# Patient Record
Sex: Male | Born: 1955
Health system: Southern US, Community
[De-identification: ages and names within clinical notes are randomized; demographics above are authoritative.]

## PROBLEM LIST (undated history)

## (undated) DIAGNOSIS — E119 Type 2 diabetes mellitus without complications: Secondary | ICD-10-CM

## (undated) DIAGNOSIS — I10 Essential (primary) hypertension: Secondary | ICD-10-CM

## (undated) DIAGNOSIS — L109 Pemphigus, unspecified: Secondary | ICD-10-CM

## (undated) DIAGNOSIS — E785 Hyperlipidemia, unspecified: Secondary | ICD-10-CM

## (undated) DIAGNOSIS — I2510 Atherosclerotic heart disease of native coronary artery without angina pectoris: Secondary | ICD-10-CM

## (undated) HISTORY — DX: Essential (primary) hypertension: I10

## (undated) HISTORY — DX: Type 2 diabetes mellitus without complications: E11.9

## (undated) HISTORY — DX: Hyperlipidemia, unspecified: E78.5

## (undated) HISTORY — PX: ROTATOR CUFF REPAIR: SHX139

## (undated) HISTORY — PX: TRIGGER FINGER RELEASE: SHX641

---

## 1977-11-14 HISTORY — PX: OTHER SURGICAL HISTORY: SHX169

## 2007-11-28 DIAGNOSIS — Z86018 Personal history of other benign neoplasm: Secondary | ICD-10-CM

## 2007-11-28 HISTORY — DX: Personal history of other benign neoplasm: Z86.018

## 2011-03-11 ENCOUNTER — Ambulatory Visit: Payer: Self-pay | Admitting: Internal Medicine

## 2011-03-15 ENCOUNTER — Ambulatory Visit: Payer: Self-pay | Admitting: Internal Medicine

## 2013-09-05 ENCOUNTER — Ambulatory Visit: Payer: Self-pay | Admitting: Orthopedic Surgery

## 2013-10-29 ENCOUNTER — Ambulatory Visit: Payer: Self-pay | Admitting: Orthopedic Surgery

## 2015-03-06 NOTE — Op Note (Signed)
PATIENT NAME:  RIGGS, DINEEN MR#:  [id number] DATE OF BIRTH:  [birth year]  DATE OF PROCEDURE:  11/01/2013  PREOPERATIVE DIAGNOSIS: Rotator cuff tear, impingement syndrome, biceps tendinitis/fraying right shoulder.   POSTOPERATIVE DIAGNOSIS: Rotator cuff tear, impingement syndrome, biceps tendinitis/fraying right shoulder.  PROCEDURE PERFORMED: Arthroscopic repair of rotator cuff, arthroscopic subacromial decompression and biceps tenotomy.   SURGEON: Dawayne Patricia, M.D.   ASSISTANT: Reche Dixon.   ANESTHESIA: Interscalene block and general.   ESTIMATED BLOOD LOSS: Minimal.   OPERATIVE FINDINGS: Full-thickness tear of the supraspinatus from the proximal edge of the rotator interval, subacromial spur, extensive bursitis, significant biceps tendinitis/fraying.  DRAINS: None.  IMPLANTS: Arthrex.   COMPLICATIONS: None.   INDICATIONS FOR PROCEDURE: This patient is a 59 year old gentleman who presented to my office with complaints of significant pain in the right shoulder and progressive weakness. MRI confirmed the presence of a significant tear of the anterior rotator cuff, with medial and posterior retraction. Risks and benefits were explained to the patient. The patient decided to proceed with surgical intervention.   DESCRIPTION OF PROCEDURE: The patient was identified in the preoperative holding area. Right shoulder was marked as the operative site. Interscalene block was administered. The patient was brought into the Operating Room and placed on the table in a supine position. General anesthesia was administered. The patient was secured in a beach chair position with head adequately positioned and secured. Adequate padding was provided.   The right upper extremity was prepared and draped in the usual sterile fashion. Timeout was performed.   Standard posterior viewing portal was made. Arthroscope was inserted and, under direct visualization, an anterior portal was made. The  patient was found to have very severe fraying of the biceps tendon, with significant injection of the tendon pulled into the shoulder. Biceps tenotomy was carried out, and the residual stump was trimmed back to the level of the labrum. The patient was found to have very significant fraying of the entire supraspinatus. Probe was readily able to be passed directly through the supraspinatus into the subacromial space. On motion of the arm, full-thickness tear was appreciated, with posterior and slight medial retraction. Subscapularis was intact and probed for security.   Gentle debridement of the torn rotator cuff was carried out. Anterior capsule was gently debrided of the extensive scar tissue that was noted. At this time, attention was turned to the subacromial space. The subacromial space demonstrated very significant bursitis. An extensive bursectomy was carried out. Subacromial decompression with acromioplasty was done, clearing adequate space for rotator cuff repair. The lateral edge of the rotator cuff tear was gently debrided of soft tissue. The humeral footprint was cleared of soft tissue, and a bleeding bone surface was achieved. At this time, a BioComposite corkscrew, 5.5 x 14.7 mm, was inserted into the anterior aspect of the medial footprint. This anchor was double loaded, and all four limbs of suture were passed through the anterior two thirds of the torn cuff. Sutures were shuttled through the anterior portal.   In a similar fashion, a second BioComposite corkscrew anchor was placed in the posterior aspect of the medial footprint. In the remaining one third of the torn portion of the tendon, one of the two suture limbs were passed and the other was pulled. The sutures were shuttled back to the lateral portal in pairs and carefully tied down. The rotator cuff very nicely reduced back to the footprint.   Decision was made to implant a BioComposite SwiveLock 4.75 mm x 19.1 mm  anchor into the lateral  aspect of the footprint to secure down the lateral edge of the cuff tear. All six sutures were passed through this SwiveLock anchor. The cuff was nicely laid down. At this time, the shoulder was taken through a range of motion to ensure security and complete repair of rotator cuff. The shoulder was thoroughly irrigated and drained. Arthroscope was removed. Skin was closed using 2-0 Vicryl and 3-0 nylon suture. Sterile dressings were applied. The patient was placed in a sling with an abduction pillow. He will follow up in my office in three days.    ____________________________ Dawayne Patricia, MD sr:cg D: 11/01/2013 16:38:39 ET T: 11/02/2013 01:22:00 ET JOB#: [id number]  cc: Dawayne Patricia, MD, <Dictator>

## 2015-03-07 NOTE — Op Note (Signed)
PATIENT NAME:  Hector Garcia, Hector Garcia MR#:  [id number] DATE OF BIRTH:  1955-12-06  DATE OF PROCEDURE:  10/29/2013  PREOPERATIVE DIAGNOSIS: Rotator cuff tear, impingement syndrome, biceps tendinitis/fraying right shoulder.   POSTOPERATIVE DIAGNOSIS: Rotator cuff tear, impingement syndrome, biceps tendinitis/fraying right shoulder.  PROCEDURE PERFORMED: Arthroscopic repair of rotator cuff, arthroscopic subacromial decompression and biceps tenotomy.   SURGEON: Dawayne Patricia, M.D.   ASSISTANT: Reche Dixon.   ANESTHESIA: Interscalene block and general.   ESTIMATED BLOOD LOSS: Minimal.   OPERATIVE FINDINGS: Full-thickness tear of the supraspinatus from the proximal edge of the rotator interval, subacromial spur, extensive bursitis, significant biceps tendinitis/fraying.  DRAINS: None.  IMPLANTS: Arthrex.   COMPLICATIONS: None.   INDICATIONS FOR PROCEDURE: This patient is a 59 year old gentleman who presented to my office with complaints of significant pain in the right shoulder and progressive weakness. MRI confirmed the presence of a significant tear of the anterior rotator cuff, with medial and posterior retraction. Risks and benefits were explained to the patient. The patient decided to proceed with surgical intervention.   DESCRIPTION OF PROCEDURE: The patient was identified in the preoperative holding area. Right shoulder was marked as the operative site. Interscalene block was administered. The patient was brought into the Operating Room and placed on the table in a supine position. General anesthesia was administered. The patient was secured in a beach chair position with head adequately positioned and secured. Adequate padding was provided.   The right upper extremity was prepared and draped in the usual sterile fashion. Timeout was performed.   Standard posterior viewing portal was made. Arthroscope was inserted and, under direct visualization, an anterior portal was made. The  patient was found to have very severe fraying of the biceps tendon, with significant injection of the tendon pulled into the shoulder. Biceps tenotomy was carried out, and the residual stump was trimmed back to the level of the labrum. The patient was found to have very significant fraying of the entire supraspinatus. Probe was readily able to be passed directly through the supraspinatus into the subacromial space. On motion of the arm, full-thickness tear was appreciated, with posterior and slight medial retraction. Subscapularis was intact and probed for security.   Gentle debridement of the torn rotator cuff was carried out. Anterior capsule was gently debrided of the extensive scar tissue that was noted. At this time, attention was turned to the subacromial space. The subacromial space demonstrated very significant bursitis. An extensive bursectomy was carried out. Subacromial decompression with acromioplasty was done, clearing adequate space for rotator cuff repair. The lateral edge of the rotator cuff tear was gently debrided of soft tissue. The humeral footprint was cleared of soft tissue, and a bleeding bone surface was achieved. At this time, a BioComposite corkscrew, 5.5 x 14.7 mm, was inserted into the anterior aspect of the medial footprint. This anchor was double loaded, and all four limbs of suture were passed through the anterior two thirds of the torn cuff. Sutures were shuttled through the anterior portal.   In a similar fashion, a second BioComposite corkscrew anchor was placed in the posterior aspect of the medial footprint. In the remaining one third of the torn portion of the tendon, one of the two suture limbs were passed and the other was pulled. The sutures were shuttled back to the lateral portal in pairs and carefully tied down. The rotator cuff very nicely reduced back to the footprint.   Decision was made to implant a BioComposite SwiveLock 4.75 mm x 19.1 mm  anchor into the lateral  aspect of the footprint to secure down the lateral edge of the cuff tear. All six sutures were passed through this SwiveLock anchor. The cuff was nicely laid down. At this time, the shoulder was taken through a range of motion to ensure security and complete repair of rotator cuff. The shoulder was thoroughly irrigated and drained. Arthroscope was removed. Skin was closed using 2-0 Vicryl and 3-0 nylon suture. Sterile dressings were applied. The patient was placed in a sling with an abduction pillow. He will follow up in my office in three days.  ____________________________ Dawayne Patricia, MD sr:cg D: 11/01/2013 16:38:00 ET T: 11/02/2013 01:22:00 ET JOB#: [id number] Dawayne Patricia MD ELECTRONICALLY SIGNED 12/05/2013 9:23

## 2016-06-03 ENCOUNTER — Ambulatory Visit: Admission: RE | Admit: 2016-06-03 | Payer: 59 | Source: Ambulatory Visit | Admitting: Gastroenterology

## 2016-06-03 ENCOUNTER — Encounter: Admission: RE | Payer: Self-pay | Source: Ambulatory Visit

## 2016-06-03 SURGERY — COLONOSCOPY WITH PROPOFOL
Anesthesia: General

## 2016-07-20 DIAGNOSIS — I10 Essential (primary) hypertension: Secondary | ICD-10-CM | POA: Insufficient documentation

## 2016-07-20 DIAGNOSIS — E119 Type 2 diabetes mellitus without complications: Secondary | ICD-10-CM | POA: Insufficient documentation

## 2016-07-20 DIAGNOSIS — E782 Mixed hyperlipidemia: Secondary | ICD-10-CM | POA: Insufficient documentation

## 2016-10-18 ENCOUNTER — Other Ambulatory Visit: Payer: Self-pay | Admitting: Internal Medicine

## 2016-10-18 DIAGNOSIS — M5116 Intervertebral disc disorders with radiculopathy, lumbar region: Secondary | ICD-10-CM

## 2016-10-21 ENCOUNTER — Ambulatory Visit: Admission: RE | Admit: 2016-10-21 | Payer: 59 | Source: Ambulatory Visit

## 2016-11-04 ENCOUNTER — Ambulatory Visit
Admission: RE | Admit: 2016-11-04 | Discharge: 2016-11-04 | Disposition: A | Discharge status: Home / Self Care | Payer: 59 | Source: Ambulatory Visit | Attending: Internal Medicine | Admitting: Internal Medicine

## 2016-11-04 DIAGNOSIS — M4807 Spinal stenosis, lumbosacral region: Secondary | ICD-10-CM | POA: Diagnosis not present

## 2016-11-04 DIAGNOSIS — M8938 Hypertrophy of bone, other site: Secondary | ICD-10-CM | POA: Insufficient documentation

## 2016-11-04 DIAGNOSIS — M5116 Intervertebral disc disorders with radiculopathy, lumbar region: Secondary | ICD-10-CM | POA: Diagnosis not present

## 2016-11-04 DIAGNOSIS — M5127 Other intervertebral disc displacement, lumbosacral region: Secondary | ICD-10-CM | POA: Insufficient documentation

## 2016-11-15 DIAGNOSIS — M5416 Radiculopathy, lumbar region: Secondary | ICD-10-CM | POA: Diagnosis not present

## 2017-01-13 DIAGNOSIS — R972 Elevated prostate specific antigen [PSA]: Secondary | ICD-10-CM | POA: Diagnosis not present

## 2017-01-13 DIAGNOSIS — E119 Type 2 diabetes mellitus without complications: Secondary | ICD-10-CM | POA: Diagnosis not present

## 2017-01-26 DIAGNOSIS — Z0000 Encounter for general adult medical examination without abnormal findings: Secondary | ICD-10-CM | POA: Diagnosis not present

## 2017-01-26 DIAGNOSIS — D369 Benign neoplasm, unspecified site: Secondary | ICD-10-CM | POA: Insufficient documentation

## 2017-01-26 DIAGNOSIS — E119 Type 2 diabetes mellitus without complications: Secondary | ICD-10-CM | POA: Diagnosis not present

## 2017-01-26 DIAGNOSIS — M519 Unspecified thoracic, thoracolumbar and lumbosacral intervertebral disc disorder: Secondary | ICD-10-CM | POA: Insufficient documentation

## 2017-03-21 ENCOUNTER — Encounter: Payer: Self-pay | Admitting: Urology

## 2017-03-21 ENCOUNTER — Ambulatory Visit: Payer: 59 | Admitting: Urology

## 2017-03-21 VITALS — BP 171/73 | HR 67 | Ht 73.0 in | Wt 227.6 lb

## 2017-03-21 DIAGNOSIS — N5201 Erectile dysfunction due to arterial insufficiency: Secondary | ICD-10-CM

## 2017-03-21 NOTE — Progress Notes (Signed)
03/21/2017 3:43 PM   Belva Bertin Jan 15, 1956 [id number]  Referring provider: Rusty Aus, MD [facility] [place], [place] [address]  Chief Complaint  Patient presents with  . Erectile Dysfunction    HPI: 61 year old male who presents today for further evaluation and management of erectile dysfunction. The patient states that he is concerned with erectile dysfunction for the last several years. He has tried both Viagra 100 mg and Cialis 20 mg many different times and has noted progressively worsening dysfunction. He has a history of diabetes, hypertension, and tobacco abuse, contributing to his symptoms. The patient states that he has a strong libido, has normal energy level, and no stigmata of low testosterone. Para the patient has done research on alternative options as well versed on a different modalities of erectile dysfunction management.     PMH: Past Medical History:  Diagnosis Date  . Diabetes mellitus without complication ([place])   . Hyperlipidemia   . Hypertension     Surgical History: Past Surgical History:  Procedure Laterality Date  . ROTATOR CUFF REPAIR    . TRIGGER FINGER RELEASE      Home Medications:  Allergies as of 03/21/2017   No Known Allergies     Medication List       Accurate as of 03/21/17  3:43 PM. Always use your most recent med list.          amLODipine 10 MG tablet Commonly known as:  NORVASC Take by mouth.   atorvastatin 20 MG tablet Commonly known as:  LIPITOR Take by mouth.   CHANTIX 0.5 MG tablet Generic drug:  varenicline Take by mouth.   DOCOSAHEXAENOIC ACID PO Take by mouth.   glimepiride 2 MG tablet Commonly known as:  AMARYL Take by mouth.   metFORMIN 500 MG tablet Commonly known as:  GLUCOPHAGE TAKE 2 TABLETS BY MOUTH 2 TIMES DAILY WITH MEALS.   omeprazole 40 MG capsule Commonly known as:  PRILOSEC Take by mouth.   zolpidem 10 MG tablet Commonly known as:   AMBIEN TAKE 1 TABLET BY MOUTH NIGHTLY AS NEEDED FOR SLEEP       Allergies: No Known Allergies  Family History: Family History  Problem Relation Age of Onset  . Prostate cancer Neg Hx   . Bladder Cancer Neg Hx   . Kidney cancer Neg Hx     Social History:  reports that he has quit smoking. He has never used smokeless tobacco. He reports that he drinks alcohol. He reports that he does not use drugs.  ROS: UROLOGY Frequent Urination?: No Hard to postpone urination?: No Burning/pain with urination?: No Get up at night to urinate?: No Leakage of urine?: No Urine stream starts and stops?: Yes Trouble starting stream?: Yes Do you have to strain to urinate?: No Blood in urine?: No Urinary tract infection?: No Sexually transmitted disease?: No Injury to kidneys or bladder?: No Painful intercourse?: No Weak stream?: No Erection problems?: No  Gastrointestinal Nausea?: No Vomiting?: No Indigestion/heartburn?: No Diarrhea?: No Constipation?: No  Constitutional Fever: No Night sweats?: No Weight loss?: No Fatigue?: No  Skin Skin rash/lesions?: No Itching?: No  Eyes Blurred vision?: No Double vision?: No  Ears/Nose/Throat Sore throat?: No Sinus problems?: Yes  Hematologic/Lymphatic Swollen glands?: No Easy bruising?: No  Cardiovascular Leg swelling?: No Chest pain?: No  Respiratory Cough?: No Shortness of breath?: No  Endocrine Excessive thirst?: No  Musculoskeletal Back pain?: No Joint pain?: No  Neurological Headaches?: No Dizziness?:  No  Psychologic Depression?: No Anxiety?: No  Physical Exam: BP (!) 171/73 (BP Location: Left Arm, Patient Position: Sitting, Cuff Size: Normal)   Pulse 67   Ht 6\' 1"  (1.854 m)   Wt 103.2 kg (227 lb 9.6 oz)   BMI 30.03 kg/m   Constitutional:  Alert and oriented, No acute distress. HEENT: [place] AT, moist mucus membranes.  Trachea midline, no masses. Cardiovascular: No clubbing, cyanosis, or  edema. Respiratory: Normal respiratory effort, no increased work of breathing. GI: Abdomen is soft, nontender, nondistended, no abdominal masses GU: No CVA tenderness. Skin: No rashes, bruises or suspicious lesions. Lymph: No cervical or inguinal adenopathy. Neurologic: Grossly intact, no focal deficits, moving all 4 extremities. Psychiatric: Normal mood and affect.  Laboratory Data: No results found for: WBC, HGB, HCT, MCV, PLT  No results found for: CREATININE  No results found for: PSA  No results found for: TESTOSTERONE  No results found for: HGBA1C  Urinalysis No results found for: COLORURINE, APPEARANCEUR, LABSPEC, PHURINE, GLUCOSEU, HGBUR, BILIRUBINUR, KETONESUR, PROTEINUR, UROBILINOGEN, NITRITE, LEUKOCYTESUR  Pertinent Imaging: None  Assessment & Plan:  The patient has erectile dysfunction likely of arterial genic origin. We discussed the treatment options for him including Viagra/Cialis with a penile occlusive ring, vacuum erection device, Muse intraurethral suppository, and intra-corporal injections. We discussed the pros and cons of each modality. I told him that the most effective and cheapest option of all of these would likely be the injections. I went over the process of titrating the medication and the general procedure for injections. We discussed the risks of priapism. Our plan at this point is to have the patient pick up a prescription of tri-mix and return for injection teaching. We'll then plan to follow up with him on annual basis, sooner if he needs further help persistence.  There are no diagnoses linked to this encounter.  Return in about 1 week (around 03/28/2017) for injection teaching with [place].  Ardis Hughs, [facility] [address], [place],  [address] [phone]

## 2017-03-27 NOTE — Progress Notes (Signed)
03/29/2017 9:10 AM   Hector Garcia 03-Jul-1956 [id number]  Referring provider: Rusty Aus, MD [facility] [place], [place] [address]  Chief Complaint  Patient presents with  . Erectile Dysfunction    Trimix teaching    HPI: [name] who presents today for a Trimix titration.  Background history 61 year old male who presents today for further evaluation and management of erectile dysfunction. The patient states that he is concerned with erectile dysfunction for the last several years. He has tried both Viagra 100 mg and Cialis 20 mg many different times and has noted progressively worsening dysfunction. He has a history of diabetes, hypertension, and tobacco abuse, contributing to his symptoms. The patient states that he has a strong libido, has normal energy level, and no stigmata of low testosterone. Para the patient has done research on alternative options as well versed on a different modalities of erectile dysfunction management.    Today, his BP was elevated.     PMH: Past Medical History:  Diagnosis Date  . Diabetes mellitus without complication ([place])   . Hyperlipidemia   . Hypertension     Surgical History: Past Surgical History:  Procedure Laterality Date  . ROTATOR CUFF REPAIR    . TRIGGER FINGER RELEASE      Home Medications:  Allergies as of 03/29/2017   No Known Allergies     Medication List       Accurate as of 03/29/17  9:10 AM. Always use your most recent med list.          amLODipine 10 MG tablet Commonly known as:  NORVASC Take by mouth.   atorvastatin 20 MG tablet Commonly known as:  LIPITOR Take by mouth.   CHANTIX 0.5 MG tablet Generic drug:  varenicline Take by mouth.   DOCOSAHEXAENOIC ACID PO Take by mouth.   glimepiride 2 MG tablet Commonly known as:  AMARYL Take by mouth.   metFORMIN 500 MG tablet Commonly known as:  GLUCOPHAGE TAKE 2 TABLETS BY MOUTH 2 TIMES DAILY WITH  MEALS.   omeprazole 40 MG capsule Commonly known as:  PRILOSEC Take by mouth.   zolpidem 10 MG tablet Commonly known as:  AMBIEN TAKE 1 TABLET BY MOUTH NIGHTLY AS NEEDED FOR SLEEP       Allergies: No Known Allergies  Family History: Family History  Problem Relation Age of Onset  . Prostate cancer Neg Hx   . Bladder Cancer Neg Hx   . Kidney cancer Neg Hx     Social History:  reports that he quit smoking about 4 months ago. He has never used smokeless tobacco. He reports that he drinks alcohol. He reports that he does not use drugs.  ROS: UROLOGY Frequent Urination?: No Hard to postpone urination?: No Burning/pain with urination?: No Get up at night to urinate?: No Leakage of urine?: No Urine stream starts and stops?: No Trouble starting stream?: No Do you have to strain to urinate?: No Blood in urine?: No Urinary tract infection?: No Sexually transmitted disease?: No Injury to kidneys or bladder?: No Painful intercourse?: No Weak stream?: No Erection problems?: Yes Penile pain?: No  Gastrointestinal Nausea?: No Vomiting?: No Indigestion/heartburn?: No Diarrhea?: No Constipation?: No  Constitutional Fever: No Night sweats?: No Weight loss?: No Fatigue?: No  Skin Skin rash/lesions?: No Itching?: No  Eyes Blurred vision?: No Double vision?: No  Ears/Nose/Throat Sore throat?: No Sinus problems?: No  Hematologic/Lymphatic Swollen glands?: No Easy bruising?: No  Cardiovascular  Leg swelling?: No Chest pain?: No  Respiratory Cough?: No Shortness of breath?: No  Endocrine Excessive thirst?: No  Musculoskeletal Back pain?: No Joint pain?: No  Neurological Headaches?: No Dizziness?: No  Psychologic Depression?: No Anxiety?: No  Physical Exam: BP (!) 194/95 (BP Location: Left Arm, Cuff Size: Normal)   Pulse 79   Ht 6\' 1"  (1.854 m)   Wt 226 lb 1.6 oz (102.6 kg)   BMI 29.83 kg/m   Constitutional: Well nourished. Alert and  oriented, No acute distress. HEENT: [place] AT, moist mucus membranes. Trachea midline, no masses. Cardiovascular: No clubbing, cyanosis, or edema. Respiratory: Normal respiratory effort, no increased work of breathing. Skin: No rashes, bruises or suspicious lesions. Lymph: No cervical or inguinal adenopathy. Neurologic: Grossly intact, no focal deficits, moving all 4 extremities. Psychiatric: Normal mood and affect.     Assessment & Plan  1. Erectile dysfunction  - Patient could not receive Trimix injection today due to uncontrolled hypertension  - Patient will return tomorrow morning for second attempt  2. Uncontrolled HTN  - Patient will return to his primary care provider to see if his blood pressure medication can be increased so that we can safely administered Trimix injection  Return for RTC tomorrow am for Trimix titration.  Zara Council, [facility] [address] [place], [place] [address] [phone]

## 2017-03-29 ENCOUNTER — Ambulatory Visit (INDEPENDENT_AMBULATORY_CARE_PROVIDER_SITE_OTHER): Payer: 59 | Admitting: Urology

## 2017-03-29 ENCOUNTER — Encounter: Payer: Self-pay | Admitting: Urology

## 2017-03-29 VITALS — BP 194/95 | HR 79 | Ht 73.0 in | Wt 226.1 lb

## 2017-03-29 DIAGNOSIS — N529 Male erectile dysfunction, unspecified: Secondary | ICD-10-CM

## 2017-03-29 DIAGNOSIS — I10 Essential (primary) hypertension: Secondary | ICD-10-CM

## 2017-03-30 ENCOUNTER — Ambulatory Visit (INDEPENDENT_AMBULATORY_CARE_PROVIDER_SITE_OTHER): Payer: 59 | Admitting: Urology

## 2017-03-30 ENCOUNTER — Encounter: Payer: Self-pay | Admitting: Urology

## 2017-03-30 VITALS — BP 202/87 | HR 71 | Ht 68.0 in | Wt 226.5 lb

## 2017-03-30 DIAGNOSIS — I10 Essential (primary) hypertension: Secondary | ICD-10-CM | POA: Diagnosis not present

## 2017-03-30 DIAGNOSIS — N529 Male erectile dysfunction, unspecified: Secondary | ICD-10-CM

## 2017-03-30 NOTE — Progress Notes (Addendum)
03/30/2017 8:54 AM   Hector Garcia July 27, 1956 [id number]  Referring provider: Rusty Aus, MD [facility] [place], [place] [address]  Chief Complaint  Patient presents with  . Erectile Dysfunction    Trimix    HPI: [name] who presents today for a Trimix titration.  Background history 61 year old male who presents today for further evaluation and management of erectile dysfunction. The patient states that he is concerned with erectile dysfunction for the last several years. He has tried both Viagra 100 mg and Cialis 20 mg many different times and has noted progressively worsening dysfunction. He has a history of diabetes, hypertension, and tobacco abuse, contributing to his symptoms. The patient states that he has a strong libido, has normal energy level, and no stigmata of low testosterone. Para the patient has done research on alternative options as well versed on a different modalities of erectile dysfunction management.    Today, patient still with high BP.  He was unable to get in contact with his PCP.     PMH: Past Medical History:  Diagnosis Date  . Diabetes mellitus without complication ([place])   . Hyperlipidemia   . Hypertension     Surgical History: Past Surgical History:  Procedure Laterality Date  . ROTATOR CUFF REPAIR    . TRIGGER FINGER RELEASE      Home Medications:  Allergies as of 03/30/2017   No Known Allergies     Medication List       Accurate as of 03/30/17  8:54 AM. Always use your most recent med list.          amLODipine 10 MG tablet Commonly known as:  NORVASC Take by mouth.   atorvastatin 20 MG tablet Commonly known as:  LIPITOR Take by mouth.   CHANTIX 0.5 MG tablet Generic drug:  varenicline Take by mouth.   DOCOSAHEXAENOIC ACID PO Take by mouth.   glimepiride 2 MG tablet Commonly known as:  AMARYL Take by mouth.   metFORMIN 500 MG tablet Commonly known as:   GLUCOPHAGE TAKE 2 TABLETS BY MOUTH 2 TIMES DAILY WITH MEALS.   omeprazole 40 MG capsule Commonly known as:  PRILOSEC Take by mouth.   zolpidem 10 MG tablet Commonly known as:  AMBIEN TAKE 1 TABLET BY MOUTH NIGHTLY AS NEEDED FOR SLEEP       Allergies: No Known Allergies  Family History: Family History  Problem Relation Age of Onset  . Prostate cancer Neg Hx   . Bladder Cancer Neg Hx   . Kidney cancer Neg Hx     Social History:  reports that he quit smoking about 4 months ago. He has never used smokeless tobacco. He reports that he drinks alcohol. He reports that he does not use drugs.   Physical Exam: BP (!) 202/87   Pulse 71   Ht 5\' 8"  (1.727 m)   Wt 226 lb 8 oz (102.7 kg)   BMI 34.44 kg/m   Constitutional: Well nourished. Alert and oriented, No acute distress. HEENT: [place] AT, moist mucus membranes. Trachea midline, no masses. Cardiovascular: No clubbing, cyanosis, or edema. Respiratory: Normal respiratory effort, no increased work of breathing. Skin: No rashes, bruises or suspicious lesions. Lymph: No cervical or inguinal adenopathy. Neurologic: Grossly intact, no focal deficits, moving all 4 extremities. Psychiatric: Normal mood and affect.      Assessment & Plan  1. Erectile dysfunction  - Patient could not receive Trimix injection today due to uncontrolled  hypertension  - Patient advised to contact PCP's office for further management  2. Uncontrolled HTN  - Patient will return to his primary care provider   Return for RTC once HTN is controlled .  Zara Council, [facility] [address], [place], [place] [address] [phone]

## 2017-03-31 DIAGNOSIS — I10 Essential (primary) hypertension: Secondary | ICD-10-CM | POA: Diagnosis not present

## 2017-04-06 DIAGNOSIS — J020 Streptococcal pharyngitis: Secondary | ICD-10-CM | POA: Diagnosis not present

## 2017-04-20 DIAGNOSIS — L570 Actinic keratosis: Secondary | ICD-10-CM | POA: Diagnosis not present

## 2017-04-20 DIAGNOSIS — Z1283 Encounter for screening for malignant neoplasm of skin: Secondary | ICD-10-CM | POA: Diagnosis not present

## 2017-04-20 DIAGNOSIS — Z85828 Personal history of other malignant neoplasm of skin: Secondary | ICD-10-CM | POA: Diagnosis not present

## 2017-04-20 DIAGNOSIS — D485 Neoplasm of uncertain behavior of skin: Secondary | ICD-10-CM | POA: Diagnosis not present

## 2017-04-25 DIAGNOSIS — R42 Dizziness and giddiness: Secondary | ICD-10-CM | POA: Diagnosis not present

## 2017-04-25 DIAGNOSIS — E860 Dehydration: Secondary | ICD-10-CM | POA: Diagnosis not present

## 2017-04-25 DIAGNOSIS — E11649 Type 2 diabetes mellitus with hypoglycemia without coma: Secondary | ICD-10-CM | POA: Diagnosis not present

## 2017-04-25 DIAGNOSIS — R61 Generalized hyperhidrosis: Secondary | ICD-10-CM | POA: Diagnosis not present

## 2017-05-07 NOTE — Progress Notes (Signed)
05/08/2017 8:39 AM   Hector Garcia Nov 23, 1955 [id number]  Referring provider: Rusty Aus, MD [facility] [place], [place] [address]  Chief Complaint  Patient presents with  . Erectile Dysfunction    Trimix teaching    HPI: [name] who presents today for a Trimix titration.  Background history 61 year old male who presents today for further evaluation and management of erectile dysfunction. The patient states that he is concerned with erectile dysfunction for the last several years. He has tried both Viagra 100 mg and Cialis 20 mg many different times and has noted progressively worsening dysfunction. He has a history of diabetes, hypertension, and tobacco abuse, contributing to his symptoms. The patient states that he has a strong libido, has normal energy level, and no stigmata of low testosterone. Para the patient has done research on alternative options as well versed on a different modalities of erectile dysfunction management.    He has seen Dr. Emily Filbert concerning his BP.   His BP with [place] today 178/78.  He states he walked his dog this morning.  I had the patient leave the office and return an hour later. Blood pressure was still elevated and it was not safe to pursue Trimix titration at this visit   PMH: Past Medical History:  Diagnosis Date  . Diabetes mellitus without complication ([place])   . Hyperlipidemia   . Hypertension     Surgical History: Past Surgical History:  Procedure Laterality Date  . ROTATOR CUFF REPAIR    . TRIGGER FINGER RELEASE     x several    Home Medications:  Allergies as of 05/08/2017   No Known Allergies     Medication List       Accurate as of 05/08/17  8:39 AM. Always use your most recent med list.          amLODipine 10 MG tablet Commonly known as:  NORVASC Take by mouth.   atorvastatin 20 MG tablet Commonly known as:  LIPITOR Take by mouth.   DOCOSAHEXAENOIC ACID  PO Take by mouth.   Flaxseed Oil Oil Use.   glimepiride 2 MG tablet Commonly known as:  AMARYL Take by mouth.   loratadine 10 MG tablet Commonly known as:  CLARITIN Take by mouth.   metFORMIN 500 MG tablet Commonly known as:  GLUCOPHAGE TAKE 2 TABLETS BY MOUTH 2 TIMES DAILY WITH MEALS.   omeprazole 40 MG capsule Commonly known as:  PRILOSEC Take by mouth.   oxymetazoline 0.05 % nasal spray Commonly known as:  AFRIN by Nasal route.   sildenafil 100 MG tablet Commonly known as:  VIAGRA TAKE 1 TABLET BY MOUTH DAILY   zolpidem 10 MG tablet Commonly known as:  AMBIEN TAKE 1 TABLET BY MOUTH NIGHTLY AS NEEDED FOR SLEEP       Allergies: No Known Allergies  Family History: Family History  Problem Relation Age of Onset  . Benign prostatic hyperplasia Brother   . Prostate cancer Neg Hx   . Bladder Cancer Neg Hx   . Kidney cancer Neg Hx     Social History:  reports that he quit smoking about 5 months ago. He has never used smokeless tobacco. He reports that he drinks alcohol. He reports that he does not use drugs.   Physical Exam: BP (!) 188/79   Pulse 87   Ht 6\' 1"  (1.854 m)   Wt 233 lb 6.4 oz (105.9 kg)   BMI 30.79 kg/m  Constitutional: Well nourished. Alert and oriented, No acute distress. HEENT: [place] AT, moist mucus membranes. Trachea midline, no masses. Cardiovascular: No clubbing, cyanosis, or edema. Respiratory: Normal respiratory effort, no increased work of breathing. Skin: No rashes, bruises or suspicious lesions. Lymph: No cervical or inguinal adenopathy. Neurologic: Grossly intact, no focal deficits, moving all 4 extremities. Psychiatric: Normal mood and affect.   Assessment & Plan  1. Erectile dysfunction  - patient could not have Trimix titration due to elevated HTN   - Advised patient of the condition of priapism, painful erection lasting for more than four hours, and to contact the office immediately or seek treatment in the ED  2.  Uncontrolled HTN  - Patient will return to his primary care provider   No Follow-up on file.  Zara Council, [facility] [address], [place], [place] [address] [phone]

## 2017-05-08 ENCOUNTER — Ambulatory Visit (INDEPENDENT_AMBULATORY_CARE_PROVIDER_SITE_OTHER): Payer: 59 | Admitting: Urology

## 2017-05-08 ENCOUNTER — Encounter: Payer: Self-pay | Admitting: Urology

## 2017-05-08 VITALS — BP 179/78 | HR 87 | Ht 73.0 in | Wt 233.4 lb

## 2017-05-08 DIAGNOSIS — N529 Male erectile dysfunction, unspecified: Secondary | ICD-10-CM

## 2017-05-08 DIAGNOSIS — I10 Essential (primary) hypertension: Secondary | ICD-10-CM

## 2017-05-09 DIAGNOSIS — G5601 Carpal tunnel syndrome, right upper limb: Secondary | ICD-10-CM | POA: Diagnosis not present

## 2017-05-09 DIAGNOSIS — I10 Essential (primary) hypertension: Secondary | ICD-10-CM | POA: Diagnosis not present

## 2017-05-11 ENCOUNTER — Telehealth: Payer: Self-pay | Admitting: Urology

## 2017-05-11 NOTE — Telephone Encounter (Signed)
Patient notified and is currently working with Dr. Sabra Heck to resolve his BP

## 2017-05-11 NOTE — Telephone Encounter (Signed)
Please let Hector Garcia know that I have spoken to Dr. Erlene Quan and the best thing to do is work with Dr. Emily Filbert to get his BP under control.

## 2017-05-30 NOTE — Progress Notes (Signed)
05/31/2017 8:49 AM   Hector Garcia [birth year] [id number]  Referring provider: Rusty Aus, MD [facility] [place], [place] [address]  Chief Complaint  Patient presents with  . Erectile Dysfunction    Trimix teaching    HPI: [name] who presents today for a Trimix titration.  Background history 61 year old male who presents today for further evaluation and management of erectile dysfunction. The patient states that he is concerned with erectile dysfunction for the last several years. He has tried both Viagra 100 mg and Cialis 20 mg many different times and has noted progressively worsening dysfunction. He has a history of diabetes, hypertension, and tobacco abuse, contributing to his symptoms. The patient states that he has a strong libido, has normal energy level, and no stigmata of low testosterone. Per the patient has done research on alternative options as well versed on a different modalities of erectile dysfunction management.    Unfortunately, I could not proceed with titration of the tri-mix due to the medication being expired.    PMH: Past Medical History:  Diagnosis Date  . Diabetes mellitus without complication ([place])   . Hyperlipidemia   . Hypertension     Surgical History: Past Surgical History:  Procedure Laterality Date  . fisture surgery  1979  . ROTATOR CUFF REPAIR    . TRIGGER FINGER RELEASE     x several    Home Medications:  Allergies as of 05/31/2017   No Known Allergies     Medication List       Accurate as of 05/31/17  8:49 AM. Always use your most recent med list.          amLODipine 10 MG tablet Commonly known as:  NORVASC Take by mouth.   atorvastatin 20 MG tablet Commonly known as:  LIPITOR Take by mouth.   DOCOSAHEXAENOIC ACID PO Take by mouth.   doxazosin 4 MG tablet Commonly known as:  CARDURA Take by mouth.   Flaxseed Oil Oil Use.   glimepiride 2 MG tablet Commonly  known as:  AMARYL Take by mouth.   loratadine 10 MG tablet Commonly known as:  CLARITIN Take by mouth.   meloxicam 7.5 MG tablet Commonly known as:  MOBIC Take by mouth.   metFORMIN 500 MG tablet Commonly known as:  GLUCOPHAGE TAKE 2 TABLETS BY MOUTH 2 TIMES DAILY WITH MEALS.   omeprazole 40 MG capsule Commonly known as:  PRILOSEC Take by mouth.   oxymetazoline 0.05 % nasal spray Commonly known as:  AFRIN by Nasal route.   sildenafil 100 MG tablet Commonly known as:  VIAGRA TAKE 1 TABLET BY MOUTH DAILY   vitamin C 500 MG tablet Commonly known as:  ASCORBIC ACID Take by mouth.   VITAMIN D-1000 MAX ST 1000 units tablet Generic drug:  Cholecalciferol Take by mouth.   zolpidem 10 MG tablet Commonly known as:  AMBIEN TAKE 1 TABLET BY MOUTH NIGHTLY AS NEEDED FOR SLEEP       Allergies: No Known Allergies  Family History: Family History  Problem Relation Age of Onset  . Benign prostatic hyperplasia Brother   . Prostate cancer Neg Hx   . Bladder Cancer Neg Hx   . Kidney cancer Neg Hx     Social History:  reports that he quit smoking about 6 months ago. He has never used smokeless tobacco. He reports that he drinks alcohol. He reports that he does not use drugs.   Physical Exam: BP  131/73   Pulse 77   Ht 6\' 1"  (1.854 m)   Wt 229 lb 3.2 oz (104 kg)   BMI 30.24 kg/m      Assessment & Plan  1. Erectile dysfunction  - patient could not have Trimix titration due to medication being expired.    - will [facility] for [facility] medication  Return for RTC for Trimix titration.  Zara Council, [facility] [address], [place], [place] [address] [phone]

## 2017-05-31 ENCOUNTER — Ambulatory Visit (INDEPENDENT_AMBULATORY_CARE_PROVIDER_SITE_OTHER): Payer: 59 | Admitting: Urology

## 2017-05-31 ENCOUNTER — Encounter: Payer: Self-pay | Admitting: Urology

## 2017-05-31 VITALS — BP 131/73 | HR 77 | Ht 73.0 in | Wt 229.2 lb

## 2017-05-31 DIAGNOSIS — N529 Male erectile dysfunction, unspecified: Secondary | ICD-10-CM

## 2017-05-31 NOTE — Progress Notes (Signed)
Patient vial of Trimix expired. Cassandra to call [facility] to refill med and appointment will be made next week for Teaching.

## 2017-06-06 NOTE — Progress Notes (Signed)
06/07/2017 11:32 AM   Hector Garcia 17-Jan-1956 [id number]  Referring provider: Rusty Aus, MD [facility] [place], [place] [address]  Chief Complaint  Patient presents with  . Erectile Dysfunction    Trimix injection    HPI: [name] who presents today for a Trimix titration.  Background history 60 year old male who presents today for further evaluation and management of erectile dysfunction. The patient states that he is concerned with erectile dysfunction for the last several years. He has tried both Viagra 100 mg and Cialis 20 mg many different times and has noted progressively worsening dysfunction. He has a history of diabetes, hypertension, and tobacco abuse, contributing to his symptoms. The patient states that he has a strong libido, has normal energy level, and no stigmata of low testosterone. Per the patient has done research on alternative options as well versed on a different modalities of erectile dysfunction management.     He presents today for a Trimix titration.  His questions are answered.  He is ready to proceed.     PMH: Past Medical History:  Diagnosis Date  . Diabetes mellitus without complication ([place])   . Hyperlipidemia   . Hypertension     Surgical History: Past Surgical History:  Procedure Laterality Date  . fisture surgery  1979  . ROTATOR CUFF REPAIR    . TRIGGER FINGER RELEASE     x several    Home Medications:  Allergies as of 06/07/2017   No Known Allergies     Medication List       Accurate as of 06/07/17 11:32 AM. Always use your most recent med list.          amLODipine 10 MG tablet Commonly known as:  NORVASC Take by mouth.   atorvastatin 20 MG tablet Commonly known as:  LIPITOR Take by mouth.   DOCOSAHEXAENOIC ACID PO Take by mouth.   doxazosin 4 MG tablet Commonly known as:  CARDURA Take by mouth.   Flaxseed Oil Oil Use.   glimepiride 2 MG tablet Commonly  known as:  AMARYL Take by mouth.   loratadine 10 MG tablet Commonly known as:  CLARITIN Take by mouth.   meloxicam 7.5 MG tablet Commonly known as:  MOBIC Take by mouth.   metFORMIN 500 MG tablet Commonly known as:  GLUCOPHAGE TAKE 2 TABLETS BY MOUTH 2 TIMES DAILY WITH MEALS.   omeprazole 40 MG capsule Commonly known as:  PRILOSEC Take by mouth.   oxymetazoline 0.05 % nasal spray Commonly known as:  AFRIN by Nasal route.   sildenafil 100 MG tablet Commonly known as:  VIAGRA TAKE 1 TABLET BY MOUTH DAILY   tadalafil 10 MG tablet Commonly known as:  CIALIS Take by mouth.   vitamin C 500 MG tablet Commonly known as:  ASCORBIC ACID Take by mouth.   VITAMIN D-1000 MAX ST 1000 units tablet Generic drug:  Cholecalciferol Take by mouth.   zolpidem 10 MG tablet Commonly known as:  AMBIEN TAKE 1 TABLET BY MOUTH NIGHTLY AS NEEDED FOR SLEEP       Allergies: No Known Allergies  Family History: Family History  Problem Relation Age of Onset  . Benign prostatic hyperplasia Brother   . Prostate cancer Neg Hx   . Bladder Cancer Neg Hx   . Kidney cancer Neg Hx     Social History:  reports that he quit smoking about 6 months ago. He has never used smokeless tobacco. He reports  that he drinks alcohol. He reports that he does not use drugs.   Physical Exam: BP 132/72   Pulse 84   Ht 6\' 1"  (1.854 m)   Wt 227 lb 14.4 oz (103.4 kg)   BMI 30.07 kg/m   Constitutional: Well nourished. Alert and oriented, No acute distress. HEENT: [place] AT, moist mucus membranes. Trachea midline, no masses. Cardiovascular: No clubbing, cyanosis, or edema. Respiratory: Normal respiratory effort, no increased work of breathing. GI: Abdomen is soft, non tender, non distended, no abdominal masses. Liver and spleen not palpable.  No hernias appreciated.  Stool sample for occult testing is not indicated.   GU: No CVA tenderness.  No bladder fullness or masses.  Patient with circumcised phallus.  Urethral meatus is patent.  No penile discharge. No penile lesions or rashes. Skin: No rashes, bruises or suspicious lesions. Lymph: No cervical or inguinal adenopathy. Neurologic: Grossly intact, no focal deficits, moving all 4 extremities. Psychiatric: Normal mood and affect.   Procedure Patient's left corpus cavernosum is identified.  An area near the base of the penis is cleansed with rubbing alcohol.  Careful to avoid the dorsal vein, 1 mcg of Trimix is injected at a 90 degree angle into the left corpus cavernosum near the base of the penis.  Patient experienced a semi firm erection in 15 minutes.  Then 1 mcg of Trimix is injected into his right corpus cavernosum and he experienced a semi firm erection in 15 minutes.   Then 1 mcg of Trimix is injected into his left corpus cavernosum and he experienced a semi firm erection.  Then 1 mcg of Trimix is injected into his right corpus cavernosum and he experienced a firmer erection   Trimix Lot # [id number]   Exp:  06/08/2017   Assessment & Plan  1. Erectile dysfunction  - after 4 mcg of Trimix- patient experienced a firm erection but it was not firm enough for penetration- after discussion it was decided to have the patient inject 5 mcg at home with next injection in the next few days - he will report results  - Advised patient of the condition of priapism, painful erection lasting for more than four hours, and to contact the office immediately or seek treatment in the ED  Return for pateint to call .  06/21/2017, [facility] [address], [place], [place] [phone]

## 2017-06-07 ENCOUNTER — Encounter: Payer: Self-pay | Admitting: Urology

## 2017-06-07 ENCOUNTER — Ambulatory Visit (INDEPENDENT_AMBULATORY_CARE_PROVIDER_SITE_OTHER): Payer: 59 | Admitting: Urology

## 2017-06-07 VITALS — BP 132/72 | HR 84 | Ht 73.0 in | Wt 227.9 lb

## 2017-06-07 DIAGNOSIS — N529 Male erectile dysfunction, unspecified: Secondary | ICD-10-CM

## 2017-06-26 DIAGNOSIS — G5602 Carpal tunnel syndrome, left upper limb: Secondary | ICD-10-CM | POA: Diagnosis not present

## 2017-06-26 DIAGNOSIS — G5601 Carpal tunnel syndrome, right upper limb: Secondary | ICD-10-CM | POA: Diagnosis not present

## 2017-06-26 DIAGNOSIS — G5603 Carpal tunnel syndrome, bilateral upper limbs: Secondary | ICD-10-CM | POA: Diagnosis not present

## 2017-06-27 ENCOUNTER — Telehealth: Payer: Self-pay | Admitting: Urology

## 2017-06-27 NOTE — Telephone Encounter (Signed)
We can call in the prescription to [facility].  He can either have prefilled syringes or the vial.  He can discuss this further with [facility] to see which on would suit his needs better.   His dose is 9 mcg.

## 2017-06-27 NOTE — Telephone Encounter (Signed)
Patient called today and said that the 5mg  that you suggested he try did not work so he started injecting 8 and 9mg  and now he is out of Trimix and wants you to call in more medication.   Does he need to come back in or can you just call him in more medication? He would like a call back.  Thanks, Sharyn Lull

## 2017-06-28 NOTE — Telephone Encounter (Signed)
Medication called into [facility]. 

## 2017-07-04 DIAGNOSIS — M5416 Radiculopathy, lumbar region: Secondary | ICD-10-CM | POA: Diagnosis not present

## 2017-07-10 ENCOUNTER — Other Ambulatory Visit: Payer: Self-pay | Admitting: Neurological Surgery

## 2017-07-10 DIAGNOSIS — G5602 Carpal tunnel syndrome, left upper limb: Secondary | ICD-10-CM | POA: Diagnosis not present

## 2017-07-10 DIAGNOSIS — G5601 Carpal tunnel syndrome, right upper limb: Secondary | ICD-10-CM | POA: Diagnosis not present

## 2017-07-10 DIAGNOSIS — M5416 Radiculopathy, lumbar region: Secondary | ICD-10-CM

## 2017-07-14 ENCOUNTER — Ambulatory Visit
Admission: RE | Admit: 2017-07-14 | Discharge: 2017-07-14 | Disposition: A | Discharge status: Home / Self Care | Payer: 59 | Source: Ambulatory Visit | Attending: Neurological Surgery | Admitting: Neurological Surgery

## 2017-07-14 DIAGNOSIS — M48061 Spinal stenosis, lumbar region without neurogenic claudication: Secondary | ICD-10-CM | POA: Diagnosis not present

## 2017-07-14 DIAGNOSIS — M5416 Radiculopathy, lumbar region: Secondary | ICD-10-CM | POA: Insufficient documentation

## 2017-07-14 DIAGNOSIS — M545 Low back pain: Secondary | ICD-10-CM | POA: Diagnosis not present

## 2017-07-18 DIAGNOSIS — M5416 Radiculopathy, lumbar region: Secondary | ICD-10-CM | POA: Diagnosis not present

## 2017-07-20 DIAGNOSIS — M5442 Lumbago with sciatica, left side: Secondary | ICD-10-CM | POA: Diagnosis not present

## 2017-07-20 DIAGNOSIS — G8929 Other chronic pain: Secondary | ICD-10-CM | POA: Diagnosis not present

## 2017-07-24 DIAGNOSIS — Z0000 Encounter for general adult medical examination without abnormal findings: Secondary | ICD-10-CM | POA: Diagnosis not present

## 2017-07-31 DIAGNOSIS — M519 Unspecified thoracic, thoracolumbar and lumbosacral intervertebral disc disorder: Secondary | ICD-10-CM | POA: Diagnosis not present

## 2017-07-31 DIAGNOSIS — Z23 Encounter for immunization: Secondary | ICD-10-CM | POA: Diagnosis not present

## 2017-07-31 DIAGNOSIS — E119 Type 2 diabetes mellitus without complications: Secondary | ICD-10-CM | POA: Diagnosis not present

## 2017-08-04 DIAGNOSIS — G5601 Carpal tunnel syndrome, right upper limb: Secondary | ICD-10-CM | POA: Diagnosis not present

## 2017-08-11 DIAGNOSIS — G5603 Carpal tunnel syndrome, bilateral upper limbs: Secondary | ICD-10-CM | POA: Diagnosis not present

## 2017-08-17 DIAGNOSIS — M5416 Radiculopathy, lumbar region: Secondary | ICD-10-CM | POA: Diagnosis not present

## 2017-08-17 DIAGNOSIS — M5136 Other intervertebral disc degeneration, lumbar region: Secondary | ICD-10-CM | POA: Diagnosis not present

## 2017-08-18 DIAGNOSIS — G5601 Carpal tunnel syndrome, right upper limb: Secondary | ICD-10-CM | POA: Diagnosis not present

## 2017-10-17 DIAGNOSIS — G5622 Lesion of ulnar nerve, left upper limb: Secondary | ICD-10-CM | POA: Diagnosis not present

## 2017-10-17 DIAGNOSIS — G5602 Carpal tunnel syndrome, left upper limb: Secondary | ICD-10-CM | POA: Diagnosis not present

## 2017-10-18 DIAGNOSIS — R1032 Left lower quadrant pain: Secondary | ICD-10-CM | POA: Diagnosis not present

## 2017-10-25 DIAGNOSIS — M25522 Pain in left elbow: Secondary | ICD-10-CM | POA: Diagnosis not present

## 2017-10-25 DIAGNOSIS — G5603 Carpal tunnel syndrome, bilateral upper limbs: Secondary | ICD-10-CM | POA: Diagnosis not present

## 2017-10-25 DIAGNOSIS — M79642 Pain in left hand: Secondary | ICD-10-CM | POA: Diagnosis not present

## 2017-10-27 DIAGNOSIS — M5116 Intervertebral disc disorders with radiculopathy, lumbar region: Secondary | ICD-10-CM | POA: Diagnosis not present

## 2017-10-31 DIAGNOSIS — M25532 Pain in left wrist: Secondary | ICD-10-CM | POA: Diagnosis not present

## 2018-01-24 DIAGNOSIS — R972 Elevated prostate specific antigen [PSA]: Secondary | ICD-10-CM | POA: Diagnosis not present

## 2018-01-24 DIAGNOSIS — E119 Type 2 diabetes mellitus without complications: Secondary | ICD-10-CM | POA: Diagnosis not present

## 2018-02-05 DIAGNOSIS — E119 Type 2 diabetes mellitus without complications: Secondary | ICD-10-CM | POA: Diagnosis not present

## 2018-02-05 DIAGNOSIS — Z0000 Encounter for general adult medical examination without abnormal findings: Secondary | ICD-10-CM | POA: Diagnosis not present

## 2018-02-05 DIAGNOSIS — H6123 Impacted cerumen, bilateral: Secondary | ICD-10-CM | POA: Diagnosis not present

## 2018-02-06 DIAGNOSIS — M5416 Radiculopathy, lumbar region: Secondary | ICD-10-CM | POA: Diagnosis not present

## 2018-02-06 DIAGNOSIS — M5136 Other intervertebral disc degeneration, lumbar region: Secondary | ICD-10-CM | POA: Diagnosis not present

## 2018-02-23 DIAGNOSIS — K148 Other diseases of tongue: Secondary | ICD-10-CM | POA: Diagnosis not present

## 2018-05-04 DIAGNOSIS — E119 Type 2 diabetes mellitus without complications: Secondary | ICD-10-CM | POA: Diagnosis not present

## 2018-05-07 DIAGNOSIS — L820 Inflamed seborrheic keratosis: Secondary | ICD-10-CM | POA: Diagnosis not present

## 2018-05-07 DIAGNOSIS — Z1283 Encounter for screening for malignant neoplasm of skin: Secondary | ICD-10-CM | POA: Diagnosis not present

## 2018-05-07 DIAGNOSIS — Z85828 Personal history of other malignant neoplasm of skin: Secondary | ICD-10-CM | POA: Diagnosis not present

## 2018-05-07 DIAGNOSIS — L570 Actinic keratosis: Secondary | ICD-10-CM | POA: Diagnosis not present

## 2018-05-07 DIAGNOSIS — D485 Neoplasm of uncertain behavior of skin: Secondary | ICD-10-CM | POA: Diagnosis not present

## 2018-08-03 DIAGNOSIS — E119 Type 2 diabetes mellitus without complications: Secondary | ICD-10-CM | POA: Diagnosis not present

## 2018-08-10 DIAGNOSIS — Z23 Encounter for immunization: Secondary | ICD-10-CM | POA: Diagnosis not present

## 2018-08-10 DIAGNOSIS — G903 Multi-system degeneration of the autonomic nervous system: Secondary | ICD-10-CM | POA: Diagnosis not present

## 2018-08-10 DIAGNOSIS — M25561 Pain in right knee: Secondary | ICD-10-CM | POA: Diagnosis not present

## 2018-08-10 DIAGNOSIS — E119 Type 2 diabetes mellitus without complications: Secondary | ICD-10-CM | POA: Diagnosis not present

## 2018-08-15 DIAGNOSIS — M25561 Pain in right knee: Secondary | ICD-10-CM | POA: Diagnosis not present

## 2018-08-15 DIAGNOSIS — M25861 Other specified joint disorders, right knee: Secondary | ICD-10-CM | POA: Diagnosis not present

## 2018-08-15 DIAGNOSIS — M1711 Unilateral primary osteoarthritis, right knee: Secondary | ICD-10-CM | POA: Diagnosis not present

## 2018-08-27 ENCOUNTER — Encounter: Payer: Self-pay | Admitting: Urology

## 2018-08-27 ENCOUNTER — Ambulatory Visit: Payer: 59 | Admitting: Urology

## 2018-08-27 ENCOUNTER — Telehealth: Payer: Self-pay | Admitting: Urology

## 2018-08-27 VITALS — BP 161/71 | Ht 72.0 in | Wt 227.3 lb

## 2018-08-27 DIAGNOSIS — N529 Male erectile dysfunction, unspecified: Secondary | ICD-10-CM | POA: Diagnosis not present

## 2018-08-27 NOTE — Progress Notes (Signed)
08/27/2018 4:12 PM   Hector Garcia [birth year] [id number]  Referring provider: Rusty Aus, MD [facility] [place], [place] [address]  Chief Complaint  Patient presents with  . Follow-up    HPI: 62 yo WM who presents today for a medication refill.    Background history 62 year old male who presents today for further evaluation and management of erectile dysfunction. The patient states that he is concerned with erectile dysfunction for the last several years. He has tried both Viagra 100 mg and Cialis 20 mg many different times and has noted progressively worsening dysfunction. He has a history of diabetes, hypertension, and tobacco abuse, contributing to his symptoms. The patient states that he has a strong libido, has normal energy level, and no stigmata of low testosterone. Per the patient has done research on alternative options as well versed on a different modalities of erectile dysfunction management.     He states that while he can have intercourse with the erections the firmness of the erection is not at a satisfactory degree.  He is not having painful erections or curvature with erections.  He is not having difficulty with the injections.  He is injecting 9 mcg of Trimix (papaverine 30 mg, phentolamine 1 mg and prostaglandin E1 10 mcg).    He states his been this way since he first started with the injections.  His only urinary complaint at this time is nocturia which is baseline and not bothersome to him.  Patient denies any gross hematuria, dysuria or suprapubic/flank pain.  Patient denies any fevers, chills, nausea or vomiting.   His last PSA was 0.24 on 01/2018.  PMH: Past Medical History:  Diagnosis Date  . Diabetes mellitus without complication ([place])   . Hyperlipidemia   . Hypertension     Surgical History: Past Surgical History:  Procedure Laterality Date  . fisture surgery  1979  . ROTATOR CUFF REPAIR    .  TRIGGER FINGER RELEASE     x several    Home Medications:  Allergies as of 08/27/2018   No Known Allergies     Medication List        Accurate as of 08/27/18  4:12 PM. Always use your most recent med list.          amLODipine 10 MG tablet Commonly known as:  NORVASC Take by mouth.   DOCOSAHEXAENOIC ACID PO Take by mouth.   Flaxseed Oil Oil Use.   glimepiride 2 MG tablet Commonly known as:  AMARYL Take by mouth.   loratadine 10 MG tablet Commonly known as:  CLARITIN Take by mouth.   metFORMIN 500 MG tablet Commonly known as:  GLUCOPHAGE TAKE 2 TABLETS BY MOUTH 2 TIMES DAILY WITH MEALS.   Omega-3 1000 MG Caps Take 1,000 mg by mouth daily.   omeprazole 40 MG capsule Commonly known as:  PRILOSEC Take by mouth.   oxymetazoline 0.05 % nasal spray Commonly known as:  AFRIN by Nasal route.   sildenafil 100 MG tablet Commonly known as:  VIAGRA TAKE 1 TABLET BY MOUTH DAILY   tadalafil 10 MG tablet Commonly known as:  CIALIS Take by mouth.   VITAMIN D-1000 MAX ST 1000 units tablet Generic drug:  Cholecalciferol Take by mouth.       Allergies: No Known Allergies  Family History: Family History  Problem Relation Age of Onset  . Benign prostatic hyperplasia Brother   . Prostate cancer Neg Hx   . Bladder Cancer  Neg Hx   . Kidney cancer Neg Hx     Social History:  reports that he has been smoking. He has been smoking about 0.50 packs per day. He has never used smokeless tobacco. He reports that he drinks about 1.0 standard drinks of alcohol per week. He reports that he does not use drugs.   Physical Exam: BP (!) 161/71 (BP Location: Left Arm, Patient Position: Sitting, Cuff Size: Normal)   Ht 6' (1.829 m)   Wt 227 lb 4.8 oz (103.1 kg)   BMI 30.83 kg/m   Constitutional: Well nourished. Alert and oriented, No acute distress. HEENT: [place] AT, moist mucus membranes. Trachea midline, no masses. Cardiovascular: No clubbing, cyanosis, or  edema. Respiratory: Normal respiratory effort, no increased work of breathing. GI: Abdomen is soft, non tender, non distended, no abdominal masses. Liver and spleen not palpable.  No hernias appreciated.  Stool sample for occult testing is not indicated.   GU: No CVA tenderness.  No bladder fullness or masses.  Patient with circumcised phallus.  Urethral meatus is patent.  No penile discharge. No penile lesions or rashes. Scrotum without lesions, cysts, rashes and/or edema.  Testicles are located scrotally bilaterally. No masses are appreciated in the testicles. Left and right epididymis are normal. Rectal: Patient with  normal sphincter tone. Anus and perineum without scarring or rashes. No rectal masses are appreciated. Prostate is approximately 45 grams, no nodules are appreciated. Seminal vesicles are normal. Skin: No rashes, bruises or suspicious lesions. Lymph: No cervical or inguinal adenopathy. Neurologic: Grossly intact, no focal deficits, moving all 4 extremities. Psychiatric: Normal mood and affect.   Assessment & Plan  1. Erectile dysfunction Patient states that the Trimix is not giving him satisfactory erections even at 9 mcg injection dosing We discussed trying the high dose Trimix, but he will need to start had a lower injection amount.  I advised him to inject only 4 mcg and increase by 1 mcg with the next injection until he reaches satisfactory erections.  If he is not reaching a satisfactory erection when he reaches the 6 mcg injection, he will call the office.  At that time, we may consider switching to Edex to see if he gets better results with this injection. I also explained that if he could do the titration in the morning during the work week so that if he does develop priapism he can contact our office and that we can manage it versus going to the emergency department Advised patient of the condition of priapism, painful erection lasting for more than four hours, and to  contact the office immediately or seek treatment in the ED   Return for patient to call .  Zara Council, PA-C  [facility] [address] [place], [place] [address] [phone]

## 2018-08-27 NOTE — Telephone Encounter (Signed)
Would you please call in Trimix (30 mg PAPA, 1 mL phen, 50 mg prostaglandin) for the patient?  He would like the vial.

## 2018-08-28 NOTE — Telephone Encounter (Signed)
RX faxed

## 2018-10-29 DIAGNOSIS — M5416 Radiculopathy, lumbar region: Secondary | ICD-10-CM | POA: Diagnosis not present

## 2018-10-29 DIAGNOSIS — M5136 Other intervertebral disc degeneration, lumbar region: Secondary | ICD-10-CM | POA: Diagnosis not present

## 2018-12-13 DIAGNOSIS — E291 Testicular hypofunction: Secondary | ICD-10-CM | POA: Diagnosis not present

## 2018-12-13 DIAGNOSIS — E119 Type 2 diabetes mellitus without complications: Secondary | ICD-10-CM | POA: Diagnosis not present

## 2018-12-13 DIAGNOSIS — R635 Abnormal weight gain: Secondary | ICD-10-CM | POA: Diagnosis not present

## 2018-12-13 DIAGNOSIS — E559 Vitamin D deficiency, unspecified: Secondary | ICD-10-CM | POA: Diagnosis not present

## 2018-12-17 DIAGNOSIS — E291 Testicular hypofunction: Secondary | ICD-10-CM | POA: Diagnosis not present

## 2018-12-17 DIAGNOSIS — E119 Type 2 diabetes mellitus without complications: Secondary | ICD-10-CM | POA: Diagnosis not present

## 2018-12-24 DIAGNOSIS — E291 Testicular hypofunction: Secondary | ICD-10-CM | POA: Diagnosis not present

## 2018-12-31 DIAGNOSIS — E291 Testicular hypofunction: Secondary | ICD-10-CM | POA: Diagnosis not present

## 2019-01-07 DIAGNOSIS — R5383 Other fatigue: Secondary | ICD-10-CM | POA: Diagnosis not present

## 2019-01-07 DIAGNOSIS — E611 Iron deficiency: Secondary | ICD-10-CM | POA: Diagnosis not present

## 2019-01-07 DIAGNOSIS — Z79899 Other long term (current) drug therapy: Secondary | ICD-10-CM | POA: Diagnosis not present

## 2019-01-07 DIAGNOSIS — L100 Pemphigus vulgaris: Secondary | ICD-10-CM | POA: Diagnosis not present

## 2019-01-07 DIAGNOSIS — E291 Testicular hypofunction: Secondary | ICD-10-CM | POA: Diagnosis not present

## 2019-01-07 DIAGNOSIS — R6882 Decreased libido: Secondary | ICD-10-CM | POA: Diagnosis not present

## 2019-01-14 DIAGNOSIS — G479 Sleep disorder, unspecified: Secondary | ICD-10-CM | POA: Diagnosis not present

## 2019-01-14 DIAGNOSIS — E291 Testicular hypofunction: Secondary | ICD-10-CM | POA: Diagnosis not present

## 2019-01-14 DIAGNOSIS — R5383 Other fatigue: Secondary | ICD-10-CM | POA: Diagnosis not present

## 2019-01-21 DIAGNOSIS — E291 Testicular hypofunction: Secondary | ICD-10-CM | POA: Diagnosis not present

## 2019-02-01 DIAGNOSIS — E119 Type 2 diabetes mellitus without complications: Secondary | ICD-10-CM | POA: Diagnosis not present

## 2019-02-08 DIAGNOSIS — Z0000 Encounter for general adult medical examination without abnormal findings: Secondary | ICD-10-CM | POA: Diagnosis not present

## 2019-02-08 DIAGNOSIS — E119 Type 2 diabetes mellitus without complications: Secondary | ICD-10-CM | POA: Diagnosis not present

## 2019-02-08 DIAGNOSIS — E782 Mixed hyperlipidemia: Secondary | ICD-10-CM | POA: Diagnosis not present

## 2019-02-25 DIAGNOSIS — L100 Pemphigus vulgaris: Secondary | ICD-10-CM | POA: Diagnosis not present

## 2019-03-11 DIAGNOSIS — S46102A Unspecified injury of muscle, fascia and tendon of long head of biceps, left arm, initial encounter: Secondary | ICD-10-CM | POA: Diagnosis not present

## 2019-03-11 DIAGNOSIS — M7582 Other shoulder lesions, left shoulder: Secondary | ICD-10-CM | POA: Diagnosis not present

## 2019-05-28 ENCOUNTER — Telehealth: Payer: Self-pay | Admitting: Urology

## 2019-05-28 NOTE — Telephone Encounter (Signed)
Please advise on Trimix?

## 2019-05-28 NOTE — Telephone Encounter (Signed)
[facility] said [facility] called office and we denied his tetopel.  He saw Larene Beach 10/19.  Please call pt and let him know what's going on.

## 2019-05-28 NOTE — Telephone Encounter (Signed)
Called patient and clairfied he is asking for a refill on Trimix he is not on testopel. Is it ok to refill or does patient need a follow up first?

## 2019-05-29 NOTE — Telephone Encounter (Signed)
Please refill Mr. Hector Garcia for a 3 month supply and let him know that he will be due for his annual visit with Larene Beach in October.

## 2019-05-31 NOTE — Telephone Encounter (Signed)
Called refill into to [facility].

## 2019-07-18 ENCOUNTER — Other Ambulatory Visit (HOSPITAL_COMMUNITY): Payer: Self-pay | Admitting: Student

## 2019-07-18 DIAGNOSIS — M7582 Other shoulder lesions, left shoulder: Secondary | ICD-10-CM

## 2019-07-18 DIAGNOSIS — S46102D Unspecified injury of muscle, fascia and tendon of long head of biceps, left arm, subsequent encounter: Secondary | ICD-10-CM

## 2019-07-19 ENCOUNTER — Other Ambulatory Visit: Payer: Self-pay | Admitting: Student

## 2019-07-19 DIAGNOSIS — M7582 Other shoulder lesions, left shoulder: Secondary | ICD-10-CM

## 2019-07-19 DIAGNOSIS — S46102D Unspecified injury of muscle, fascia and tendon of long head of biceps, left arm, subsequent encounter: Secondary | ICD-10-CM

## 2019-08-14 ENCOUNTER — Other Ambulatory Visit: Payer: Self-pay

## 2019-08-14 ENCOUNTER — Ambulatory Visit
Admission: RE | Admit: 2019-08-14 | Discharge: 2019-08-14 | Disposition: A | Discharge status: Home / Self Care | Payer: 59 | Source: Ambulatory Visit | Attending: Student | Admitting: Student

## 2019-08-14 DIAGNOSIS — S46102D Unspecified injury of muscle, fascia and tendon of long head of biceps, left arm, subsequent encounter: Secondary | ICD-10-CM

## 2019-08-14 DIAGNOSIS — M7582 Other shoulder lesions, left shoulder: Secondary | ICD-10-CM

## 2019-08-23 ENCOUNTER — Other Ambulatory Visit: Payer: Self-pay

## 2019-08-23 ENCOUNTER — Other Ambulatory Visit
Admission: RE | Admit: 2019-08-23 | Discharge: 2019-08-23 | Disposition: A | Discharge status: Home / Self Care | Payer: 59 | Source: Ambulatory Visit | Attending: Cardiology | Admitting: Cardiology

## 2019-08-23 DIAGNOSIS — Z01812 Encounter for preprocedural laboratory examination: Secondary | ICD-10-CM | POA: Diagnosis not present

## 2019-08-23 DIAGNOSIS — Z20828 Contact with and (suspected) exposure to other viral communicable diseases: Secondary | ICD-10-CM | POA: Diagnosis not present

## 2019-08-23 LAB — SARS CORONAVIRUS 2 (TAT 6-24 HRS): SARS Coronavirus 2: NEGATIVE

## 2019-08-26 ENCOUNTER — Observation Stay
Admission: AD | Admit: 2019-08-26 | Discharge: 2019-08-27 | Disposition: A | Discharge status: Home / Self Care | Payer: 59 | Attending: Cardiology | Admitting: Cardiology

## 2019-08-26 ENCOUNTER — Other Ambulatory Visit: Payer: Self-pay

## 2019-08-26 ENCOUNTER — Encounter
Admission: AD | Disposition: A | Discharge status: Home / Self Care | Payer: Self-pay | Source: Home / Self Care | Attending: Cardiology

## 2019-08-26 DIAGNOSIS — E1165 Type 2 diabetes mellitus with hyperglycemia: Secondary | ICD-10-CM | POA: Diagnosis not present

## 2019-08-26 DIAGNOSIS — Z794 Long term (current) use of insulin: Secondary | ICD-10-CM | POA: Insufficient documentation

## 2019-08-26 DIAGNOSIS — F17210 Nicotine dependence, cigarettes, uncomplicated: Secondary | ICD-10-CM | POA: Diagnosis not present

## 2019-08-26 DIAGNOSIS — Z23 Encounter for immunization: Secondary | ICD-10-CM | POA: Insufficient documentation

## 2019-08-26 DIAGNOSIS — Z791 Long term (current) use of non-steroidal anti-inflammatories (NSAID): Secondary | ICD-10-CM | POA: Diagnosis not present

## 2019-08-26 DIAGNOSIS — L109 Pemphigus, unspecified: Secondary | ICD-10-CM | POA: Diagnosis not present

## 2019-08-26 DIAGNOSIS — R079 Chest pain, unspecified: Secondary | ICD-10-CM | POA: Diagnosis present

## 2019-08-26 DIAGNOSIS — E785 Hyperlipidemia, unspecified: Secondary | ICD-10-CM | POA: Insufficient documentation

## 2019-08-26 DIAGNOSIS — I25110 Atherosclerotic heart disease of native coronary artery with unstable angina pectoris: Principal | ICD-10-CM | POA: Insufficient documentation

## 2019-08-26 DIAGNOSIS — I10 Essential (primary) hypertension: Secondary | ICD-10-CM | POA: Insufficient documentation

## 2019-08-26 DIAGNOSIS — Z79899 Other long term (current) drug therapy: Secondary | ICD-10-CM | POA: Insufficient documentation

## 2019-08-26 DIAGNOSIS — Z7982 Long term (current) use of aspirin: Secondary | ICD-10-CM | POA: Insufficient documentation

## 2019-08-26 DIAGNOSIS — R9430 Abnormal result of cardiovascular function study, unspecified: Secondary | ICD-10-CM | POA: Diagnosis not present

## 2019-08-26 DIAGNOSIS — I200 Unstable angina: Secondary | ICD-10-CM | POA: Diagnosis present

## 2019-08-26 HISTORY — PX: CORONARY STENT INTERVENTION: CATH118234

## 2019-08-26 HISTORY — DX: Pemphigus, unspecified: L10.9

## 2019-08-26 HISTORY — DX: Atherosclerotic heart disease of native coronary artery without angina pectoris: I25.10

## 2019-08-26 HISTORY — PX: LEFT HEART CATH AND CORONARY ANGIOGRAPHY: CATH118249

## 2019-08-26 LAB — GLUCOSE, CAPILLARY
Glucose-Capillary: 175 mg/dL — ABNORMAL HIGH (ref 70–99)
Glucose-Capillary: 222 mg/dL — ABNORMAL HIGH (ref 70–99)

## 2019-08-26 LAB — POCT ACTIVATED CLOTTING TIME: Activated Clotting Time: 510 seconds

## 2019-08-26 SURGERY — LEFT HEART CATH AND CORONARY ANGIOGRAPHY
Anesthesia: Moderate Sedation

## 2019-08-26 MED ORDER — HYDROCHLOROTHIAZIDE 25 MG PO TABS
25.0000 mg | ORAL_TABLET | Freq: Every day | ORAL | Status: DC
  Administered 2019-08-26 – 2019-08-27 (×2): 25 mg via ORAL
  Filled 2019-08-26 (×2): qty 1

## 2019-08-26 MED ORDER — GLIMEPIRIDE 2 MG PO TABS
2.0000 mg | ORAL_TABLET | Freq: Three times a day (TID) | ORAL | Status: DC
  Administered 2019-08-27: 2 mg via ORAL
  Filled 2019-08-26 (×4): qty 1

## 2019-08-26 MED ORDER — SODIUM CHLORIDE 0.9% FLUSH: 3.0000 mL | Freq: Two times a day (BID) | INTRAVENOUS | Status: DC

## 2019-08-26 MED ORDER — INSULIN ASPART 100 UNIT/ML ~~LOC~~ SOLN
0.0000 [IU] | Freq: Three times a day (TID) | SUBCUTANEOUS | Status: DC
  Administered 2019-08-27: 2 [IU] via SUBCUTANEOUS
  Filled 2019-08-26: qty 1

## 2019-08-26 MED ORDER — PRASUGREL HCL 10 MG PO TABS
10.0000 mg | ORAL_TABLET | Freq: Every day | ORAL | Status: DC
  Administered 2019-08-27: 10 mg via ORAL
  Filled 2019-08-26: qty 1

## 2019-08-26 MED ORDER — AMLODIPINE BESYLATE 10 MG PO TABS
10.0000 mg | ORAL_TABLET | Freq: Every day | ORAL | Status: DC
  Administered 2019-08-26 – 2019-08-27 (×2): 10 mg via ORAL
  Filled 2019-08-26 (×2): qty 1

## 2019-08-26 MED ORDER — PREDNISONE 10 MG PO TABS
30.0000 mg | ORAL_TABLET | Freq: Every day | ORAL | Status: DC
  Filled 2019-08-26: qty 3

## 2019-08-26 MED ORDER — ASPIRIN 81 MG PO CHEW
CHEWABLE_TABLET | ORAL | Status: AC
  Filled 2019-08-26: qty 1

## 2019-08-26 MED ORDER — ZOLPIDEM TARTRATE 5 MG PO TABS: 10.0000 mg | ORAL_TABLET | Freq: Every evening | ORAL | Status: DC | PRN

## 2019-08-26 MED ORDER — SODIUM CHLORIDE 0.9 % IV SOLN
INTRAVENOUS | Status: AC | PRN
  Administered 2019-08-26: 0.25 mg/kg/h via INTRAVENOUS
  Administered 2019-08-26: 1.75 mg/kg/h via INTRAVENOUS

## 2019-08-26 MED ORDER — SODIUM CHLORIDE 0.9% FLUSH: 3.0000 mL | INTRAVENOUS | Status: DC | PRN

## 2019-08-26 MED ORDER — VERAPAMIL HCL 2.5 MG/ML IV SOLN
INTRAVENOUS | Status: DC | PRN
  Administered 2019-08-26: 2.5 mg via INTRA_ARTERIAL

## 2019-08-26 MED ORDER — NITROGLYCERIN 1 MG/10 ML FOR IR/CATH LAB
INTRA_ARTERIAL | Status: AC
  Filled 2019-08-26: qty 10

## 2019-08-26 MED ORDER — PNEUMOCOCCAL VAC POLYVALENT 25 MCG/0.5ML IJ INJ
0.5000 mL | INJECTION | INTRAMUSCULAR | Status: AC
  Administered 2019-08-27: 0.5 mL via INTRAMUSCULAR
  Filled 2019-08-26: qty 0.5

## 2019-08-26 MED ORDER — SODIUM CHLORIDE 0.9 % WEIGHT BASED INFUSION: 1.0000 mL/kg/h | INTRAVENOUS | Status: DC

## 2019-08-26 MED ORDER — SODIUM CHLORIDE 0.9% FLUSH
3.0000 mL | Freq: Two times a day (BID) | INTRAVENOUS | Status: DC
  Administered 2019-08-26 (×2): 3 mL via INTRAVENOUS

## 2019-08-26 MED ORDER — LISINOPRIL 10 MG PO TABS
20.0000 mg | ORAL_TABLET | Freq: Every day | ORAL | Status: DC
  Administered 2019-08-26 – 2019-08-27 (×2): 20 mg via ORAL
  Filled 2019-08-26 (×2): qty 2

## 2019-08-26 MED ORDER — ATORVASTATIN CALCIUM 20 MG PO TABS
20.0000 mg | ORAL_TABLET | Freq: Every day | ORAL | Status: DC
  Administered 2019-08-26: 20 mg via ORAL
  Filled 2019-08-26: qty 1

## 2019-08-26 MED ORDER — SODIUM CHLORIDE 0.9 % WEIGHT BASED INFUSION
1.0000 mL/kg/h | INTRAVENOUS | Status: AC
  Administered 2019-08-26 (×2): 1 mL/kg/h via INTRAVENOUS

## 2019-08-26 MED ORDER — SODIUM CHLORIDE 0.9 % WEIGHT BASED INFUSION
3.0000 mL/kg/h | INTRAVENOUS | Status: AC
  Administered 2019-08-26: 3 mL/kg/h via INTRAVENOUS

## 2019-08-26 MED ORDER — HEPARIN (PORCINE) IN NACL 1000-0.9 UT/500ML-% IV SOLN
INTRAVENOUS | Status: DC | PRN
  Administered 2019-08-26: 1000 mL

## 2019-08-26 MED ORDER — ZOLPIDEM TARTRATE 10 MG PO TABS: 10.0000 mg | ORAL_TABLET | Freq: Every evening | ORAL | Status: DC | PRN

## 2019-08-26 MED ORDER — ASPIRIN 81 MG PO CHEW
CHEWABLE_TABLET | ORAL | Status: AC
  Filled 2019-08-26: qty 3

## 2019-08-26 MED ORDER — ONDANSETRON HCL 4 MG/2ML IJ SOLN: 4.0000 mg | Freq: Four times a day (QID) | INTRAMUSCULAR | Status: DC | PRN

## 2019-08-26 MED ORDER — PRASUGREL HCL 10 MG PO TABS
ORAL_TABLET | ORAL | Status: DC | PRN
  Administered 2019-08-26: 60 mg via ORAL

## 2019-08-26 MED ORDER — HEPARIN SODIUM (PORCINE) 1000 UNIT/ML IJ SOLN
INTRAMUSCULAR | Status: AC
  Filled 2019-08-26: qty 1

## 2019-08-26 MED ORDER — FENTANYL CITRATE (PF) 100 MCG/2ML IJ SOLN
INTRAMUSCULAR | Status: AC
  Filled 2019-08-26: qty 2

## 2019-08-26 MED ORDER — SODIUM CHLORIDE 0.9 % IV SOLN: 250.0000 mL | INTRAVENOUS | Status: DC | PRN

## 2019-08-26 MED ORDER — INSULIN ASPART 100 UNIT/ML ~~LOC~~ SOLN
0.0000 [IU] | Freq: Every day | SUBCUTANEOUS | Status: DC
  Administered 2019-08-26: 2 [IU] via SUBCUTANEOUS
  Filled 2019-08-26: qty 1

## 2019-08-26 MED ORDER — AZATHIOPRINE 50 MG PO TABS
50.0000 mg | ORAL_TABLET | Freq: Every day | ORAL | Status: DC
  Administered 2019-08-26 – 2019-08-27 (×2): 50 mg via ORAL
  Filled 2019-08-26 (×2): qty 1

## 2019-08-26 MED ORDER — METOPROLOL SUCCINATE ER 25 MG PO TB24
25.0000 mg | ORAL_TABLET | Freq: Every day | ORAL | Status: DC
  Administered 2019-08-27: 25 mg via ORAL
  Filled 2019-08-26 (×2): qty 1

## 2019-08-26 MED ORDER — ASPIRIN 81 MG PO CHEW
81.0000 mg | CHEWABLE_TABLET | Freq: Every day | ORAL | Status: DC
  Administered 2019-08-26 – 2019-08-27 (×2): 81 mg via ORAL
  Filled 2019-08-26 (×2): qty 1

## 2019-08-26 MED ORDER — IOHEXOL 300 MG/ML  SOLN
INTRAMUSCULAR | Status: DC | PRN
  Administered 2019-08-26: 335 mL

## 2019-08-26 MED ORDER — ACETAMINOPHEN 325 MG PO TABS: 650.0000 mg | ORAL_TABLET | ORAL | Status: DC | PRN

## 2019-08-26 MED ORDER — ASPIRIN 81 MG PO CHEW
81.0000 mg | CHEWABLE_TABLET | ORAL | Status: AC
  Administered 2019-08-26: 81 mg via ORAL

## 2019-08-26 MED ORDER — HYDRALAZINE HCL 20 MG/ML IJ SOLN: 10.0000 mg | INTRAMUSCULAR | Status: AC | PRN

## 2019-08-26 MED ORDER — VERAPAMIL HCL 2.5 MG/ML IV SOLN
INTRAVENOUS | Status: AC
  Filled 2019-08-26: qty 2

## 2019-08-26 MED ORDER — ASPIRIN 81 MG PO CHEW
CHEWABLE_TABLET | ORAL | Status: DC | PRN
  Administered 2019-08-26: 243 mg via ORAL

## 2019-08-26 MED ORDER — HEPARIN (PORCINE) IN NACL 1000-0.9 UT/500ML-% IV SOLN
INTRAVENOUS | Status: AC
  Filled 2019-08-26: qty 1000

## 2019-08-26 MED ORDER — MIDAZOLAM HCL 2 MG/2ML IJ SOLN
INTRAMUSCULAR | Status: DC | PRN
  Administered 2019-08-26 (×2): 1 mg via INTRAVENOUS

## 2019-08-26 MED ORDER — BIVALIRUDIN BOLUS VIA INFUSION - CUPID
INTRAVENOUS | Status: DC | PRN
  Administered 2019-08-26: 78.225 mg via INTRAVENOUS

## 2019-08-26 MED ORDER — BIVALIRUDIN TRIFLUOROACETATE 250 MG IV SOLR
INTRAVENOUS | Status: AC
  Filled 2019-08-26: qty 250

## 2019-08-26 MED ORDER — DOXAZOSIN MESYLATE 4 MG PO TABS
4.0000 mg | ORAL_TABLET | Freq: Every day | ORAL | Status: DC
  Administered 2019-08-26 – 2019-08-27 (×2): 4 mg via ORAL
  Filled 2019-08-26 (×2): qty 1

## 2019-08-26 MED ORDER — LABETALOL HCL 5 MG/ML IV SOLN: 10.0000 mg | INTRAVENOUS | Status: AC | PRN

## 2019-08-26 MED ORDER — FENTANYL CITRATE (PF) 100 MCG/2ML IJ SOLN
INTRAMUSCULAR | Status: DC | PRN
  Administered 2019-08-26 (×2): 50 ug via INTRAVENOUS

## 2019-08-26 MED ORDER — PRASUGREL HCL 10 MG PO TABS
ORAL_TABLET | ORAL | Status: AC
  Filled 2019-08-26: qty 6

## 2019-08-26 MED ORDER — HEPARIN SODIUM (PORCINE) 1000 UNIT/ML IJ SOLN
INTRAMUSCULAR | Status: DC | PRN
  Administered 2019-08-26: 5000 [IU] via INTRAVENOUS

## 2019-08-26 MED ORDER — MIDAZOLAM HCL 2 MG/2ML IJ SOLN
INTRAMUSCULAR | Status: AC
  Filled 2019-08-26: qty 2

## 2019-08-26 SURGICAL SUPPLY — 22 items
BALLN TREK RX 2.5X12 (BALLOONS) ×3
BALLOON TREK RX 2.5X12 (BALLOONS) ×2 IMPLANT
CATH 5F 110X4 TIG (CATHETERS) ×3 IMPLANT
CATH INFINITI 5 FR JL3.5 (CATHETERS) ×3 IMPLANT
CATH INFINITI 5FR ANG PIGTAIL (CATHETERS) ×3 IMPLANT
CATH INFINITI 5FR JL4 (CATHETERS) ×3 IMPLANT
CATH INFINITI JR4 5F (CATHETERS) ×3 IMPLANT
CATH VISTA GUIDE 6FR XB3.5 SH (CATHETERS) ×3 IMPLANT
DEVICE CLOSURE MYNXGRIP 6/7F (Vascular Products) ×3 IMPLANT
DEVICE INFLAT 30 PLUS (MISCELLANEOUS) ×3 IMPLANT
DEVICE RAD TR BAND REGULAR (VASCULAR PRODUCTS) ×3 IMPLANT
DEVICE SAFEGUARD 24CM (GAUZE/BANDAGES/DRESSINGS) ×3 IMPLANT
GLIDESHEATH SLEND SS 6F .021 (SHEATH) ×3 IMPLANT
KIT MANI 3VAL PERCEP (MISCELLANEOUS) ×3 IMPLANT
NEEDLE PERC 18GX7CM (NEEDLE) ×3 IMPLANT
PACK CARDIAC CATH (CUSTOM PROCEDURE TRAY) ×3 IMPLANT
SHEATH AVANTI 6FR X 11CM (SHEATH) ×3 IMPLANT
STENT RESOLUTE ONYX 3.0X12 (Permanent Stent) ×3 IMPLANT
WIRE ASAHI PROWATER 180CM (WIRE) ×3 IMPLANT
WIRE GUIDERIGHT .035X150 (WIRE) ×3 IMPLANT
WIRE HITORQ VERSACORE ST 145CM (WIRE) ×3 IMPLANT
WIRE ROSEN-J .035X260CM (WIRE) ×3 IMPLANT

## 2019-08-26 NOTE — Plan of Care (Signed)

## 2019-08-26 NOTE — Consult Note (Signed)
[place] [facility] [place] NAME: Hector Garcia    MR#:  [id number]  DATE OF BIRTH:  [birth year]  DATE OF ADMISSION:  08/26/2019  PRIMARY CARE PHYSICIAN: Rusty Aus, MD   CONSULT REQUESTING/REFERRING PHYSICIAN: Dr. Stephani Police  REASON FOR CONSULT: Diabetes mellitus, HTN  CHIEF COMPLAINT:  No chief complaint on file.  Chest pain  HISTORY OF PRESENT ILLNESS:  Hector Garcia  is a 63 y.o. male with a known history of HTN, DM, hyperlipidemia, pemphigus on prednisone admitted to the hospital after having elective cardiac catheterization for unstable angina.  Patient had chest pain and had stress test which was abnormal.  Today he had a drug-eluting stent placed by cardiology.  Patient found to have hyperglycemia.  Takes Jardiance, glipizide and metformin at home.  Blood sugars 175.  Not on insulin.  He is slowly tapering prednisone since February 2020.  Jardiance started 3 weeks back.  Blood sugars continue to run 1 60-1 70 at home. Not compliant with diabetic diet.  PAST MEDICAL HISTORY:   Past Medical History:  Diagnosis Date  . CAD (coronary artery disease)   . Diabetes mellitus without complication ([place])   . Hyperlipidemia   . Hypertension   . Pemphigus     PAST SURGICAL HISTOIRY:   Past Surgical History:  Procedure Laterality Date  . fisture surgery  1979  . ROTATOR CUFF REPAIR    . TRIGGER FINGER RELEASE     x several    SOCIAL HISTORY:   Social History   Tobacco Use  . Smoking status: Current Some Day Smoker    Packs/day: 0.50    Last attempt to quit: 11/2016    Years since quitting: 2.7  . Smokeless tobacco: Never Used  . Tobacco comment: may smoke 1 pack a week  Substance Use Topics  . Alcohol use: Yes    Alcohol/week: 1.0 standard drinks    Types: 1 Cans of beer per week    Comment: 1 beer weekly    FAMILY HISTORY:   Family History  Problem Relation Age of Onset  . Benign prostatic hyperplasia Brother   .  Prostate cancer Neg Hx   . Bladder Cancer Neg Hx   . Kidney cancer Neg Hx     DRUG ALLERGIES:  No Known Allergies  REVIEW OF SYSTEMS:   ROS  CONSTITUTIONAL: No fever, fatigue or weakness.  EYES: No blurred or double vision.  EARS, NOSE, AND THROAT: No tinnitus or ear pain.  RESPIRATORY: No cough, shortness of breath, wheezing or hemoptysis.  CARDIOVASCULAR: No chest pain, orthopnea, edema.  GASTROINTESTINAL: No nausea, vomiting, diarrhea or abdominal pain.  GENITOURINARY: No dysuria, hematuria.  ENDOCRINE: No polyuria, nocturia,  HEMATOLOGY: No anemia, easy bruising or bleeding SKIN: No rash or lesion. MUSCULOSKELETAL: No joint pain or arthritis.   NEUROLOGIC: No tingling, numbness, weakness.  PSYCHIATRY: No anxiety or depression.   MEDICATIONS AT HOME:   Prior to Admission medications   Medication Sig Start Date End Date Taking? Authorizing Provider  amLODipine (NORVASC) 10 MG tablet Take 10 mg by mouth daily.  07/20/16  Yes [provider]  Ascorbic Acid (VITAMIN C) 1000 MG tablet Take 1,000 mg by mouth daily.   Yes [provider]  aspirin EC 81 MG tablet Take 81 mg by mouth at bedtime.   Yes [provider]  atorvastatin (LIPITOR) 20 MG tablet Take 20 mg by mouth at bedtime.   Yes [provider]  docusate sodium (  COLACE) 100 MG capsule Take 100 mg by mouth every evening.   Yes [provider]  doxazosin (CARDURA) 4 MG tablet Take 4 mg by mouth at bedtime.   Yes [provider]  empagliflozin (JARDIANCE) 25 MG TABS tablet Take 25 mg by mouth daily.   Yes [provider]  Flaxseed, Linseed, (BIO-FLAX) 1000 MG CAPS Take 1,000 mg by mouth daily.   Yes [provider]  glimepiride (AMARYL) 2 MG tablet Take 6 mg by mouth daily.  07/20/16  Yes [provider]  lisinopril-hydrochlorothiazide (ZESTORETIC) 20-25 MG tablet Take 1 tablet by mouth daily.   Yes [provider]  meloxicam (MOBIC) 15 MG  tablet Take 15 mg by mouth daily.   Yes [provider]  metFORMIN (GLUCOPHAGE) 500 MG tablet Take 500-1,000 mg by mouth See admin instructions. Take 1000 mg in the morning and 500 mg in the evening 02/06/17  Yes [provider]  metoprolol succinate (TOPROL-XL) 25 MG 24 hr tablet Take 25 mg by mouth daily.   Yes [provider]  naproxen sodium (ALEVE) 220 MG tablet Take 440 mg by mouth daily as needed (pain).   Yes [provider]  nitroGLYCERIN (NITROSTAT) 0.4 MG SL tablet Place 0.4 mg under the tongue every 5 (five) minutes as needed for chest pain.   Yes [provider]  Omega-3 Fatty Acids (FISH OIL) 1200 MG CAPS Take 1,200 mg by mouth daily.   Yes [provider]  omeprazole (PRILOSEC) 40 MG capsule Take 50 mg by mouth daily.  07/20/16  Yes [provider]  OVER THE COUNTER MEDICATION Apply 1 application topically daily. proclearz fungal shield   Yes [provider]  Phenylephrine-Pheniramine (DRISTAN NA) Place 1 spray into the nose daily as needed (allergies).   Yes [provider]  predniSONE (DELTASONE) 10 MG tablet Take 10-12.5 mg by mouth See admin instructions. Take 12.5 mg every other day for 2 weeks, then 10 mg every other day for 2 weeks, 7.5 mg every other day for 2 weeks, 5 mg every other day for 2 weeks, 2.5 mg every other day for 2 weeks then stop   Yes [provider]  zolpidem (AMBIEN) 10 MG tablet Take 5 mg by mouth at bedtime as needed for sleep.   Yes [provider]      VITAL SIGNS:  Blood pressure (!) 147/66, pulse (!) 51, temperature 97.6 F (36.4 C), temperature source Oral, resp. rate 20, height 6\' 1"  (1.854 m), weight 100 kg, SpO2 98 %.  PHYSICAL EXAMINATION:  GENERAL:  63 y.o.-year-old patient lying in the bed with no acute distress.  EYES: Pupils equal, round, reactive to light and accommodation. No scleral icterus. Extraocular muscles intact.  HEENT: Head atraumatic,  normocephalic. Oropharynx and nasopharynx clear.  NECK:  Supple, no jugular venous distention. No thyroid enlargement, no tenderness.  LUNGS: Normal breath sounds bilaterally, no wheezing, rales,rhonchi or crepitation. No use of accessory muscles of respiration.  CARDIOVASCULAR: S1, S2 normal. No murmurs, rubs, or gallops.  ABDOMEN: Soft, nontender, nondistended. Bowel sounds present. No organomegaly or mass.  EXTREMITIES: No pedal edema, cyanosis, or clubbing.  NEUROLOGIC: Cranial nerves II through XII are intact. Muscle strength 5/5 in all extremities. Sensation intact. Gait not checked.  PSYCHIATRIC: The patient is alert and oriented x 3.  SKIN: No obvious rash, lesion, or ulcer.   LABORATORY PANEL:   CBC No results for input(s): WBC, HGB, HCT, PLT in the last 168 hours. ------------------------------------------------------------------------------------------------------------------  Chemistries  No results for input(s): NA, K, CL, CO2, GLUCOSE, BUN, CREATININE, CALCIUM, MG, AST, ALT, ALKPHOS, BILITOT in the last 168 hours.  Invalid input(s): GFRCGP ------------------------------------------------------------------------------------------------------------------  Cardiac Enzymes No results for input(s): TROPONINI in the last 168 hours. ------------------------------------------------------------------------------------------------------------------  RADIOLOGY:  No results found.  EKG:   Orders placed or performed during the hospital encounter of 08/26/19  . EKG 12-Lead immediately post procedure  . EKG 12-Lead  . EKG 12-Lead immediately post procedure    IMPRESSION AND PLAN:   *Diabetes mellitus uncontrolled with  Hyperglycemia We will start sliding scale insulin, Accu-Cheks ACHS. Continue glipizide as ordered.  Hold metformin and Jardiance.  *Unstable angina with PCI with drug-eluting stent.  Management as per cardiology.  *Hypertension.  On amlodipine lisinopril and  hydrochlorothiazide  *Pemphigus.  On Imuran and prednisone.  Follow-up as outpatient  All the records are reviewed and case discussed with Consulting provider. Management plans discussed with the patient, family and they are in agreement.  CODE STATUS: FULL CODE  TOTAL TIME TAKING CARE OF THIS PATIENT: 45 minutes.    Leia Alf Tiamarie Furnari M.D on 08/26/2019 at 9:22 PM  Between 7am to 6pm - Pager - [phone]  After 6pm go to [email or web address] - password [email or web address] [facility]  Office  [phone]  CC: Primary care Physician: Rusty Aus, MD     Note: This dictation was prepared with Dragon dictation along with smaller phrase technology. Any transcriptional errors that result from this process are unintentional.

## 2019-08-27 ENCOUNTER — Encounter: Payer: Self-pay | Admitting: Cardiology

## 2019-08-27 DIAGNOSIS — I25110 Atherosclerotic heart disease of native coronary artery with unstable angina pectoris: Secondary | ICD-10-CM | POA: Diagnosis not present

## 2019-08-27 LAB — CBC
HCT: 39.8 % (ref 39.0–52.0)
Hemoglobin: 13 g/dL (ref 13.0–17.0)
MCH: 29 pg (ref 26.0–34.0)
MCHC: 32.7 g/dL (ref 30.0–36.0)
MCV: 88.6 fL (ref 80.0–100.0)
Platelets: 247 10*3/uL (ref 150–400)
RBC: 4.49 MIL/uL (ref 4.22–5.81)
RDW: 12.3 % (ref 11.5–15.5)
WBC: 13.5 10*3/uL — ABNORMAL HIGH (ref 4.0–10.5)
nRBC: 0 % (ref 0.0–0.2)

## 2019-08-27 LAB — BASIC METABOLIC PANEL
Anion gap: 9 (ref 5–15)
BUN: 16 mg/dL (ref 8–23)
CO2: 23 mmol/L (ref 22–32)
Calcium: 8.9 mg/dL (ref 8.9–10.3)
Chloride: 105 mmol/L (ref 98–111)
Creatinine, Ser: 0.74 mg/dL (ref 0.61–1.24)
GFR calc Af Amer: >60 mL/min (ref >60)
GFR calc non Af Amer: >60 mL/min (ref >60)
Glucose, Bld: 179 mg/dL — ABNORMAL HIGH (ref 70–99)
Potassium: 4.1 mmol/L (ref 3.5–5.1)
Sodium: 137 mmol/L (ref 135–145)

## 2019-08-27 LAB — GLUCOSE, CAPILLARY: Glucose-Capillary: 178 mg/dL — ABNORMAL HIGH (ref 70–99)

## 2019-08-27 LAB — HEMOGLOBIN A1C
Hgb A1c MFr Bld: 8.2 % — ABNORMAL HIGH (ref 4.8–5.6)
Mean Plasma Glucose: 188.64 mg/dL

## 2019-08-27 MED ORDER — METOPROLOL SUCCINATE ER 25 MG PO TB24: 12.5000 mg | ORAL_TABLET | Freq: Every day | ORAL | Status: DC

## 2019-08-27 MED ORDER — METOPROLOL SUCCINATE ER 25 MG PO TB24: 12.5000 mg | ORAL_TABLET | Freq: Every day | ORAL | 11 refills | Status: AC

## 2019-08-27 MED ORDER — PRASUGREL HCL 10 MG PO TABS: 10.0000 mg | ORAL_TABLET | Freq: Every day | ORAL | 11 refills | Status: DC

## 2019-08-27 NOTE — Care Management (Signed)
Called [facility] in [place];  Effient will cost $12.00 for a 3 month supply.  Notified patient and Clabe Seal.

## 2019-08-27 NOTE — Plan of Care (Signed)
  Problem: Education: Goal: Individualized Educational Video(s) Outcome: Progressing   Problem: Activity: Goal: Ability to return to baseline activity level will improve Outcome: Progressing   Problem: Cardiovascular: Goal: Vascular access site(s) Level 0-1 will be maintained Outcome: Progressing   

## 2019-08-27 NOTE — Discharge Summary (Signed)
Physician Discharge Summary  Patient ID: Hector Garcia MRN: [id number] DOB/AGE: [birth year] 63 y.o.  Admit date: 08/26/2019 Discharge date: 08/27/2019  Primary Discharge Diagnosis Unstable angina Secondary Discharge Diagnosis same  Significant Diagnostic Studies: Cardiac catheterization and selective coronary arteriography  Consults: internal medicine  Hospital Course: 63 year old male with multiple cardiovascular risk factors, with a recent history of new onset exertional chest pain, with abnormal stress echocardiogram, who elected for a cardiac catheterization with selective coronary arteriography.  The patient underwent cardiac catheterization on 08/26/2019, which revealed normal left ventricular function with two-vessel coronary artery disease with occluded mid RCA with contra collaterals from left circumflex, and high-grade 99% stenosis ostial/proximal ramus intermedius branch.  The patient underwent successful PCI with DES ostium/proximal ramus intermedius branch.  The patient denies chest pain or shortness of breath this morning.  ECG this morning reveals sinus bradycardia at a rate of 46 bpm with no evidence of acute ischemia.    Discharge Exam: Blood pressure 136/74, pulse (!) 46, temperature 97.9 F (36.6 C), temperature source Oral, resp. rate 20, height 6\' 1"  (1.854 m), weight 99.6 kg, SpO2 97 %.   General appearance: alert, cooperative, appears stated age and no distress Head: Normocephalic, without obvious abnormality Back: negative Resp: normal effort of breathing on room air Cardio: regular rhythm, bradycardic Extremities: extremities normal, atraumatic, no cyanosis or edema Skin: Skin color, texture, turgor normal. No rashes or lesions or no diaphoresis Neurologic: Grossly normal Incision/Wound: right wrist with dressing in place without drainage, bleeding, erythema or edema. Pressure dressing on right groin removed revealing half dollar size ecchymoses without  hematoma with no bleeding, drainage, or erythema Labs:   Lab Results  Component Value Date   WBC 13.5 (H) 08/27/2019   HGB 13.0 08/27/2019   HCT 39.8 08/27/2019   MCV 88.6 08/27/2019   PLT 247 08/27/2019    Recent Labs  Lab 08/27/19 0412  NA 137  K 4.1  CL 105  CO2 23  BUN 16  CREATININE 0.74  CALCIUM 8.9  GLUCOSE 179*    EKG: sinus bradycardia  FOLLOW UP PLANS AND APPOINTMENTS Discharge Instructions    AMB Referral to Cardiac Rehabilitation - Phase II   Complete by: As directed    Diagnosis: Coronary Stents   After initial evaluation and assessments completed: Virtual Based Care may be provided alone or in conjunction with Phase 2 Cardiac Rehab based on patient barriers.: Yes   Amb Referral to Cardiac Rehabilitation   Complete by: As directed    Diagnosis: Coronary Stents   After initial evaluation and assessments completed: Virtual Based Care may be provided alone or in conjunction with Phase 2 Cardiac Rehab based on patient barriers.: Yes     Allergies as of 08/27/2019   No Known Allergies     Medication List    STOP taking these medications   meloxicam 15 MG tablet Commonly known as: MOBIC   naproxen sodium 220 MG tablet Commonly known as: ALEVE     TAKE these medications   amLODipine 10 MG tablet Commonly known as: NORVASC Take 10 mg by mouth daily.   aspirin EC 81 MG tablet Take 81 mg by mouth at bedtime.   atorvastatin 20 MG tablet Commonly known as: LIPITOR Take 20 mg by mouth at bedtime.   Bio-Flax 1000 MG Caps Take 1,000 mg by mouth daily.   docusate sodium 100 MG capsule Commonly known as: COLACE Take 100 mg by mouth every evening.   doxazosin 4 MG tablet Commonly  known as: CARDURA Take 4 mg by mouth at bedtime.   DRISTAN NA Place 1 spray into the nose daily as needed (allergies).   Fish Oil 1200 MG Caps Take 1,200 mg by mouth daily.   glimepiride 2 MG tablet Commonly known as: AMARYL Take 6 mg by mouth daily.   Jardiance  25 MG Tabs tablet Generic drug: empagliflozin Take 25 mg by mouth daily.   lisinopril-hydrochlorothiazide 20-25 MG tablet Commonly known as: ZESTORETIC Take 1 tablet by mouth daily.   metFORMIN 500 MG tablet Commonly known as: GLUCOPHAGE Take 500-1,000 mg by mouth See admin instructions. Take 1000 mg in the morning and 500 mg in the evening   metoprolol succinate 25 MG 24 hr tablet Commonly known as: TOPROL-XL Take 0.5 tablets (12.5 mg total) by mouth daily. Start taking on: August 28, 2019 What changed: how much to take   nitroGLYCERIN 0.4 MG SL tablet Commonly known as: NITROSTAT Place 0.4 mg under the tongue every 5 (five) minutes as needed for chest pain.   omeprazole 40 MG capsule Commonly known as: PRILOSEC Take 50 mg by mouth daily.   OVER THE COUNTER MEDICATION Apply 1 application topically daily. proclearz fungal shield   prasugrel 10 MG Tabs tablet Commonly known as: EFFIENT Take 1 tablet (10 mg total) by mouth daily. Start taking on: August 28, 2019   predniSONE 10 MG tablet Commonly known as: DELTASONE Take 10-12.5 mg by mouth See admin instructions. Take 12.5 mg every other day for 2 weeks, then 10 mg every other day for 2 weeks, 7.5 mg every other day for 2 weeks, 5 mg every other day for 2 weeks, 2.5 mg every other day for 2 weeks then stop   vitamin C 1000 MG tablet Take 1,000 mg by mouth daily.   zolpidem 10 MG tablet Commonly known as: AMBIEN Take 5 mg by mouth at bedtime as needed for sleep.      Follow-up Information    [facility] Follow up.   Specialty: Cardiac Rehabilitation Why: Your Cardiologist has referred you to outpatient Cardiac Rehab at [facility].  The Cardiac Rehab dept will contact you by phone within one to two weeks after discharge to schedule your first appointment.   Contact information: [place] [id number] [address] [place] [employer]       Isaias Cowman, MD. Go in  1 week(s).   Specialty: Cardiology Contact information: [facility] [place] [address] [phone]           BRING ALL MEDICATIONS WITH YOU TO FOLLOW UP APPOINTMENTS  Time spent with patient to include physician time: 25 minutes Signed:  Clabe Seal PA-C 08/27/2019, 9:51 AM

## 2019-08-27 NOTE — Progress Notes (Signed)
Cardiovascular and Pulmonary Nurse Navigator Note:   63 year old male with PMH of DM Type 2, HLD, HTN, Pemphigus Vulgaris, Restless Leg Syndrome, and Tobacco Abuse - Multiple cardiovascular risk factors -  with a recent history of new onset exertional chest pain, with abnormal stress echocardiogram, who elected for a cardiac catheterization with selective coronary arteriography.  The patient underwent cardiac catheterization on 08/26/2019, which revealed normal left ventricular function with two-vessel coronary artery disease with occluded mid RCA with contra collaterals from left circumflex, and high-grade 99% stenosis ostial/proximal ramus intermedius branch.  The patient underwent successful PCI with DES ostium/proximal ramus intermedius branch.    Education: CAD, location of CAD, and location of stent discussed with patient.  Informed patient he will be given a stent card.  Discussed purpose of stent card and instructed patient to keep in his wallet.    Discussed modifiable risk factors including controlling blood pressure, cholesterol, and blood sugar; following heart healthy diet; maintaining healthy weight; exercise; and smoking cessation.     Discussed cardiac medications including rationale for taking, mechanisms of action, and side effects. Stressed the importance of taking medications as prescribed. RN CM with TOC team checking on cost of Effient for patient.    Discussed emergency plan for heart attack symptoms. Patient verbalized understanding of need to call 911 and not to drive himself to ER if having cardiac symptoms / chest pain.  Diet of low sodium, low fat, low cholesterol heart healthy / carb modified diet discussed.   Smoking Cessation - Patient is a CURRENT  every day smoker. Patient has been advised to quit by his cardiologist.  Exercise - Benefits of exercised discussed.  Patient stated he was walking / exercising and that is when he started noticing the chest pain.  Informed  patient his cardiologist has referred him to outpatient Cardiac Rehab. An overview of the program was provided. Brochure, informational letter with CPT billing codes given to patient. Patient is interested in participating for part of the program.  He does not feel with his schedule he will be able to participate for the full program.  AHA guidelines on exercise for all adults of 150 minutes of moderate exercise per week discussed with patient.   Patient plans to check with his insurance company to see what his out-of-pocket expenses will be.   Patient appreciative of the above information.    Roanna Epley, RN, BSN, [facility]  Cardiovascular & Pulmonary Nurse Navigator  Direct Line: [phone]  Department Phone #: [phone] Fax: [phone]  Email Address: [email or web address]

## 2019-08-27 NOTE — TOC Benefit Eligibility Note (Signed)
Transition of Care [facility]) Benefit Eligibility Note    Patient Details  Name: Hector Garcia MRN: [id number] Date of Birth: 29-Oct-1956   Medication/Dose: Prasugrel 10mg  daily  Covered?: Yes  Prescription [facility]age [facility]  Spoke with Person/Company/Phone Number:: Olivia Mackie with [facility] [employer] at [phone]  Co-Pay: $12.50 estimated copay for 90 day supply  Prior Approval: No  Deductible: (No deductible.  Out of pocket max of $3,000 - rep unable to release accumulations.)  Additional Notes: Per rep, pharmacy already received and filled generic (Prasugrel) for Effient today.    Dannette Barbara Phone Number: [phone] or [phone] 08/27/2019, 10:18 AM

## 2019-08-27 NOTE — Progress Notes (Signed)
Patient discharged home as ordered,instructions explained and well understood,follow up appointment made,vital signs within normal limits upon discharge,escorted by staff member via wheel chair

## 2019-09-05 DIAGNOSIS — Z955 Presence of coronary angioplasty implant and graft: Secondary | ICD-10-CM | POA: Insufficient documentation

## 2019-09-18 ENCOUNTER — Encounter: Payer: 59 | Attending: Cardiology | Admitting: *Deleted

## 2019-09-18 ENCOUNTER — Other Ambulatory Visit: Payer: Self-pay

## 2019-09-18 DIAGNOSIS — Z955 Presence of coronary angioplasty implant and graft: Secondary | ICD-10-CM | POA: Insufficient documentation

## 2019-09-18 NOTE — Progress Notes (Signed)
Completed virtual orientation today.  Documentation for diagnosis can be found in [facility] encounter 10/12.  EP eval scheduled for 11/9.

## 2019-09-23 ENCOUNTER — Other Ambulatory Visit: Payer: Self-pay

## 2019-09-23 ENCOUNTER — Encounter: Payer: 59 | Admitting: *Deleted

## 2019-09-23 VITALS — Ht 72.1 in | Wt 218.4 lb

## 2019-09-23 DIAGNOSIS — Z955 Presence of coronary angioplasty implant and graft: Secondary | ICD-10-CM

## 2019-09-23 NOTE — Progress Notes (Signed)
Cardiac Individual Treatment Plan  Patient Details  Name: Hector Garcia MRN: [id number] Date of Birth: 12/01/1955 Referring Provider:     Cardiac Rehab from 09/23/2019 in [facility] Cardiac and Pulmonary Rehab  Referring Provider  Isaias Cowman MD      Initial Encounter Date:    Cardiac Rehab from 09/23/2019 in [facility] Cardiac and Pulmonary Rehab  Date  09/23/19      Visit Diagnosis: Status post coronary artery stent placement  Patient's Home Medications on Admission:  Current Outpatient Medications:  .  amLODipine (NORVASC) 10 MG tablet, Take 10 mg by mouth daily. , Disp: , Rfl:  .  Ascorbic Acid (VITAMIN C) 1000 MG tablet, Take 1,000 mg by mouth daily., Disp: , Rfl:  .  aspirin EC 81 MG tablet, Take 81 mg by mouth at bedtime., Disp: , Rfl:  .  atorvastatin (LIPITOR) 20 MG tablet, Take 20 mg by mouth at bedtime., Disp: , Rfl:  .  docusate sodium (COLACE) 100 MG capsule, Take 100 mg by mouth every evening., Disp: , Rfl:  .  doxazosin (CARDURA) 4 MG tablet, Take 4 mg by mouth at bedtime., Disp: , Rfl:  .  empagliflozin (JARDIANCE) 25 MG TABS tablet, Take 25 mg by mouth daily., Disp: , Rfl:  .  Flaxseed, Linseed, (BIO-FLAX) 1000 MG CAPS, Take 1,000 mg by mouth daily., Disp: , Rfl:  .  glimepiride (AMARYL) 2 MG tablet, Take 6 mg by mouth daily. , Disp: , Rfl:  .  lisinopril-hydrochlorothiazide (ZESTORETIC) 20-25 MG tablet, Take 1 tablet by mouth daily., Disp: , Rfl:  .  metFORMIN (GLUCOPHAGE) 500 MG tablet, Take 500-1,000 mg by mouth See admin instructions. Take 1000 mg in the morning and 500 mg in the evening, Disp: , Rfl:  .  metoprolol succinate (TOPROL-XL) 25 MG 24 hr tablet, Take 0.5 tablets (12.5 mg total) by mouth daily., Disp: 30 tablet, Rfl: 11 .  nitroGLYCERIN (NITROSTAT) 0.4 MG SL tablet, Place 0.4 mg under the tongue every 5 (five) minutes as needed for chest pain., Disp: , Rfl:  .  Omega-3 Fatty Acids (FISH OIL) 1200 MG CAPS, Take 1,200 mg by mouth daily., Disp: , Rfl:  .   omeprazole (PRILOSEC) 40 MG capsule, Take 50 mg by mouth daily. , Disp: , Rfl:  .  OVER THE COUNTER MEDICATION, Apply 1 application topically daily. proclearz fungal shield, Disp: , Rfl:  .  Phenylephrine-Pheniramine (DRISTAN NA), Place 1 spray into the nose daily as needed (allergies)., Disp: , Rfl:  .  prasugrel (EFFIENT) 10 MG TABS tablet, Take 1 tablet (10 mg total) by mouth daily., Disp: 30 tablet, Rfl: 11 .  predniSONE (DELTASONE) 10 MG tablet, Take 10-12.5 mg by mouth See admin instructions. Take 12.5 mg every other day for 2 weeks, then 10 mg every other day for 2 weeks, 7.5 mg every other day for 2 weeks, 5 mg every other day for 2 weeks, 2.5 mg every other day for 2 weeks then stop, Disp: , Rfl:  .  zolpidem (AMBIEN) 10 MG tablet, Take 5 mg by mouth at bedtime as needed for sleep., Disp: , Rfl:   Past Medical History: Past Medical History:  Diagnosis Date  . CAD (coronary artery disease)   . Diabetes mellitus without complication ([place])   . Hyperlipidemia   . Hypertension   . Pemphigus     Tobacco Use: Social History   Tobacco Use  Smoking Status Current Some Day Smoker  . Packs/day: 0.50  . Last attempt to  quit: 11/2016  . Years since quitting: 2.8  Smokeless Tobacco Never Used  Tobacco Comment   may smoke 1 pack a week    Labs: Recent Review Flowsheet Data    Labs for ITP Cardiac and Pulmonary Rehab Latest Ref Rng & Units 08/26/2019   Hemoglobin A1c 4.8 - 5.6 % 8.2(H)       Exercise Target Goals: Exercise Program Goal: Individual exercise prescription set using results from initial 6 min walk test and THRR while considering  patient's activity barriers and safety.   Exercise Prescription Goal: Initial exercise prescription builds to 30-45 minutes a day of aerobic activity, 2-3 days per week.  Home exercise guidelines will be given to patient during program as part of exercise prescription that the participant will acknowledge.  Activity Barriers & Risk  Stratification: Activity Barriers & Cardiac Risk Stratification - 09/23/19 1624      Activity Barriers & Cardiac Risk Stratification   Activity Barriers  Neck/Spine Problems;Joint Problems   occasional knee pain   Cardiac Risk Stratification  Moderate       6 Minute Walk: 6 Minute Walk    Row Name 09/23/19 1623         6 Minute Walk   Phase  Initial     Distance  1430 feet     Walk Time  6 minutes     # of Rest Breaks  0     MPH  2.71     METS  3.36     RPE  7     VO2 Peak  11.78     Symptoms  No     Resting HR  71 bpm     Resting BP  126/70     Resting Oxygen Saturation   98 %     Exercise Oxygen Saturation  during 6 min walk  99 %     Max Ex. HR  74 bpm     Max Ex. BP  142/74     2 Minute Post BP  120/70        Oxygen Initial Assessment:   Oxygen Re-Evaluation:   Oxygen Discharge (Final Oxygen Re-Evaluation):   Initial Exercise Prescription: Initial Exercise Prescription - 09/23/19 1600      Date of Initial Exercise RX and Referring Provider   Date  09/23/19    Referring Provider  Paraschos, Alexander MD      Treadmill   MPH  2.7    Grade  1    Minutes  15    METs  3.44      Recumbant Bike   Level  3    RPM  50    Watts  43    Minutes  15    METs  3      NuStep   Level  3    SPM  80    Minutes  15    METs  3      Recumbant Elliptical   Level  2    RPM  50    Minutes  15    METs  3      T5 Nustep   Level  3    SPM  80    Minutes  15    METs  3      Biostep-RELP   Level  3    SPM  50    Minutes  15    METs  3      Prescription Details  Frequency (times per week)  3    Duration  Progress to 30 minutes of continuous aerobic without signs/symptoms of physical distress      Intensity   THRR 40-80% of Max Heartrate  106-144    Ratings of Perceived Exertion  11-13    Perceived Dyspnea  0-4      Progression   Progression  Continue to progress workloads to maintain intensity without signs/symptoms of physical distress.       Resistance Training   Training Prescription  Yes    Weight  4 lbs    Reps  10-15       Perform Capillary Blood Glucose checks as needed.  Exercise Prescription Changes: Exercise Prescription Changes    Row Name 09/23/19 1600             Response to Exercise   Blood Pressure (Admit)  126/70       Blood Pressure (Exercise)  142/74       Blood Pressure (Exit)  120/70       Heart Rate (Admit)  71 bpm       Heart Rate (Exercise)  74 bpm       Heart Rate (Exit)  60 bpm       Oxygen Saturation (Admit)  98 %       Oxygen Saturation (Exercise)  99 %       Rating of Perceived Exertion (Exercise)  7       Symptoms  none       Comments  walk test results          Exercise Comments:   Exercise Goals and Review: Exercise Goals    Row Name 09/23/19 1626             Exercise Goals   Increase Physical Activity  Yes       Intervention  Provide advice, education, support and counseling about physical activity/exercise needs.;Develop an individualized exercise prescription for aerobic and resistive training based on initial evaluation findings, risk stratification, comorbidities and participant's personal goals.       Expected Outcomes  Short Term: Attend rehab on a regular basis to increase amount of physical activity.;Long Term: Add in home exercise to make exercise part of routine and to increase amount of physical activity.;Long Term: Exercising regularly at least 3-5 days a week.       Increase Strength and Stamina  Yes       Intervention  Provide advice, education, support and counseling about physical activity/exercise needs.;Develop an individualized exercise prescription for aerobic and resistive training based on initial evaluation findings, risk stratification, comorbidities and participant's personal goals.       Expected Outcomes  Short Term: Increase workloads from initial exercise prescription for resistance, speed, and METs.;Short Term: Perform resistance training  exercises routinely during rehab and add in resistance training at home;Long Term: Improve cardiorespiratory fitness, muscular endurance and strength as measured by increased METs and functional capacity (6MWT)       Able to understand and use rate of perceived exertion (RPE) scale  Yes       Intervention  Provide education and explanation on how to use RPE scale       Expected Outcomes  Short Term: Able to use RPE daily in rehab to express subjective intensity level;Long Term:  Able to use RPE to guide intensity level when exercising independently       Knowledge and understanding of Target Heart Rate Range (THRR)  Yes  Intervention  Provide education and explanation of THRR including how the numbers were predicted and where they are located for reference       Expected Outcomes  Short Term: Able to state/look up THRR;Short Term: Able to use daily as guideline for intensity in rehab;Long Term: Able to use THRR to govern intensity when exercising independently       Able to check pulse independently  Yes       Intervention  Provide education and demonstration on how to check pulse in carotid and radial arteries.;Review the importance of being able to check your own pulse for safety during independent exercise       Expected Outcomes  Long Term: Able to check pulse independently and accurately;Short Term: Able to explain why pulse checking is important during independent exercise       Understanding of Exercise Prescription  Yes       Intervention  Provide education, explanation, and written materials on patient's individual exercise prescription       Expected Outcomes  Long Term: Able to explain home exercise prescription to exercise independently;Short Term: Able to explain program exercise prescription          Exercise Goals Re-Evaluation :   Discharge Exercise Prescription (Final Exercise Prescription Changes): Exercise Prescription Changes - 09/23/19 1600      Response to Exercise    Blood Pressure (Admit)  126/70    Blood Pressure (Exercise)  142/74    Blood Pressure (Exit)  120/70    Heart Rate (Admit)  71 bpm    Heart Rate (Exercise)  74 bpm    Heart Rate (Exit)  60 bpm    Oxygen Saturation (Admit)  98 %    Oxygen Saturation (Exercise)  99 %    Rating of Perceived Exertion (Exercise)  7    Symptoms  none    Comments  walk test results       Nutrition:  Target Goals: Understanding of nutrition guidelines, daily intake of sodium '1500mg'$ , cholesterol '200mg'$ , calories 30% from fat and 7% or less from saturated fats, daily to have 5 or more servings of fruits and vegetables.  Biometrics: Pre Biometrics - 09/23/19 1627      Pre Biometrics   Height  6' 0.1" (1.831 m)    Weight  218 lb 6.4 oz (99.1 kg)    BMI (Calculated)  29.55    Single Leg Stand  30 seconds        Nutrition Therapy Plan and Nutrition Goals:   Nutrition Assessments: Nutrition Assessments - 09/18/19 1150      MEDFICTS Scores   Pre Score  30       Nutrition Goals Re-Evaluation:   Nutrition Goals Discharge (Final Nutrition Goals Re-Evaluation):   Psychosocial: Target Goals: Acknowledge presence or absence of significant depression and/or stress, maximize coping skills, provide positive support system. Participant is able to verbalize types and ability to use techniques and skills needed for reducing stress and depression.   Initial Review & Psychosocial Screening: Initial Psych Review & Screening - 09/18/19 1113      Initial Review   Current issues with  Current Sleep Concerns;Current Stress Concerns   takes ambien but gets at least 6 hrs each night   Source of Stress Concerns  Occupation    Comments  Work is his major stressor. 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      Family Dynamics   Good Support System?  Yes   family, sister, brother, son, girlfrend, friends     Barriers   Psychosocial barriers to participate in program  The patient  should benefit from training in stress management and relaxation.;Psychosocial barriers identified (see note)      Screening Interventions   Interventions  Encouraged to exercise;To provide support and resources with identified psychosocial needs;Provide feedback about the scores to participant    Expected Outcomes  Short Term goal: Utilizing psychosocial counselor, staff and physician to assist with identification of specific Stressors or current issues interfering with healing process. Setting desired goal for each stressor or current issue identified.;Long Term Goal: Stressors or current issues are controlled or eliminated.;Short Term goal: Identification and review with participant of any Quality of Life or Depression concerns found by scoring the questionnaire.;Long Term goal: The participant improves quality of Life and PHQ9 Scores as seen by post scores and/or verbalization of changes       Quality of Life Scores:  Quality of Life - 09/18/19 1150      Quality of Life   Select  Quality of Life      Quality of Life Scores   Health/Function Pre  23.73 %    Socioeconomic Pre  24.5 %    Psych/Spiritual Pre  27.85 %    Family Pre  27.6 %    GLOBAL Pre  25.28 %      Scores of 19 and below usually indicate a poorer quality of life in these areas.  A difference of  2-3 points is a clinically meaningful difference.  A difference of 2-3 points in the total score of the Quality of Life Index has been associated with significant improvement in overall quality of life, self-image, physical symptoms, and general health in studies assessing change in quality of life.  PHQ-9: Recent Review Flowsheet Data    Depression screen [facility] 2/9 09/23/2019   Decreased Interest 0   Down, Depressed, Hopeless 0   PHQ - 2 Score 0   Altered sleeping 0   Tired, decreased energy 0   Change in appetite 0   Feeling bad or failure about yourself  0   Trouble concentrating 0   Moving slowly or fidgety/restless 0    Suicidal thoughts 0   PHQ-9 Score 0   Difficult doing work/chores Not difficult at all     Interpretation of Total Score  Total Score Depression Severity:  1-4 = Minimal depression, 5-9 = Mild depression, 10-14 = Moderate depression, 15-19 = Moderately severe depression, 20-27 = Severe depression   Psychosocial Evaluation and Intervention: Psychosocial Evaluation - 09/18/19 1106      Psychosocial Evaluation & Interventions   Comments  He has an autoimmune disease and has been on prednisone since February with taper now until 12/7.  Currently also working on quitting smoking. He is back to work already and has a stressful job.       Psychosocial Re-Evaluation:   Psychosocial Discharge (Final Psychosocial Re-Evaluation):   Vocational Rehabilitation: Provide vocational rehab assistance to qualifying candidates.   Vocational Rehab Evaluation & Intervention: Vocational Rehab - 09/18/19 1116      Initial Vocational Rehab Evaluation & Intervention   Assessment shows need for Vocational Rehabilitation  No       Education: Education Goals: Education classes will be provided on a variety of topics geared toward better understanding of heart health and risk factor modification. Participant will state understanding/return demonstration of topics presented as noted by education test scores.  Learning Barriers/Preferences:  Learning Barriers/Preferences - 09/18/19 1116      Learning Barriers/Preferences   Learning Barriers  Sight   reading glasses   Learning Preferences  None       Education Topics:  AED/CPR: - Group verbal and written instruction with the use of models to demonstrate the basic use of the AED with the basic ABC's of resuscitation.   General Nutrition Guidelines/Fats and Fiber: -Group instruction provided by verbal, written material, models and posters to present the general guidelines for heart healthy nutrition. Gives an explanation and review of dietary fats  and fiber.   Controlling Sodium/Reading Food Labels: -Group verbal and written material supporting the discussion of sodium use in heart healthy nutrition. Review and explanation with models, verbal and written materials for utilization of the food label.   Exercise Physiology & General Exercise Guidelines: - Group verbal and written instruction with models to review the exercise physiology of the cardiovascular system and associated critical values. Provides general exercise guidelines with specific guidelines to those with heart or lung disease.    Aerobic Exercise & Resistance Training: - Gives group verbal and written instruction on the various components of exercise. Focuses on aerobic and resistive training programs and the benefits of this training and how to safely progress through these programs..   Flexibility, Balance, Mind/Body Relaxation: Provides group verbal/written instruction on the benefits of flexibility and balance training, including mind/body exercise modes such as yoga, pilates and tai chi.  Demonstration and skill practice provided.   Stress and Anxiety: - Provides group verbal and written instruction about the health risks of elevated stress and causes of high stress.  Discuss the correlation between heart/lung disease and anxiety and treatment options. Review healthy ways to manage with stress and anxiety.   Depression: - Provides group verbal and written instruction on the correlation between heart/lung disease and depressed mood, treatment options, and the stigmas associated with seeking treatment.   Anatomy & Physiology of the Heart: - Group verbal and written instruction and models provide basic cardiac anatomy and physiology, with the coronary electrical and arterial systems. Review of Valvular disease and Heart Failure   Cardiac Procedures: - Group verbal and written instruction to review commonly prescribed medications for heart disease. Reviews the  medication, class of the drug, and side effects. Includes the steps to properly store meds and maintain the prescription regimen. (beta blockers and nitrates)   Cardiac Medications I: - Group verbal and written instruction to review commonly prescribed medications for heart disease. Reviews the medication, class of the drug, and side effects. Includes the steps to properly store meds and maintain the prescription regimen.   Cardiac Medications II: -Group verbal and written instruction to review commonly prescribed medications for heart disease. Reviews the medication, class of the drug, and side effects. (all other drug classes)    Go Sex-Intimacy & Heart Disease, Get SMART - Goal Setting: - Group verbal and written instruction through game format to discuss heart disease and the return to sexual intimacy. Provides group verbal and written material to discuss and apply goal setting through the application of the S.M.A.R.T. Method.   Other Matters of the Heart: - Provides group verbal, written materials and models to describe Stable Angina and Peripheral Artery. Includes description of the disease process and treatment options available to the cardiac patient.   Exercise & Equipment Safety: - Individual verbal instruction and demonstration of equipment use and safety with use of the equipment.   Cardiac Rehab from 09/23/2019 in [facility]  Cardiac and Pulmonary Rehab  Date  09/23/19  Educator  [facility]  Instruction Review Code  1- Verbalizes Understanding      Infection Prevention: - Provides verbal and written material to individual with discussion of infection control including proper hand washing and proper equipment cleaning during exercise session.   Cardiac Rehab from 09/23/2019 in [facility] Cardiac and Pulmonary Rehab  Date  09/23/19  Educator  [facility]  Instruction Review Code  1- Verbalizes Understanding      Falls Prevention: - Provides verbal and written material to individual with discussion  of falls prevention and safety.   Cardiac Rehab from 09/23/2019 in [facility] Cardiac and Pulmonary Rehab  Date  09/23/19  Educator  [facility]  Instruction Review Code  1- Verbalizes Understanding      Diabetes: - Individual verbal and written instruction to review signs/symptoms of diabetes, desired ranges of glucose level fasting, after meals and with exercise. Acknowledge that pre and post exercise glucose checks will be done for 3 sessions at entry of program.   Know Your Numbers and Risk Factors: -Group verbal and written instruction about important numbers in your health.  Discussion of what are risk factors and how they play a role in the disease process.  Review of Cholesterol, Blood Pressure, Diabetes, and BMI and the role they play in your overall health.   Sleep Hygiene: -Provides group verbal and written instruction about how sleep can affect your health.  Define sleep hygiene, discuss sleep cycles and impact of sleep habits. Review good sleep hygiene tips.    Other: -Provides group and verbal instruction on various topics (see comments)   Knowledge Questionnaire Score: Knowledge Questionnaire Score - 09/18/19 1151      Knowledge Questionnaire Score   Pre Score  25/26 Education Focus: Angina       Core Components/Risk Factors/Patient Goals at Admission: Personal Goals and Risk Factors at Admission - 09/23/19 1627      Core Components/Risk Factors/Patient Goals on Admission    Weight Management  Yes;Weight Loss    Intervention  Weight Management: Develop a combined nutrition and exercise program designed to reach desired caloric intake, while maintaining appropriate intake of nutrient and fiber, sodium and fats, and appropriate energy expenditure required for the weight goal.;Weight Management: Provide education and appropriate resources to help participant work on and attain dietary goals.    Admit Weight  218 lb 6.4 oz (99.1 kg)    Goal Weight: Short Term  213 lb (96.6 kg)     Goal Weight: Long Term  200 lb (90.7 kg)    Expected Outcomes  Short Term: Continue to assess and modify interventions until short term weight is achieved;Long Term: Adherence to nutrition and physical activity/exercise program aimed toward attainment of established weight goal;Weight Loss: Understanding of general recommendations for a balanced deficit meal plan, which promotes 1-2 lb weight loss per week and includes a negative energy balance of [phone] kcal/d;Understanding recommendations for meals to include 15-35% energy as protein, 25-35% energy from fat, 35-60% energy from carbohydrates, less than '200mg'$  of dietary cholesterol, 20-35 gm of total fiber daily;Understanding of distribution of calorie intake throughout the day with the consumption of 4-5 meals/snacks    Tobacco Cessation  Yes    Number of packs per day  0.5    Intervention  Assist the participant in steps to quit. Provide individualized education and counseling about committing to Tobacco Cessation, relapse prevention, and pharmacological support that can be provided by physician.;Advice worker, assist with locating  and accessing local/national Quit Smoking programs, and support quit date choice.    Expected Outcomes  Short Term: Will quit all tobacco product use, adhering to prevention of relapse plan.;Short Term: Will demonstrate readiness to quit, by selecting a quit date.;Long Term: Complete abstinence from all tobacco products for at least 12 months from quit date.    Diabetes  Yes    Intervention  Provide education about signs/symptoms and action to take for hypo/hyperglycemia.;Provide education about proper nutrition, including hydration, and aerobic/resistive exercise prescription along with prescribed medications to achieve blood glucose in normal ranges: Fasting glucose 65-99 mg/dL    Expected Outcomes  Short Term: Participant verbalizes understanding of the signs/symptoms and immediate care of  hyper/hypoglycemia, proper foot care and importance of medication, aerobic/resistive exercise and nutrition plan for blood glucose control.;Long Term: Attainment of HbA1C < 7%.    Hypertension  Yes    Intervention  Provide education on lifestyle modifcations including regular physical activity/exercise, weight management, moderate sodium restriction and increased consumption of fresh fruit, vegetables, and low fat dairy, alcohol moderation, and smoking cessation.;Monitor prescription use compliance.    Expected Outcomes  Short Term: Continued assessment and intervention until BP is < 140/98m HG in hypertensive participants. < 130/876mHG in hypertensive participants with diabetes, heart failure or chronic kidney disease.;Long Term: Maintenance of blood pressure at goal levels.    Lipids  Yes    Intervention  Provide education and support for participant on nutrition & aerobic/resistive exercise along with prescribed medications to achieve LDL '70mg'$ , HDL >'40mg'$ .    Expected Outcomes  Short Term: Participant states understanding of desired cholesterol values and is compliant with medications prescribed. Participant is following exercise prescription and nutrition guidelines.;Long Term: Cholesterol controlled with medications as prescribed, with individualized exercise RX and with personalized nutrition plan. Value goals: LDL < '70mg'$ , HDL > 40 mg.       Core Components/Risk Factors/Patient Goals Review:    Core Components/Risk Factors/Patient Goals at Discharge (Final Review):    ITP Comments: ITP Comments    Row Name 09/18/19 1125 09/23/19 1623         ITP Comments  Completed virtual orientation today.  [facility]  EP eval scheduled for 11/9.  Completed 6MWT and gym orientation.  Initial ITP created and sent for review to Dr. MaEmily FilbertMedical Director.         Comments: Initial ITP

## 2019-09-23 NOTE — Patient Instructions (Signed)
Patient Instructions  Patient Details  Name: Hector Garcia MRN: [id number] Date of Birth: [birth year] Referring Provider:  Isaias Cowman, MD  Below are your personal goals for exercise, nutrition, and risk factors. Our goal is to help you stay on track towards obtaining and maintaining these goals. We will be discussing your progress on these goals with you throughout the program.  Initial Exercise Prescription: Initial Exercise Prescription - 09/23/19 1600      Date of Initial Exercise RX and Referring Provider   Date  09/23/19    Referring Provider  Paraschos, Alexander MD      Treadmill   MPH  2.7    Grade  1    Minutes  15    METs  3.44      Recumbant Bike   Level  3    RPM  50    Watts  43    Minutes  15    METs  3      NuStep   Level  3    SPM  80    Minutes  15    METs  3      Recumbant Elliptical   Level  2    RPM  50    Minutes  15    METs  3      T5 Nustep   Level  3    SPM  80    Minutes  15    METs  3      Biostep-RELP   Level  3    SPM  50    Minutes  15    METs  3      Prescription Details   Frequency (times per week)  3    Duration  Progress to 30 minutes of continuous aerobic without signs/symptoms of physical distress      Intensity   THRR 40-80% of Max Heartrate  106-144    Ratings of Perceived Exertion  11-13    Perceived Dyspnea  0-4      Progression   Progression  Continue to progress workloads to maintain intensity without signs/symptoms of physical distress.      Resistance Training   Training Prescription  Yes    Weight  4 lbs    Reps  10-15       Exercise Goals: Frequency: Be able to perform aerobic exercise two to three times per week in program working toward 2-5 days per week of home exercise.  Intensity: Work with a perceived exertion of 11 (fairly light) - 15 (hard) while following your exercise prescription.  We will make changes to your prescription with you as you progress through the program.    Duration: Be able to do 30 to 45 minutes of continuous aerobic exercise in addition to a 5 minute warm-up and a 5 minute cool-down routine.   Nutrition Goals: Your personal nutrition goals will be established when you do your nutrition analysis with the dietician.  The following are general nutrition guidelines to follow: Cholesterol < 200mg /day Sodium < 1500mg /day Fiber: Men over 50 yrs - 30 grams per day  Personal Goals: Personal Goals and Risk Factors at Admission - 09/23/19 1627      Core Components/Risk Factors/Patient Goals on Admission    Weight Management  Yes;Weight Loss    Intervention  Weight Management: Develop a combined nutrition and exercise program designed to reach desired caloric intake, while maintaining appropriate intake of nutrient and fiber, sodium and fats, and appropriate  energy expenditure required for the weight goal.;Weight Management: Provide education and appropriate resources to help participant work on and attain dietary goals.    Admit Weight  218 lb 6.4 oz (99.1 kg)    Goal Weight: Short Term  213 lb (96.6 kg)    Goal Weight: Long Term  200 lb (90.7 kg)    Expected Outcomes  Short Term: Continue to assess and modify interventions until short term weight is achieved;Long Term: Adherence to nutrition and physical activity/exercise program aimed toward attainment of established weight goal;Weight Loss: Understanding of general recommendations for a balanced deficit meal plan, which promotes 1-2 lb weight loss per week and includes a negative energy balance of [phone] kcal/d;Understanding recommendations for meals to include 15-35% energy as protein, 25-35% energy from fat, 35-60% energy from carbohydrates, less than 200mg  of dietary cholesterol, 20-35 gm of total fiber daily;Understanding of distribution of calorie intake throughout the day with the consumption of 4-5 meals/snacks    Tobacco Cessation  Yes    Number of packs per day  0.5    Intervention  Assist  the participant in steps to quit. Provide individualized education and counseling about committing to Tobacco Cessation, relapse prevention, and pharmacological support that can be provided by physician.;Advice worker, assist with locating and accessing local/national Quit Smoking programs, and support quit date choice.    Expected Outcomes  Short Term: Will quit all tobacco product use, adhering to prevention of relapse plan.;Short Term: Will demonstrate readiness to quit, by selecting a quit date.;Long Term: Complete abstinence from all tobacco products for at least 12 months from quit date.    Diabetes  Yes    Intervention  Provide education about signs/symptoms and action to take for hypo/hyperglycemia.;Provide education about proper nutrition, including hydration, and aerobic/resistive exercise prescription along with prescribed medications to achieve blood glucose in normal ranges: Fasting glucose 65-99 mg/dL    Expected Outcomes  Short Term: Participant verbalizes understanding of the signs/symptoms and immediate care of hyper/hypoglycemia, proper foot care and importance of medication, aerobic/resistive exercise and nutrition plan for blood glucose control.;Long Term: Attainment of HbA1C < 7%.    Hypertension  Yes    Intervention  Provide education on lifestyle modifcations including regular physical activity/exercise, weight management, moderate sodium restriction and increased consumption of fresh fruit, vegetables, and low fat dairy, alcohol moderation, and smoking cessation.;Monitor prescription use compliance.    Expected Outcomes  Short Term: Continued assessment and intervention until BP is < 140/41mm HG in hypertensive participants. < 130/95mm HG in hypertensive participants with diabetes, heart failure or chronic kidney disease.;Long Term: Maintenance of blood pressure at goal levels.    Lipids  Yes    Intervention  Provide education and support for participant on nutrition  & aerobic/resistive exercise along with prescribed medications to achieve LDL 70mg , HDL >40mg .    Expected Outcomes  Short Term: Participant states understanding of desired cholesterol values and is compliant with medications prescribed. Participant is following exercise prescription and nutrition guidelines.;Long Term: Cholesterol controlled with medications as prescribed, with individualized exercise RX and with personalized nutrition plan. Value goals: LDL < 70mg , HDL > 40 mg.       Tobacco Use Initial Evaluation: Social History   Tobacco Use  Smoking Status Current Some Day Smoker  . Packs/day: 0.50  . Last attempt to quit: 11/2016  . Years since quitting: 2.8  Smokeless Tobacco Never Used  Tobacco Comment   may smoke 1 pack a week    Exercise Goals and  Review: Exercise Goals    Row Name 09/23/19 1626             Exercise Goals   Increase Physical Activity  Yes       Intervention  Provide advice, education, support and counseling about physical activity/exercise needs.;Develop an individualized exercise prescription for aerobic and resistive training based on initial evaluation findings, risk stratification, comorbidities and participant's personal goals.       Expected Outcomes  Short Term: Attend rehab on a regular basis to increase amount of physical activity.;Long Term: Add in home exercise to make exercise part of routine and to increase amount of physical activity.;Long Term: Exercising regularly at least 3-5 days a week.       Increase Strength and Stamina  Yes       Intervention  Provide advice, education, support and counseling about physical activity/exercise needs.;Develop an individualized exercise prescription for aerobic and resistive training based on initial evaluation findings, risk stratification, comorbidities and participant's personal goals.       Expected Outcomes  Short Term: Increase workloads from initial exercise prescription for resistance, speed, and  METs.;Short Term: Perform resistance training exercises routinely during rehab and add in resistance training at home;Long Term: Improve cardiorespiratory fitness, muscular endurance and strength as measured by increased METs and functional capacity (6MWT)       Able to understand and use rate of perceived exertion (RPE) scale  Yes       Intervention  Provide education and explanation on how to use RPE scale       Expected Outcomes  Short Term: Able to use RPE daily in rehab to express subjective intensity level;Long Term:  Able to use RPE to guide intensity level when exercising independently       Knowledge and understanding of Target Heart Rate Range (THRR)  Yes       Intervention  Provide education and explanation of THRR including how the numbers were predicted and where they are located for reference       Expected Outcomes  Short Term: Able to state/look up THRR;Short Term: Able to use daily as guideline for intensity in rehab;Long Term: Able to use THRR to govern intensity when exercising independently       Able to check pulse independently  Yes       Intervention  Provide education and demonstration on how to check pulse in carotid and radial arteries.;Review the importance of being able to check your own pulse for safety during independent exercise       Expected Outcomes  Long Term: Able to check pulse independently and accurately;Short Term: Able to explain why pulse checking is important during independent exercise       Understanding of Exercise Prescription  Yes       Intervention  Provide education, explanation, and written materials on patient's individual exercise prescription       Expected Outcomes  Long Term: Able to explain home exercise prescription to exercise independently;Short Term: Able to explain program exercise prescription          Copy of goals given to participant.

## 2019-09-30 ENCOUNTER — Encounter: Payer: 59 | Admitting: *Deleted

## 2019-09-30 ENCOUNTER — Other Ambulatory Visit: Payer: Self-pay

## 2019-09-30 DIAGNOSIS — Z955 Presence of coronary angioplasty implant and graft: Secondary | ICD-10-CM | POA: Diagnosis not present

## 2019-09-30 LAB — GLUCOSE, CAPILLARY
Glucose-Capillary: 153 mg/dL — ABNORMAL HIGH (ref 70–99)
Glucose-Capillary: 102 mg/dL — ABNORMAL HIGH (ref 70–99)

## 2019-09-30 NOTE — Progress Notes (Signed)
Daily Session Note  Patient Details  Name: Hector Garcia MRN: [id number] Date of Birth: 1956-08-01 Referring Provider:     Cardiac Rehab from 09/23/2019 in [facility] Cardiac and Pulmonary Rehab  Referring Provider  Isaias Cowman MD      Encounter Date: 09/30/2019  Check In: Session Check In - 09/30/19 1652      Check-In   Supervising physician immediately available to respond to emergencies  See telemetry face sheet for immediately available ER MD    Location  [facility]-Cardiac & Pulmonary Rehab    Staff Present  Renita Papa, RN Moises Blood, BS, ACSM CEP, Exercise Physiologist;Joseph Tessie Fass RCP,RRT,BSRT    Virtual Visit  No    Medication changes reported      No    Fall or balance concerns reported     No    Warm-up and Cool-down  Performed on first and last piece of equipment    Resistance Training Performed  Yes    VAD Patient?  No    PAD/SET Patient?  No      Pain Assessment   Currently in Pain?  No/denies          Social History   Tobacco Use  Smoking Status Current Some Day Smoker  . Packs/day: 0.50  . Last attempt to quit: 11/2016  . Years since quitting: 2.8  Smokeless Tobacco Never Used  Tobacco Comment   may smoke 1 pack a week    Goals Met:  Independence with exercise equipment Exercise tolerated well No report of cardiac concerns or symptoms Strength training completed today  Goals Unmet:  Not Applicable  Comments: First full day of exercise!  Patient was oriented to gym and equipment including functions, settings, policies, and procedures.  Patient's individual exercise prescription and treatment plan were reviewed.  All starting workloads were established based on the results of the 6 minute walk test done at initial orientation visit.  The plan for exercise progression was also introduced and progression will be customized based on patient's performance and goals.    Dr. Emily Filbert is Medical Director for [place] and [facility].

## 2019-10-02 ENCOUNTER — Encounter: Payer: 59 | Admitting: *Deleted

## 2019-10-02 ENCOUNTER — Other Ambulatory Visit: Payer: Self-pay

## 2019-10-02 DIAGNOSIS — Z955 Presence of coronary angioplasty implant and graft: Secondary | ICD-10-CM

## 2019-10-02 LAB — GLUCOSE, CAPILLARY
Glucose-Capillary: 185 mg/dL — ABNORMAL HIGH (ref 70–99)
Glucose-Capillary: 147 mg/dL — ABNORMAL HIGH (ref 70–99)

## 2019-10-02 NOTE — Progress Notes (Signed)
Daily Session Note  Patient Details  Name: TOR TSUDA MRN: [id number] Date of Birth: [birth year] Referring Provider:     Cardiac Rehab from 09/23/2019 in [facility] Cardiac and Pulmonary Rehab  Referring Provider  Isaias Cowman MD      Encounter Date: 10/02/2019  Check In: Session Check In - 10/02/19 1650      Check-In   Supervising physician immediately available to respond to emergencies  See telemetry face sheet for immediately available ER MD    Location  [facility]-Cardiac & Pulmonary Rehab    Staff Present  Renita Papa, RN Vickki Hearing, BA, ACSM CEP, Exercise Physiologist;Melissa Caiola RDN, LDN    Virtual Visit  No    Medication changes reported      No    Fall or balance concerns reported     No    Tobacco Cessation  No Change   less than a pack/day   Warm-up and Cool-down  Performed on first and last piece of equipment    Resistance Training Performed  Yes    VAD Patient?  No    PAD/SET Patient?  No      Pain Assessment   Currently in Pain?  No/denies          Social History   Tobacco Use  Smoking Status Current Some Day Smoker  . Packs/day: 0.50  . Last attempt to quit: 11/2016  . Years since quitting: 2.8  Smokeless Tobacco Never Used  Tobacco Comment   may smoke 1 pack a week    Goals Met:  Independence with exercise equipment Exercise tolerated well No report of cardiac concerns or symptoms Strength training completed today  Goals Unmet:  Not Applicable  Comments: Pt able to follow exercise prescription today without complaint.  Will continue to monitor for progression.    Dr. Emily Filbert is Medical Director for [place] and [facility].

## 2019-10-07 ENCOUNTER — Other Ambulatory Visit: Payer: Self-pay

## 2019-10-07 ENCOUNTER — Encounter: Payer: 59 | Admitting: *Deleted

## 2019-10-07 DIAGNOSIS — Z955 Presence of coronary angioplasty implant and graft: Secondary | ICD-10-CM

## 2019-10-07 NOTE — Progress Notes (Signed)
Daily Session Note  Patient Details  Name: Hector Garcia MRN: [id number] Date of Birth: 05/31/1956 Referring Provider:     Cardiac Rehab from 09/23/2019 in [facility] Cardiac and Pulmonary Rehab  Referring Provider  Paraschos, Alexander MD      Encounter Date: 10/07/2019  Check In: Session Check In - 10/07/19 1702      Check-In   Supervising physician immediately available to respond to emergencies  See telemetry face sheet for immediately available ER MD    Location  [facility]-Cardiac & Pulmonary Rehab    Staff Present  Meredith Craven, RN BSN;Kelly Hayes, BS, ACSM CEP, Exercise Physiologist;Jeanna Durrell BS, Exercise Physiologist    Virtual Visit  No    Medication changes reported      No    Fall or balance concerns reported     No    Tobacco Cessation  No Change   10 cigs   Warm-up and Cool-down  Performed on first and last piece of equipment    Resistance Training Performed  Yes    VAD Patient?  No    PAD/SET Patient?  No      Pain Assessment   Currently in Pain?  No/denies          Social History   Tobacco Use  Smoking Status Current Some Day Smoker  . Packs/day: 0.50  . Last attempt to quit: 11/2016  . Years since quitting: 2.8  Smokeless Tobacco Never Used  Tobacco Comment   may smoke 1 pack a week    Goals Met:  Independence with exercise equipment Exercise tolerated well No report of cardiac concerns or symptoms Strength training completed today  Goals Unmet:  Not Applicable  Comments: Pt able to follow exercise prescription today without complaint.  Will continue to monitor for progression.    Dr. Mark Miller is Medical Director for [facility] and [facility]. 

## 2019-10-09 ENCOUNTER — Encounter: Payer: 59 | Admitting: *Deleted

## 2019-10-09 ENCOUNTER — Other Ambulatory Visit: Payer: Self-pay

## 2019-10-09 DIAGNOSIS — Z955 Presence of coronary angioplasty implant and graft: Secondary | ICD-10-CM

## 2019-10-09 LAB — GLUCOSE, CAPILLARY
Glucose-Capillary: 126 mg/dL — ABNORMAL HIGH (ref 70–99)
Glucose-Capillary: 72 mg/dL (ref 70–99)

## 2019-10-09 NOTE — Progress Notes (Signed)
Daily Session Note  Patient Details  Name: Hector Garcia MRN: [id number] Date of Birth: 06/12/1956 Referring Provider:     Cardiac Rehab from 09/23/2019 in [facility] Cardiac and Pulmonary Rehab  Referring Provider  Paraschos, Alexander MD      Encounter Date: 10/09/2019  Check In: Session Check In - 10/09/19 1655      Check-In   Supervising physician immediately available to respond to emergencies  See telemetry face sheet for immediately available ER MD    Location  [facility]-Cardiac & Pulmonary Rehab    Staff Present   , RN BSN;Amanda Sommer, BA, ACSM CEP, Exercise Physiologist;Melissa Caiola RDN, LDN    Virtual Visit  No    Medication changes reported      No    Fall or balance concerns reported     No    Warm-up and Cool-down  Performed on first and last piece of equipment    Resistance Training Performed  Yes    VAD Patient?  No    PAD/SET Patient?  No      Pain Assessment   Currently in Pain?  No/denies          Social History   Tobacco Use  Smoking Status Current Some Day Smoker  . Packs/day: 0.50  . Last attempt to quit: 11/2016  . Years since quitting: 2.9  Smokeless Tobacco Never Used  Tobacco Comment   may smoke 1 pack a week    Goals Met:  Independence with exercise equipment Exercise tolerated well No report of cardiac concerns or symptoms Strength training completed today  Goals Unmet:  Not Applicable  Comments: Pt able to follow exercise prescription today without complaint.  Will continue to monitor for progression.    Dr. Mark Miller is Medical Director for [facility] and [facility]. 

## 2019-10-14 ENCOUNTER — Encounter: Payer: 59 | Admitting: *Deleted

## 2019-10-14 ENCOUNTER — Other Ambulatory Visit: Payer: Self-pay

## 2019-10-14 DIAGNOSIS — Z955 Presence of coronary angioplasty implant and graft: Secondary | ICD-10-CM | POA: Diagnosis not present

## 2019-10-14 NOTE — Progress Notes (Signed)
Daily Session Note  Patient Details  Name: Hector Garcia MRN: [id number] Date of Birth: 1956/06/22 Referring Provider:     Cardiac Rehab from 09/23/2019 in [facility] Cardiac and Pulmonary Rehab  Referring Provider  Isaias Cowman MD      Encounter Date: 10/14/2019  Check In: Session Check In - 10/14/19 1648      Check-In   Supervising physician immediately available to respond to emergencies  See telemetry face sheet for immediately available ER MD    Location  [facility]-Cardiac & Pulmonary Rehab    Staff Present  Renita Papa, RN Moises Blood, BS, ACSM CEP, Exercise Physiologist;Joseph Tessie Fass RCP,RRT,BSRT    Virtual Visit  No    Medication changes reported      No    Fall or balance concerns reported     No    Tobacco Cessation  No Change    Warm-up and Cool-down  Performed on first and last piece of equipment    Resistance Training Performed  Yes    VAD Patient?  No    PAD/SET Patient?  No      Pain Assessment   Currently in Pain?  No/denies          Social History   Tobacco Use  Smoking Status Current Some Day Smoker  . Packs/day: 0.50  . Last attempt to quit: 11/2016  . Years since quitting: 2.9  Smokeless Tobacco Never Used  Tobacco Comment   may smoke 1 pack a week    Goals Met:  Independence with exercise equipment Exercise tolerated well No report of cardiac concerns or symptoms Strength training completed today  Goals Unmet:  Not Applicable  Comments: Pt able to follow exercise prescription today without complaint.  Will continue to monitor for progression.    Dr. Emily Filbert is Medical Director for [place] and [facility].

## 2019-10-16 ENCOUNTER — Encounter: Payer: Self-pay | Admitting: *Deleted

## 2019-10-16 DIAGNOSIS — Z955 Presence of coronary angioplasty implant and graft: Secondary | ICD-10-CM

## 2019-10-16 NOTE — Progress Notes (Signed)
Cardiac Individual Treatment Plan  Patient Details  Name: Hector Garcia MRN: [id number] Date of Birth: [birth year] Referring Provider:     Cardiac Rehab from 09/23/2019 in [facility] Cardiac and Pulmonary Rehab  Referring Provider  Isaias Cowman MD      Initial Encounter Date:    Cardiac Rehab from 09/23/2019 in [facility] Cardiac and Pulmonary Rehab  Date  09/23/19      Visit Diagnosis: Status post coronary artery stent placement  Patient's Home Medications on Admission:  Current Outpatient Medications:  .  amLODipine (NORVASC) 10 MG tablet, Take 10 mg by mouth daily. , Disp: , Rfl:  .  Ascorbic Acid (VITAMIN C) 1000 MG tablet, Take 1,000 mg by mouth daily., Disp: , Rfl:  .  aspirin EC 81 MG tablet, Take 81 mg by mouth at bedtime., Disp: , Rfl:  .  atorvastatin (LIPITOR) 20 MG tablet, Take 20 mg by mouth at bedtime., Disp: , Rfl:  .  docusate sodium (COLACE) 100 MG capsule, Take 100 mg by mouth every evening., Disp: , Rfl:  .  doxazosin (CARDURA) 4 MG tablet, Take 4 mg by mouth at bedtime., Disp: , Rfl:  .  empagliflozin (JARDIANCE) 25 MG TABS tablet, Take 25 mg by mouth daily., Disp: , Rfl:  .  Flaxseed, Linseed, (BIO-FLAX) 1000 MG CAPS, Take 1,000 mg by mouth daily., Disp: , Rfl:  .  glimepiride (AMARYL) 2 MG tablet, Take 6 mg by mouth daily. , Disp: , Rfl:  .  lisinopril-hydrochlorothiazide (ZESTORETIC) 20-25 MG tablet, Take 1 tablet by mouth daily., Disp: , Rfl:  .  metFORMIN (GLUCOPHAGE) 500 MG tablet, Take 500-1,000 mg by mouth See admin instructions. Take 1000 mg in the morning and 500 mg in the evening, Disp: , Rfl:  .  metoprolol succinate (TOPROL-XL) 25 MG 24 hr tablet, Take 0.5 tablets (12.5 mg total) by mouth daily., Disp: 30 tablet, Rfl: 11 .  nitroGLYCERIN (NITROSTAT) 0.4 MG SL tablet, Place 0.4 mg under the tongue every 5 (five) minutes as needed for chest pain., Disp: , Rfl:  .  Omega-3 Fatty Acids (FISH OIL) 1200 MG CAPS, Take 1,200 mg by mouth daily., Disp: , Rfl:  .   omeprazole (PRILOSEC) 40 MG capsule, Take 50 mg by mouth daily. , Disp: , Rfl:  .  OVER THE COUNTER MEDICATION, Apply 1 application topically daily. proclearz fungal shield, Disp: , Rfl:  .  Phenylephrine-Pheniramine (DRISTAN NA), Place 1 spray into the nose daily as needed (allergies)., Disp: , Rfl:  .  prasugrel (EFFIENT) 10 MG TABS tablet, Take 1 tablet (10 mg total) by mouth daily., Disp: 30 tablet, Rfl: 11 .  predniSONE (DELTASONE) 10 MG tablet, Take 10-12.5 mg by mouth See admin instructions. Take 12.5 mg every other day for 2 weeks, then 10 mg every other day for 2 weeks, 7.5 mg every other day for 2 weeks, 5 mg every other day for 2 weeks, 2.5 mg every other day for 2 weeks then stop, Disp: , Rfl:  .  zolpidem (AMBIEN) 10 MG tablet, Take 5 mg by mouth at bedtime as needed for sleep., Disp: , Rfl:   Past Medical History: Past Medical History:  Diagnosis Date  . CAD (coronary artery disease)   . Diabetes mellitus without complication ([place])   . Hyperlipidemia   . Hypertension   . Pemphigus     Tobacco Use: Social History   Tobacco Use  Smoking Status Current Some Day Smoker  . Packs/day: 0.50  . Last attempt to  quit: 11/2016  . Years since quitting: 2.9  Smokeless Tobacco Never Used  Tobacco Comment   may smoke 1 pack a week    Labs: Recent Review Flowsheet Data    Labs for ITP Cardiac and Pulmonary Rehab Latest Ref Rng & Units 08/26/2019   Hemoglobin A1c 4.8 - 5.6 % 8.2(H)       Exercise Target Goals: Exercise Program Goal: Individual exercise prescription set using results from initial 6 min walk test and THRR while considering  patient's activity barriers and safety.   Exercise Prescription Goal: Initial exercise prescription builds to 30-45 minutes a day of aerobic activity, 2-3 days per week.  Home exercise guidelines will be given to patient during program as part of exercise prescription that the participant will acknowledge.  Activity Barriers & Risk  Stratification: Activity Barriers & Cardiac Risk Stratification - 09/23/19 1624      Activity Barriers & Cardiac Risk Stratification   Activity Barriers  Neck/Spine Problems;Joint Problems   occasional knee pain   Cardiac Risk Stratification  Moderate       6 Minute Walk: 6 Minute Walk    Row Name 09/23/19 1623         6 Minute Walk   Phase  Initial     Distance  1430 feet     Walk Time  6 minutes     # of Rest Breaks  0     MPH  2.71     METS  3.36     RPE  7     VO2 Peak  11.78     Symptoms  No     Resting HR  71 bpm     Resting BP  126/70     Resting Oxygen Saturation   98 %     Exercise Oxygen Saturation  during 6 min walk  99 %     Max Ex. HR  74 bpm     Max Ex. BP  142/74     2 Minute Post BP  120/70        Oxygen Initial Assessment:   Oxygen Re-Evaluation:   Oxygen Discharge (Final Oxygen Re-Evaluation):   Initial Exercise Prescription: Initial Exercise Prescription - 09/23/19 1600      Date of Initial Exercise RX and Referring Provider   Date  09/23/19    Referring Provider  Paraschos, Alexander MD      Treadmill   MPH  2.7    Grade  1    Minutes  15    METs  3.44      Recumbant Bike   Level  3    RPM  50    Watts  43    Minutes  15    METs  3      NuStep   Level  3    SPM  80    Minutes  15    METs  3      Recumbant Elliptical   Level  2    RPM  50    Minutes  15    METs  3      T5 Nustep   Level  3    SPM  80    Minutes  15    METs  3      Biostep-RELP   Level  3    SPM  50    Minutes  15    METs  3      Prescription Details  Frequency (times per week)  3    Duration  Progress to 30 minutes of continuous aerobic without signs/symptoms of physical distress      Intensity   THRR 40-80% of Max Heartrate  106-144    Ratings of Perceived Exertion  11-13    Perceived Dyspnea  0-4      Progression   Progression  Continue to progress workloads to maintain intensity without signs/symptoms of physical distress.       Resistance Training   Training Prescription  Yes    Weight  4 lbs    Reps  10-15       Perform Capillary Blood Glucose checks as needed.  Exercise Prescription Changes: Exercise Prescription Changes    Row Name 09/23/19 1600 10/03/19 1100           Response to Exercise   Blood Pressure (Admit)  126/70  120/68      Blood Pressure (Exercise)  142/74  148/54      Blood Pressure (Exit)  120/70  102/58      Heart Rate (Admit)  71 bpm  79 bpm      Heart Rate (Exercise)  74 bpm  105 bpm      Heart Rate (Exit)  60 bpm  73 bpm      Oxygen Saturation (Admit)  98 %  -      Oxygen Saturation (Exercise)  99 %  -      Rating of Perceived Exertion (Exercise)  7  12      Symptoms  none  none      Comments  walk test results  -        Resistance Training   Training Prescription  -  Yes      Weight  -  4 lb      Reps  -  10-15        Treadmill   MPH  -  2.7      Grade  -  1      Minutes  -  15      METs  -  3.44        NuStep   Level  -  4      SPM  -  80      Minutes  -  15      METs  -  2.8         Exercise Comments:   Exercise Goals and Review: Exercise Goals    Row Name 09/23/19 1626             Exercise Goals   Increase Physical Activity  Yes       Intervention  Provide advice, education, support and counseling about physical activity/exercise needs.;Develop an individualized exercise prescription for aerobic and resistive training based on initial evaluation findings, risk stratification, comorbidities and participant's personal goals.       Expected Outcomes  Short Term: Attend rehab on a regular basis to increase amount of physical activity.;Long Term: Add in home exercise to make exercise part of routine and to increase amount of physical activity.;Long Term: Exercising regularly at least 3-5 days a week.       Increase Strength and Stamina  Yes       Intervention  Provide advice, education, support and counseling about physical activity/exercise needs.;Develop  an individualized exercise prescription for aerobic and resistive training based on initial evaluation findings, risk stratification, comorbidities and participant's personal goals.  Expected Outcomes  Short Term: Increase workloads from initial exercise prescription for resistance, speed, and METs.;Short Term: Perform resistance training exercises routinely during rehab and add in resistance training at home;Long Term: Improve cardiorespiratory fitness, muscular endurance and strength as measured by increased METs and functional capacity (6MWT)       Able to understand and use rate of perceived exertion (RPE) scale  Yes       Intervention  Provide education and explanation on how to use RPE scale       Expected Outcomes  Short Term: Able to use RPE daily in rehab to express subjective intensity level;Long Term:  Able to use RPE to guide intensity level when exercising independently       Knowledge and understanding of Target Heart Rate Range (THRR)  Yes       Intervention  Provide education and explanation of THRR including how the numbers were predicted and where they are located for reference       Expected Outcomes  Short Term: Able to state/look up THRR;Short Term: Able to use daily as guideline for intensity in rehab;Long Term: Able to use THRR to govern intensity when exercising independently       Able to check pulse independently  Yes       Intervention  Provide education and demonstration on how to check pulse in carotid and radial arteries.;Review the importance of being able to check your own pulse for safety during independent exercise       Expected Outcomes  Long Term: Able to check pulse independently and accurately;Short Term: Able to explain why pulse checking is important during independent exercise       Understanding of Exercise Prescription  Yes       Intervention  Provide education, explanation, and written materials on patient's individual exercise prescription        Expected Outcomes  Long Term: Able to explain home exercise prescription to exercise independently;Short Term: Able to explain program exercise prescription          Exercise Goals Re-Evaluation : Exercise Goals Re-Evaluation    [place] Name 09/30/19 1653             Exercise Goal Re-Evaluation   Exercise Goals Review  Increase Physical Activity;Able to understand and use rate of perceived exertion (RPE) scale;Knowledge and understanding of Target Heart Rate Range (THRR);Understanding of Exercise Prescription;Increase Strength and Stamina;Able to check pulse independently       Comments  Reviewed RPE scale, THR and program prescription with pt today.  Pt voiced understanding and was given a copy of goals to take home.       Expected Outcomes  Short: Use RPE daily to regulate intensity. Long: Follow program prescription in THR.          Discharge Exercise Prescription (Final Exercise Prescription Changes): Exercise Prescription Changes - 10/03/19 1100      Response to Exercise   Blood Pressure (Admit)  120/68    Blood Pressure (Exercise)  148/54    Blood Pressure (Exit)  102/58    Heart Rate (Admit)  79 bpm    Heart Rate (Exercise)  105 bpm    Heart Rate (Exit)  73 bpm    Rating of Perceived Exertion (Exercise)  12    Symptoms  none      Resistance Training   Training Prescription  Yes    Weight  4 lb    Reps  10-15      Treadmill  MPH  2.7    Grade  1    Minutes  15    METs  3.44      NuStep   Level  4    SPM  80    Minutes  15    METs  2.8       Nutrition:  Target Goals: Understanding of nutrition guidelines, daily intake of sodium '1500mg'$ , cholesterol '200mg'$ , calories 30% from fat and 7% or less from saturated fats, daily to have 5 or more servings of fruits and vegetables.  Biometrics: Pre Biometrics - 09/23/19 1627      Pre Biometrics   Height  6' 0.1" (1.831 m)    Weight  218 lb 6.4 oz (99.1 kg)    BMI (Calculated)  29.55    Single Leg Stand  30 seconds         Nutrition Therapy Plan and Nutrition Goals:   Nutrition Assessments: Nutrition Assessments - 09/18/19 1150      MEDFICTS Scores   Pre Score  30       Nutrition Goals Re-Evaluation:   Nutrition Goals Discharge (Final Nutrition Goals Re-Evaluation):   Psychosocial: Target Goals: Acknowledge presence or absence of significant depression and/or stress, maximize coping skills, provide positive support system. Participant is able to verbalize types and ability to use techniques and skills needed for reducing stress and depression.   Initial Review & Psychosocial Screening: Initial Psych Review & Screening - 09/18/19 1113      Initial Review   Current issues with  Current Sleep Concerns;Current Stress Concerns   takes ambien but gets at least 6 hrs each night   Source of Stress Concerns  Occupation    Comments  Work is his major stressor. 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      Family Dynamics   Good Support System?  Yes   family, sister, brother, son, girlfrend, friends     Barriers   Psychosocial barriers to participate in program  The patient should benefit from training in stress management and relaxation.;Psychosocial barriers identified (see note)      Screening Interventions   Interventions  Encouraged to exercise;To provide support and resources with identified psychosocial needs;Provide feedback about the scores to participant    Expected Outcomes  Short Term goal: Utilizing psychosocial counselor, staff and physician to assist with identification of specific Stressors or current issues interfering with healing process. Setting desired goal for each stressor or current issue identified.;Long Term Goal: Stressors or current issues are controlled or eliminated.;Short Term goal: Identification and review with participant of any Quality of Life or Depression concerns found by scoring the questionnaire.;Long Term goal: The participant  improves quality of Life and PHQ9 Scores as seen by post scores and/or verbalization of changes       Quality of Life Scores:  Quality of Life - 09/18/19 1150      Quality of Life   Select  Quality of Life      Quality of Life Scores   Health/Function Pre  23.73 %    Socioeconomic Pre  24.5 %    Psych/Spiritual Pre  27.85 %    Family Pre  27.6 %    GLOBAL Pre  25.28 %      Scores of 19 and below usually indicate a poorer quality of life in these areas.  A difference of  2-3 points is a clinically meaningful difference.  A difference of 2-3 points  in the total score of the Quality of Life Index has been associated with significant improvement in overall quality of life, self-image, physical symptoms, and general health in studies assessing change in quality of life.  PHQ-9: Recent Review Flowsheet Data    Depression screen  [facility] 2/9 09/23/2019   Decreased Interest 0   Down, Depressed, Hopeless 0   PHQ - 2 Score 0   Altered sleeping 0   Tired, decreased energy 0   Change in appetite 0   Feeling bad or failure about yourself  0   Trouble concentrating 0   Moving slowly or fidgety/restless 0   Suicidal thoughts 0   PHQ-9 Score 0   Difficult doing work/chores Not difficult at all     Interpretation of Total Score  Total Score Depression Severity:  1-4 = Minimal depression, 5-9 = Mild depression, 10-14 = Moderate depression, 15-19 = Moderately severe depression, 20-27 = Severe depression   Psychosocial Evaluation and Intervention: Psychosocial Evaluation - 09/18/19 1106      Psychosocial Evaluation & Interventions   Comments  He has an autoimmune disease and has been on prednisone since February with taper now until 12/7.  Currently also working on quitting smoking. He is back to work already and has a stressful job.       Psychosocial Re-Evaluation:   Psychosocial Discharge (Final Psychosocial Re-Evaluation):   Vocational Rehabilitation: Provide vocational rehab  assistance to qualifying candidates.   Vocational Rehab Evaluation & Intervention: Vocational Rehab - 09/18/19 1116      Initial Vocational Rehab Evaluation & Intervention   Assessment shows need for Vocational Rehabilitation  No       Education: Education Goals: Education classes will be provided on a variety of topics geared toward better understanding of heart health and risk factor modification. Participant will state understanding/return demonstration of topics presented as noted by education test scores.  Learning Barriers/Preferences: Learning Barriers/Preferences - 09/18/19 1116      Learning Barriers/Preferences   Learning Barriers  Sight   reading glasses   Learning Preferences  None       Education Topics:  AED/CPR: - Group verbal and written instruction with the use of models to demonstrate the basic use of the AED with the basic ABC's of resuscitation.   General Nutrition Guidelines/Fats and Fiber: -Group instruction provided by verbal, written material, models and posters to present the general guidelines for heart healthy nutrition. Gives an explanation and review of dietary fats and fiber.   Controlling Sodium/Reading Food Labels: -Group verbal and written material supporting the discussion of sodium use in heart healthy nutrition. Review and explanation with models, verbal and written materials for utilization of the food label.   Exercise Physiology & General Exercise Guidelines: - Group verbal and written instruction with models to review the exercise physiology of the cardiovascular system and associated critical values. Provides general exercise guidelines with specific guidelines to those with heart or lung disease.    Aerobic Exercise & Resistance Training: - Gives group verbal and written instruction on the various components of exercise. Focuses on aerobic and resistive training programs and the benefits of this training and how to safely progress  through these programs..   Flexibility, Balance, Mind/Body Relaxation: Provides group verbal/written instruction on the benefits of flexibility and balance training, including mind/body exercise modes such as yoga, pilates and tai chi.  Demonstration and skill practice provided.   Stress and Anxiety: - Provides group verbal and written instruction about the health risks of elevated  stress and causes of high stress.  Discuss the correlation between heart/lung disease and anxiety and treatment options. Review healthy ways to manage with stress and anxiety.   Depression: - Provides group verbal and written instruction on the correlation between heart/lung disease and depressed mood, treatment options, and the stigmas associated with seeking treatment.   Anatomy & Physiology of the Heart: - Group verbal and written instruction and models provide basic cardiac anatomy and physiology, with the coronary electrical and arterial systems. Review of Valvular disease and Heart Failure   Cardiac Procedures: - Group verbal and written instruction to review commonly prescribed medications for heart disease. Reviews the medication, class of the drug, and side effects. Includes the steps to properly store meds and maintain the prescription regimen. (beta blockers and nitrates)   Cardiac Medications I: - Group verbal and written instruction to review commonly prescribed medications for heart disease. Reviews the medication, class of the drug, and side effects. Includes the steps to properly store meds and maintain the prescription regimen.   Cardiac Medications II: -Group verbal and written instruction to review commonly prescribed medications for heart disease. Reviews the medication, class of the drug, and side effects. (all other drug classes)    Go Sex-Intimacy & Heart Disease, Get SMART - Goal Setting: - Group verbal and written instruction through game format to discuss heart disease and the  return to sexual intimacy. Provides group verbal and written material to discuss and apply goal setting through the application of the S.M.A.R.T. Method.   Other Matters of the Heart: - Provides group verbal, written materials and models to describe Stable Angina and Peripheral Artery. Includes description of the disease process and treatment options available to the cardiac patient.   Exercise & Equipment Safety: - Individual verbal instruction and demonstration of equipment use and safety with use of the equipment.   Cardiac Rehab from 09/23/2019 in [facility] Cardiac and Pulmonary Rehab  Date  09/23/19  Educator  [facility]  Instruction Review Code  1- Verbalizes Understanding      Infection Prevention: - Provides verbal and written material to individual with discussion of infection control including proper hand washing and proper equipment cleaning during exercise session.   Cardiac Rehab from 09/23/2019 in [facility] Cardiac and Pulmonary Rehab  Date  09/23/19  Educator  [facility]  Instruction Review Code  1- Verbalizes Understanding      Falls Prevention: - Provides verbal and written material to individual with discussion of falls prevention and safety.   Cardiac Rehab from 09/23/2019 in [facility] Cardiac and Pulmonary Rehab  Date  09/23/19  Educator  [facility]  Instruction Review Code  1- Verbalizes Understanding      Diabetes: - Individual verbal and written instruction to review signs/symptoms of diabetes, desired ranges of glucose level fasting, after meals and with exercise. Acknowledge that pre and post exercise glucose checks will be done for 3 sessions at entry of program.   Know Your Numbers and Risk Factors: -Group verbal and written instruction about important numbers in your health.  Discussion of what are risk factors and how they play a role in the disease process.  Review of Cholesterol, Blood Pressure, Diabetes, and BMI and the role they play in your overall health.   Sleep  Hygiene: -Provides group verbal and written instruction about how sleep can affect your health.  Define sleep hygiene, discuss sleep cycles and impact of sleep habits. Review good sleep hygiene tips.    Other: -Provides group and verbal instruction on  various topics (see comments)   Knowledge Questionnaire Score: Knowledge Questionnaire Score - 09/18/19 1151      Knowledge Questionnaire Score   Pre Score  25/26 Education Focus: Angina       Core Components/Risk Factors/Patient Goals at Admission: Personal Goals and Risk Factors at Admission - 09/23/19 1627      Core Components/Risk Factors/Patient Goals on Admission    Weight Management  Yes;Weight Loss    Intervention  Weight Management: Develop a combined nutrition and exercise program designed to reach desired caloric intake, while maintaining appropriate intake of nutrient and fiber, sodium and fats, and appropriate energy expenditure required for the weight goal.;Weight Management: Provide education and appropriate resources to help participant work on and attain dietary goals.    Admit Weight  218 lb 6.4 oz (99.1 kg)    Goal Weight: Short Term  213 lb (96.6 kg)    Goal Weight: Long Term  200 lb (90.7 kg)    Expected Outcomes  Short Term: Continue to assess and modify interventions until short term weight is achieved;Long Term: Adherence to nutrition and physical activity/exercise program aimed toward attainment of established weight goal;Weight Loss: Understanding of general recommendations for a balanced deficit meal plan, which promotes 1-2 lb weight loss per week and includes a negative energy balance of [phone] kcal/d;Understanding recommendations for meals to include 15-35% energy as protein, 25-35% energy from fat, 35-60% energy from carbohydrates, less than '200mg'$  of dietary cholesterol, 20-35 gm of total fiber daily;Understanding of distribution of calorie intake throughout the day with the consumption of 4-5 meals/snacks     Tobacco Cessation  Yes    Number of packs per day  0.5    Intervention  Assist the participant in steps to quit. Provide individualized education and counseling about committing to Tobacco Cessation, relapse prevention, and pharmacological support that can be provided by physician.;Advice worker, assist with locating and accessing local/national Quit Smoking programs, and support quit date choice.    Expected Outcomes  Short Term: Will quit all tobacco product use, adhering to prevention of relapse plan.;Short Term: Will demonstrate readiness to quit, by selecting a quit date.;Long Term: Complete abstinence from all tobacco products for at least 12 months from quit date.    Diabetes  Yes    Intervention  Provide education about signs/symptoms and action to take for hypo/hyperglycemia.;Provide education about proper nutrition, including hydration, and aerobic/resistive exercise prescription along with prescribed medications to achieve blood glucose in normal ranges: Fasting glucose 65-99 mg/dL    Expected Outcomes  Short Term: Participant verbalizes understanding of the signs/symptoms and immediate care of hyper/hypoglycemia, proper foot care and importance of medication, aerobic/resistive exercise and nutrition plan for blood glucose control.;Long Term: Attainment of HbA1C < 7%.    Hypertension  Yes    Intervention  Provide education on lifestyle modifcations including regular physical activity/exercise, weight management, moderate sodium restriction and increased consumption of fresh fruit, vegetables, and low fat dairy, alcohol moderation, and smoking cessation.;Monitor prescription use compliance.    Expected Outcomes  Short Term: Continued assessment and intervention until BP is < 140/6m HG in hypertensive participants. < 130/873mHG in hypertensive participants with diabetes, heart failure or chronic kidney disease.;Long Term: Maintenance of blood pressure at goal levels.    Lipids   Yes    Intervention  Provide education and support for participant on nutrition & aerobic/resistive exercise along with prescribed medications to achieve LDL '70mg'$ , HDL >'40mg'$ .    Expected Outcomes  Short Term: Participant states  understanding of desired cholesterol values and is compliant with medications prescribed. Participant is following exercise prescription and nutrition guidelines.;Long Term: Cholesterol controlled with medications as prescribed, with individualized exercise RX and with personalized nutrition plan. Value goals: LDL < '70mg'$ , HDL > 40 mg.       Core Components/Risk Factors/Patient Goals Review:    Core Components/Risk Factors/Patient Goals at Discharge (Final Review):    ITP Comments: ITP Comments    Row Name 09/18/19 1125 09/23/19 1623 09/30/19 1653 10/16/19 0915     ITP Comments  Completed virtual orientation today.  Documentation for diagnosis can be found in [facility] encounter 10/12.  EP eval scheduled for 11/9.  Completed 6MWT and gym orientation.  Initial ITP created and sent for review to Dr. Emily Filbert, Medical Director.  First full day of exercise!  Patient was oriented to gym and equipment including functions, settings, policies, and procedures.  Patient's individual exercise prescription and treatment plan were reviewed.  All starting workloads were established based on the results of the 6 minute walk test done at initial orientation visit.  The plan for exercise progression was also introduced and progression will be customized based on patient's performance and goals  30 day review competed . ITP sent to Dr Emily Filbert for review, changes as needed and ITP approval signature.       Comments:

## 2019-10-21 ENCOUNTER — Other Ambulatory Visit: Payer: Self-pay

## 2019-10-21 ENCOUNTER — Encounter: Payer: 59 | Attending: Cardiology | Admitting: *Deleted

## 2019-10-21 DIAGNOSIS — Z955 Presence of coronary angioplasty implant and graft: Secondary | ICD-10-CM | POA: Insufficient documentation

## 2019-10-21 NOTE — Progress Notes (Signed)
Completed Initial RD Evaluation 

## 2019-10-21 NOTE — Progress Notes (Signed)
Daily Session Note  Patient Details  Name: Hector Garcia MRN: [id number] Date of Birth: 05-30-1956 Referring Provider:     Cardiac Rehab from 09/23/2019 in [facility] Cardiac and Pulmonary Rehab  Referring Provider  Isaias Cowman MD      Encounter Date: 10/21/2019  Check In: Session Check In - 10/21/19 1653      Check-In   Supervising physician immediately available to respond to emergencies  See telemetry face sheet for immediately available ER MD    Location  [facility]-Cardiac & Pulmonary Rehab    Staff Present  Renita Papa, RN Moises Blood, BS, ACSM CEP, Exercise Physiologist;Joseph Tessie Fass RCP,RRT,BSRT    Virtual Visit  No    Medication changes reported      No    Fall or balance concerns reported     No    Warm-up and Cool-down  Performed on first and last piece of equipment    Resistance Training Performed  Yes    VAD Patient?  No    PAD/SET Patient?  No      Pain Assessment   Currently in Pain?  No/denies          Social History   Tobacco Use  Smoking Status Current Some Day Smoker  . Packs/day: 0.50  . Last attempt to quit: 11/2016  . Years since quitting: 2.9  Smokeless Tobacco Never Used  Tobacco Comment   may smoke 1 pack a week    Goals Met:  Independence with exercise equipment Exercise tolerated well No report of cardiac concerns or symptoms Strength training completed today  Goals Unmet:  Not Applicable  Comments: Pt able to follow exercise prescription today without complaint.  Will continue to monitor for progression.    Dr. Emily Filbert is Medical Director for [place] and [facility].

## 2019-10-23 ENCOUNTER — Other Ambulatory Visit: Payer: Self-pay

## 2019-10-23 ENCOUNTER — Encounter: Payer: 59 | Admitting: *Deleted

## 2019-10-23 DIAGNOSIS — Z955 Presence of coronary angioplasty implant and graft: Secondary | ICD-10-CM

## 2019-10-23 NOTE — Progress Notes (Signed)
Daily Session Note  Patient Details  Name: Hector Garcia MRN: [id number] Date of Birth: February 08, 1956 Referring Provider:     Cardiac Rehab from 09/23/2019 in [facility] Cardiac and Pulmonary Rehab  Referring Provider  Isaias Cowman MD      Encounter Date: 10/23/2019  Check In: Session Check In - 10/23/19 1645      Check-In   Supervising physician immediately available to respond to emergencies  See telemetry face sheet for immediately available ER MD    Location  [facility]-Cardiac & Pulmonary Rehab    Staff Present  Renita Papa, RN Vickki Hearing, BA, ACSM CEP, Exercise Physiologist;Melissa Caiola RDN, LDN    Virtual Visit  No    Medication changes reported      No    Fall or balance concerns reported     No    Tobacco Cessation  No Change    Warm-up and Cool-down  Performed on first and last piece of equipment    Resistance Training Performed  Yes    VAD Patient?  No    PAD/SET Patient?  No      Pain Assessment   Currently in Pain?  No/denies          Social History   Tobacco Use  Smoking Status Current Some Day Smoker  . Packs/day: 0.50  . Last attempt to quit: 11/2016  . Years since quitting: 2.9  Smokeless Tobacco Never Used  Tobacco Comment   may smoke 1 pack a week    Goals Met:  Independence with exercise equipment Exercise tolerated well No report of cardiac concerns or symptoms Strength training completed today  Goals Unmet:  Not Applicable  Comments: Pt able to follow exercise prescription today without complaint.  Will continue to monitor for progression. Reviewed home exercise with pt today.  Pt plans to use his [employer] treadmill and elliptical at home for exercise.  Reviewed THR, pulse, RPE, sign and symptoms, NTG use, and when to call 911 or MD.  Also discussed weather considerations and indoor options.  Pt voiced understanding.    Dr. Emily Filbert is Medical Director for [facility] and [facility].

## 2019-11-04 ENCOUNTER — Encounter: Payer: 59 | Admitting: *Deleted

## 2019-11-04 ENCOUNTER — Telehealth: Payer: Self-pay

## 2019-11-04 ENCOUNTER — Other Ambulatory Visit: Payer: Self-pay

## 2019-11-04 VITALS — Ht 72.1 in | Wt 218.0 lb

## 2019-11-04 DIAGNOSIS — Z955 Presence of coronary angioplasty implant and graft: Secondary | ICD-10-CM | POA: Diagnosis not present

## 2019-11-04 NOTE — Telephone Encounter (Signed)
Hector Garcia plans to attend tonight and will do 6 MWT and paperwork to graduate

## 2019-11-04 NOTE — Progress Notes (Signed)
Discharge Progress Report  Patient Details  Name: Hector Garcia MRN: [id number] Date of Birth: 28-Sep-1956 Referring Provider:     Cardiac Rehab from 09/23/2019 in [facility] Cardiac and Pulmonary Rehab  Referring Provider  Isaias Cowman MD       Number of Visits: 9  Reason for Discharge:  Early Exit:  Insurance  Smoking History:  Social History   Tobacco Use  Smoking Status Current Some Day Smoker  . Packs/day: 0.50  . Last attempt to quit: 11/2016  . Years since quitting: 2.9  Smokeless Tobacco Never Used  Tobacco Comment   may smoke 1 pack a week    Diagnosis:  Status post coronary artery stent placement  ADL UCSD:   Initial Exercise Prescription: Initial Exercise Prescription - 09/23/19 1600      Date of Initial Exercise RX and Referring Provider   Date  09/23/19    Referring Provider  Isaias Cowman MD      Treadmill   MPH  2.7    Grade  1    Minutes  15    METs  3.44      Recumbant Bike   Level  3    RPM  50    Watts  43    Minutes  15    METs  3      NuStep   Level  3    SPM  80    Minutes  15    METs  3      Recumbant Elliptical   Level  2    RPM  50    Minutes  15    METs  3      T5 Nustep   Level  3    SPM  80    Minutes  15    METs  3      Biostep-RELP   Level  3    SPM  50    Minutes  15    METs  3      Prescription Details   Frequency (times per week)  3    Duration  Progress to 30 minutes of continuous aerobic without signs/symptoms of physical distress      Intensity   THRR 40-80% of Max Heartrate  106-144    Ratings of Perceived Exertion  11-13    Perceived Dyspnea  0-4      Progression   Progression  Continue to progress workloads to maintain intensity without signs/symptoms of physical distress.      Resistance Training   Training Prescription  Yes    Weight  4 lbs    Reps  10-15       Discharge Exercise Prescription (Final Exercise Prescription Changes): Exercise Prescription Changes -  10/29/19 1500      Response to Exercise   Blood Pressure (Admit)  140/64    Blood Pressure (Exercise)  154/60    Blood Pressure (Exit)  128/66    Heart Rate (Admit)  61 bpm    Heart Rate (Exercise)  81 bpm    Heart Rate (Exit)  72 bpm    Rating of Perceived Exertion (Exercise)  12    Symptoms  none    Duration  Continue with 30 min of aerobic exercise without signs/symptoms of physical distress.    Intensity  THRR unchanged      Progression   Progression  Continue to progress workloads to maintain intensity without signs/symptoms of physical distress.    Average METs  3      Resistance Training   Training Prescription  Yes    Weight  4 lb    Reps  10-15      Interval Training   Interval Training  No      Treadmill   MPH  2.7    Grade  1    Minutes  15    METs  3.44      NuStep   Level  4    SPM  80    Minutes  15    METs  2.6      Home Exercise Plan   Plans to continue exercise at  Home (comment)   treadmill and elliptical   Frequency  Add 2 additional days to program exercise sessions.    Initial Home Exercises Provided  10/23/19       Functional Capacity: 6 Minute Walk    Row Name 09/23/19 1623 11/04/19 1709       6 Minute Walk   Phase  Initial  Discharge    Distance  1430 feet  1700 feet    Distance % Change  --  18.8 %    Distance Feet Change  --  270 ft    Walk Time  6 minutes  6 minutes    # of Rest Breaks  0  0    MPH  2.71  3.22    METS  3.36  4.6    RPE  7  12    VO2 Peak  11.78  --    Symptoms  No  --    Resting HR  71 bpm  65 bpm    Resting BP  126/70  138/64    Resting Oxygen Saturation   98 %  --    Exercise Oxygen Saturation  during 6 min walk  99 %  --    Max Ex. HR  74 bpm  137 bpm    Max Ex. BP  142/74  150/54    2 Minute Post BP  120/70  --       Psychological, QOL, Others - Outcomes: PHQ 2/9: Depression screen [facility] 2/9 11/04/2019 09/23/2019  Decreased Interest 0 0  Down, Depressed, Hopeless 0 0  PHQ - 2 Score 0 0  Altered  sleeping 0 0  Tired, decreased energy 0 0  Change in appetite 1 0  Feeling bad or failure about yourself  0 0  Trouble concentrating 0 0  Moving slowly or fidgety/restless 0 0  Suicidal thoughts 0 0  PHQ-9 Score 1 0  Difficult doing work/chores - Not difficult at all    Quality of Life: Quality of Life - 11/04/19 1720      Quality of Life   Select  Quality of Life      Quality of Life Scores   Health/Function Post  23.4 %    Socioeconomic Post  25 %    Psych/Spiritual Post  27.14 %    Family Post  27.5 %    GLOBAL Post  25.1 %       Personal Goals: Goals established at orientation with interventions provided to work toward goal. Personal Goals and Risk Factors at Admission - 09/23/19 1627      Core Components/Risk Factors/Patient Goals on Admission    Weight Management  Yes;Weight Loss    Intervention  Weight Management: Develop a combined nutrition and exercise program designed to reach desired caloric intake, while maintaining appropriate intake of nutrient and  fiber, sodium and fats, and appropriate energy expenditure required for the weight goal.;Weight Management: Provide education and appropriate resources to help participant work on and attain dietary goals.    Admit Weight  218 lb 6.4 oz (99.1 kg)    Goal Weight: Short Term  213 lb (96.6 kg)    Goal Weight: Long Term  200 lb (90.7 kg)    Expected Outcomes  Short Term: Continue to assess and modify interventions until short term weight is achieved;Long Term: Adherence to nutrition and physical activity/exercise program aimed toward attainment of established weight goal;Weight Loss: Understanding of general recommendations for a balanced deficit meal plan, which promotes 1-2 lb weight loss per week and includes a negative energy balance of [phone] kcal/d;Understanding recommendations for meals to include 15-35% energy as protein, 25-35% energy from fat, 35-60% energy from carbohydrates, less than 200mg  of dietary  cholesterol, 20-35 gm of total fiber daily;Understanding of distribution of calorie intake throughout the day with the consumption of 4-5 meals/snacks    Tobacco Cessation  Yes    Number of packs per day  0.5    Intervention  Assist the participant in steps to quit. Provide individualized education and counseling about committing to Tobacco Cessation, relapse prevention, and pharmacological support that can be provided by physician.;Advice worker, assist with locating and accessing local/national Quit Smoking programs, and support quit date choice.    Expected Outcomes  Short Term: Will quit all tobacco product use, adhering to prevention of relapse plan.;Short Term: Will demonstrate readiness to quit, by selecting a quit date.;Long Term: Complete abstinence from all tobacco products for at least 12 months from quit date.    Diabetes  Yes    Intervention  Provide education about signs/symptoms and action to take for hypo/hyperglycemia.;Provide education about proper nutrition, including hydration, and aerobic/resistive exercise prescription along with prescribed medications to achieve blood glucose in normal ranges: Fasting glucose 65-99 mg/dL    Expected Outcomes  Short Term: Participant verbalizes understanding of the signs/symptoms and immediate care of hyper/hypoglycemia, proper foot care and importance of medication, aerobic/resistive exercise and nutrition plan for blood glucose control.;Long Term: Attainment of HbA1C < 7%.    Hypertension  Yes    Intervention  Provide education on lifestyle modifcations including regular physical activity/exercise, weight management, moderate sodium restriction and increased consumption of fresh fruit, vegetables, and low fat dairy, alcohol moderation, and smoking cessation.;Monitor prescription use compliance.    Expected Outcomes  Short Term: Continued assessment and intervention until BP is < 140/57mm HG in hypertensive participants. < 130/70mm HG  in hypertensive participants with diabetes, heart failure or chronic kidney disease.;Long Term: Maintenance of blood pressure at goal levels.    Lipids  Yes    Intervention  Provide education and support for participant on nutrition & aerobic/resistive exercise along with prescribed medications to achieve LDL 70mg , HDL >40mg .    Expected Outcomes  Short Term: Participant states understanding of desired cholesterol values and is compliant with medications prescribed. Participant is following exercise prescription and nutrition guidelines.;Long Term: Cholesterol controlled with medications as prescribed, with individualized exercise RX and with personalized nutrition plan. Value goals: LDL < 70mg , HDL > 40 mg.        Personal Goals Discharge:   Exercise Goals and Review: Exercise Goals    Row Name 09/23/19 1626             Exercise Goals   Increase Physical Activity  Yes       Intervention  Provide advice,  education, support and counseling about physical activity/exercise needs.;Develop an individualized exercise prescription for aerobic and resistive training based on initial evaluation findings, risk stratification, comorbidities and participant's personal goals.       Expected Outcomes  Short Term: Attend rehab on a regular basis to increase amount of physical activity.;Long Term: Add in home exercise to make exercise part of routine and to increase amount of physical activity.;Long Term: Exercising regularly at least 3-5 days a week.       Increase Strength and Stamina  Yes       Intervention  Provide advice, education, support and counseling about physical activity/exercise needs.;Develop an individualized exercise prescription for aerobic and resistive training based on initial evaluation findings, risk stratification, comorbidities and participant's personal goals.       Expected Outcomes  Short Term: Increase workloads from initial exercise prescription for resistance, speed, and  METs.;Short Term: Perform resistance training exercises routinely during rehab and add in resistance training at home;Long Term: Improve cardiorespiratory fitness, muscular endurance and strength as measured by increased METs and functional capacity (6MWT)       Able to understand and use rate of perceived exertion (RPE) scale  Yes       Intervention  Provide education and explanation on how to use RPE scale       Expected Outcomes  Short Term: Able to use RPE daily in rehab to express subjective intensity level;Long Term:  Able to use RPE to guide intensity level when exercising independently       Knowledge and understanding of Target Heart Rate Range (THRR)  Yes       Intervention  Provide education and explanation of THRR including how the numbers were predicted and where they are located for reference       Expected Outcomes  Short Term: Able to state/look up THRR;Short Term: Able to use daily as guideline for intensity in rehab;Long Term: Able to use THRR to govern intensity when exercising independently       Able to check pulse independently  Yes       Intervention  Provide education and demonstration on how to check pulse in carotid and radial arteries.;Review the importance of being able to check your own pulse for safety during independent exercise       Expected Outcomes  Long Term: Able to check pulse independently and accurately;Short Term: Able to explain why pulse checking is important during independent exercise       Understanding of Exercise Prescription  Yes       Intervention  Provide education, explanation, and written materials on patient's individual exercise prescription       Expected Outcomes  Long Term: Able to explain home exercise prescription to exercise independently;Short Term: Able to explain program exercise prescription          Exercise Goals Re-Evaluation: Exercise Goals Re-Evaluation    [place] Name 09/30/19 1653 10/16/19 0943 10/29/19 1518         Exercise  Goal Re-Evaluation   Exercise Goals Review  Increase Physical Activity;Able to understand and use rate of perceived exertion (RPE) scale;Knowledge and understanding of Target Heart Rate Range (THRR);Understanding of Exercise Prescription;Increase Strength and Stamina;Able to check pulse independently  Increase Physical Activity;Increase Strength and Stamina;Understanding of Exercise Prescription  Increase Physical Activity;Increase Strength and Stamina;Able to understand and use rate of perceived exertion (RPE) scale;Knowledge and understanding of Target Heart Rate Range (THRR);Able to check pulse independently;Understanding of Exercise Prescription     Comments  Reviewed RPE scale, THR and program prescription with pt today.  Pt voiced understanding and was given a copy of goals to take home.  Yvone Neu is off to a good start in rehab.  He is already up to 33 watts on the recumbent bike!  We will continue to monitor his progress.  Yvone Neu has only attended twice in December.  He wants to graduate early due to insurance reasons.     Expected Outcomes  Short: Use RPE daily to regulate intensity. Long: Follow program prescription in THR.  Short: Review home exercise guidelines.  Long: Continue to rebuild strength and stamina.  Short - complete as many sessions as possible before end of year Long - maintain exercise on his own        Nutrition & Weight - Outcomes: Pre Biometrics - 09/23/19 1627      Pre Biometrics   Height  6' 0.1" (1.831 m)    Weight  218 lb 6.4 oz (99.1 kg)    BMI (Calculated)  29.55    Single Leg Stand  30 seconds      Post Biometrics - 11/04/19 1711       Post  Biometrics   Height  6' 0.1" (1.831 m)    Weight  218 lb (98.9 kg)    BMI (Calculated)  29.5    Single Leg Stand  30 seconds       Nutrition: Nutrition Therapy & Goals - 10/21/19 1001      Nutrition Therapy   Diet  Low Na, HH, DM diet    Drug/Food Interactions  Statins/Certain Fruits    Protein (specify units)  80g     Fiber  30 grams    Whole Grain Foods  3 servings    Saturated Fats  12 max. grams    Fruits and Vegetables  5 servings/day    Sodium  1.5 grams      Personal Nutrition Goals   Nutrition Goal  ST: LT: Graduate rehab in December, quit smoking (w/o flare ups of autoimmune disease), increasing exercising    Comments  Smoking ~7-10 cig/day. On Metformin (A1c ~7), has been on steroids since February. Just ended steroid yesterday. Son helping pt with diet. (85/15 rule). B: slimfast (in rush) or bagel thin w/ cream cheese and orange juice. L: [place] or ham sanwich on wheat with cheese, mayo and mustard. D: Baked chicken (brush with butter with seasonings), with vegetables and brown rice (salads or frozen vegetables). Pt also eats more salmon esp when out to eat. Drinks water and cut back on diet coke (1 can). S: pistachios. Pt reports not feeling hungry during the day, but gets hungry at 7pm. Growling in stomach or headache. will have a banana before working out, suggested adding peanut butter to add satiety. Discussed SMART goals, HH/DM eating, hunger.      Intervention Plan   Intervention  Prescribe, educate and counsel regarding individualized specific dietary modifications aiming towards targeted core components such as weight, hypertension, lipid management, diabetes, heart failure and other comorbidities.;Nutrition handout(s) given to patient.    Expected Outcomes  Short Term Goal: Understand basic principles of dietary content, such as calories, fat, sodium, cholesterol and nutrients.;Short Term Goal: A plan has been developed with personal nutrition goals set during dietitian appointment.;Long Term Goal: Adherence to prescribed nutrition plan.       Nutrition Discharge: Nutrition Assessments - 11/04/19 1720      MEDFICTS Scores   Post Score  15  Education Questionnaire Score: Knowledge Questionnaire Score - 11/04/19 1720      Knowledge Questionnaire Score   Post Score  25/26        Goals reviewed with patient; copy given to patient.

## 2019-11-04 NOTE — Progress Notes (Signed)
Daily Session Note  Patient Details  Name: Hector Garcia MRN: [id number] Date of Birth: 1956-01-10 Referring Provider:     Cardiac Rehab from 09/23/2019 in [facility] Cardiac and Pulmonary Rehab  Referring Provider  Isaias Cowman MD      Encounter Date: 11/04/2019  Check In: Session Check In - 11/04/19 1656      Check-In   Supervising physician immediately available to respond to emergencies  See telemetry face sheet for immediately available ER MD    Location  [facility]-Cardiac & Pulmonary Rehab    Staff Present  Renita Papa, RN Moises Blood, BS, ACSM CEP, Exercise Physiologist;Joseph Tessie Fass RCP,RRT,BSRT    Virtual Visit  No    Medication changes reported      No    Fall or balance concerns reported     No    Warm-up and Cool-down  Performed on first and last piece of equipment    Resistance Training Performed  Yes    VAD Patient?  No    PAD/SET Patient?  No      Pain Assessment   Currently in Pain?  No/denies          Social History   Tobacco Use  Smoking Status Current Some Day Smoker  . Packs/day: 0.50  . Last attempt to quit: 11/2016  . Years since quitting: 2.9  Smokeless Tobacco Never Used  Tobacco Comment   may smoke 1 pack a week    Goals Met:  Independence with exercise equipment Exercise tolerated well No report of cardiac concerns or symptoms Strength training completed today  Goals Unmet:  Not Applicable  Comments:  Hector Garcia graduated today from  rehab with 9 sessions completed.  Details of the patient's exercise prescription and what He needs to do in order to continue the prescription and progress were discussed with patient.  Patient was given a copy of prescription and goals.  Patient verbalized understanding.  Jeremaine plans to continue to exercise by walking at home.     Dr. Emily Filbert is Medical Director for [place] and [facility].

## 2019-11-04 NOTE — Patient Instructions (Signed)
Discharge Patient Instructions  Patient Details  Name: Hector Garcia MRN: [id number] Date of Birth: 02-07-1956 Referring Provider:  Isaias Cowman, MD   Number of Visits: 9 Reason for Discharge:  Early Exit:  Insurance  Smoking History:  Social History   Tobacco Use  Smoking Status Current Some Day Smoker  . Packs/day: 0.50  . Last attempt to quit: 11/2016  . Years since quitting: 2.9  Smokeless Tobacco Never Used  Tobacco Comment   may smoke 1 pack a week    Diagnosis:  Status post coronary artery stent placement  Initial Exercise Prescription: Initial Exercise Prescription - 09/23/19 1600      Date of Initial Exercise RX and Referring Provider   Date  09/23/19    Referring Provider  Isaias Cowman MD      Treadmill   MPH  2.7    Grade  1    Minutes  15    METs  3.44      Recumbant Bike   Level  3    RPM  50    Watts  43    Minutes  15    METs  3      NuStep   Level  3    SPM  80    Minutes  15    METs  3      Recumbant Elliptical   Level  2    RPM  50    Minutes  15    METs  3      T5 Nustep   Level  3    SPM  80    Minutes  15    METs  3      Biostep-RELP   Level  3    SPM  50    Minutes  15    METs  3      Prescription Details   Frequency (times per week)  3    Duration  Progress to 30 minutes of continuous aerobic without signs/symptoms of physical distress      Intensity   THRR 40-80% of Max Heartrate  106-144    Ratings of Perceived Exertion  11-13    Perceived Dyspnea  0-4      Progression   Progression  Continue to progress workloads to maintain intensity without signs/symptoms of physical distress.      Resistance Training   Training Prescription  Yes    Weight  4 lbs    Reps  10-15       Discharge Exercise Prescription (Final Exercise Prescription Changes): Exercise Prescription Changes - 10/29/19 1500      Response to Exercise   Blood Pressure (Admit)  140/64    Blood Pressure (Exercise)  154/60     Blood Pressure (Exit)  128/66    Heart Rate (Admit)  61 bpm    Heart Rate (Exercise)  81 bpm    Heart Rate (Exit)  72 bpm    Rating of Perceived Exertion (Exercise)  12    Symptoms  none    Duration  Continue with 30 min of aerobic exercise without signs/symptoms of physical distress.    Intensity  THRR unchanged      Progression   Progression  Continue to progress workloads to maintain intensity without signs/symptoms of physical distress.    Average METs  3      Resistance Training   Training Prescription  Yes    Weight  4 lb    Reps  10-15      Interval Training   Interval Training  No      Treadmill   MPH  2.7    Grade  1    Minutes  15    METs  3.44      NuStep   Level  4    SPM  80    Minutes  15    METs  2.6      Home Exercise Plan   Plans to continue exercise at  Home (comment)   treadmill and elliptical   Frequency  Add 2 additional days to program exercise sessions.    Initial Home Exercises Provided  10/23/19       Functional Capacity: 6 Minute Walk    Row Name 09/23/19 1623 11/04/19 1709       6 Minute Walk   Phase  Initial  Discharge    Distance  1430 feet  1700 feet    Distance % Change  --  18.8 %    Distance Feet Change  --  270 ft    Walk Time  6 minutes  6 minutes    # of Rest Breaks  0  0    MPH  2.71  3.22    METS  3.36  4.6    RPE  7  12    VO2 Peak  11.78  --    Symptoms  No  --    Resting HR  71 bpm  65 bpm    Resting BP  126/70  138/64    Resting Oxygen Saturation   98 %  --    Exercise Oxygen Saturation  during 6 min walk  99 %  --    Max Ex. HR  74 bpm  137 bpm    Max Ex. BP  142/74  150/54    2 Minute Post BP  120/70  --       Quality of Life: Quality of Life - 11/04/19 1720      Quality of Life   Select  Quality of Life      Quality of Life Scores   Health/Function Post  23.4 %    Socioeconomic Post  25 %    Psych/Spiritual Post  27.14 %    Family Post  27.5 %    GLOBAL Post  25.1 %       Personal  Goals: Goals established at orientation with interventions provided to work toward goal. Personal Goals and Risk Factors at Admission - 09/23/19 1627      Core Components/Risk Factors/Patient Goals on Admission    Weight Management  Yes;Weight Loss    Intervention  Weight Management: Develop a combined nutrition and exercise program designed to reach desired caloric intake, while maintaining appropriate intake of nutrient and fiber, sodium and fats, and appropriate energy expenditure required for the weight goal.;Weight Management: Provide education and appropriate resources to help participant work on and attain dietary goals.    Admit Weight  218 lb 6.4 oz (99.1 kg)    Goal Weight: Short Term  213 lb (96.6 kg)    Goal Weight: Long Term  200 lb (90.7 kg)    Expected Outcomes  Short Term: Continue to assess and modify interventions until short term weight is achieved;Long Term: Adherence to nutrition and physical activity/exercise program aimed toward attainment of established weight goal;Weight Loss: Understanding of general recommendations for a balanced deficit meal plan, which promotes 1-2 lb weight loss per week  and includes a negative energy balance of [phone] kcal/d;Understanding recommendations for meals to include 15-35% energy as protein, 25-35% energy from fat, 35-60% energy from carbohydrates, less than 200mg  of dietary cholesterol, 20-35 gm of total fiber daily;Understanding of distribution of calorie intake throughout the day with the consumption of 4-5 meals/snacks    Tobacco Cessation  Yes    Number of packs per day  0.5    Intervention  Assist the participant in steps to quit. Provide individualized education and counseling about committing to Tobacco Cessation, relapse prevention, and pharmacological support that can be provided by physician.;Advice worker, assist with locating and accessing local/national Quit Smoking programs, and support quit date choice.     Expected Outcomes  Short Term: Will quit all tobacco product use, adhering to prevention of relapse plan.;Short Term: Will demonstrate readiness to quit, by selecting a quit date.;Long Term: Complete abstinence from all tobacco products for at least 12 months from quit date.    Diabetes  Yes    Intervention  Provide education about signs/symptoms and action to take for hypo/hyperglycemia.;Provide education about proper nutrition, including hydration, and aerobic/resistive exercise prescription along with prescribed medications to achieve blood glucose in normal ranges: Fasting glucose 65-99 mg/dL    Expected Outcomes  Short Term: Participant verbalizes understanding of the signs/symptoms and immediate care of hyper/hypoglycemia, proper foot care and importance of medication, aerobic/resistive exercise and nutrition plan for blood glucose control.;Long Term: Attainment of HbA1C < 7%.    Hypertension  Yes    Intervention  Provide education on lifestyle modifcations including regular physical activity/exercise, weight management, moderate sodium restriction and increased consumption of fresh fruit, vegetables, and low fat dairy, alcohol moderation, and smoking cessation.;Monitor prescription use compliance.    Expected Outcomes  Short Term: Continued assessment and intervention until BP is < 140/41mm HG in hypertensive participants. < 130/54mm HG in hypertensive participants with diabetes, heart failure or chronic kidney disease.;Long Term: Maintenance of blood pressure at goal levels.    Lipids  Yes    Intervention  Provide education and support for participant on nutrition & aerobic/resistive exercise along with prescribed medications to achieve LDL 70mg , HDL >40mg .    Expected Outcomes  Short Term: Participant states understanding of desired cholesterol values and is compliant with medications prescribed. Participant is following exercise prescription and nutrition guidelines.;Long Term: Cholesterol  controlled with medications as prescribed, with individualized exercise RX and with personalized nutrition plan. Value goals: LDL < 70mg , HDL > 40 mg.        Personal Goals Discharge:   Exercise Goals and Review: Exercise Goals    Row Name 09/23/19 1626             Exercise Goals   Increase Physical Activity  Yes       Intervention  Provide advice, education, support and counseling about physical activity/exercise needs.;Develop an individualized exercise prescription for aerobic and resistive training based on initial evaluation findings, risk stratification, comorbidities and participant's personal goals.       Expected Outcomes  Short Term: Attend rehab on a regular basis to increase amount of physical activity.;Long Term: Add in home exercise to make exercise part of routine and to increase amount of physical activity.;Long Term: Exercising regularly at least 3-5 days a week.       Increase Strength and Stamina  Yes       Intervention  Provide advice, education, support and counseling about physical activity/exercise needs.;Develop an individualized exercise prescription for aerobic and resistive training  based on initial evaluation findings, risk stratification, comorbidities and participant's personal goals.       Expected Outcomes  Short Term: Increase workloads from initial exercise prescription for resistance, speed, and METs.;Short Term: Perform resistance training exercises routinely during rehab and add in resistance training at home;Long Term: Improve cardiorespiratory fitness, muscular endurance and strength as measured by increased METs and functional capacity (6MWT)       Able to understand and use rate of perceived exertion (RPE) scale  Yes       Intervention  Provide education and explanation on how to use RPE scale       Expected Outcomes  Short Term: Able to use RPE daily in rehab to express subjective intensity level;Long Term:  Able to use RPE to guide intensity level  when exercising independently       Knowledge and understanding of Target Heart Rate Range (THRR)  Yes       Intervention  Provide education and explanation of THRR including how the numbers were predicted and where they are located for reference       Expected Outcomes  Short Term: Able to state/look up THRR;Short Term: Able to use daily as guideline for intensity in rehab;Long Term: Able to use THRR to govern intensity when exercising independently       Able to check pulse independently  Yes       Intervention  Provide education and demonstration on how to check pulse in carotid and radial arteries.;Review the importance of being able to check your own pulse for safety during independent exercise       Expected Outcomes  Long Term: Able to check pulse independently and accurately;Short Term: Able to explain why pulse checking is important during independent exercise       Understanding of Exercise Prescription  Yes       Intervention  Provide education, explanation, and written materials on patient's individual exercise prescription       Expected Outcomes  Long Term: Able to explain home exercise prescription to exercise independently;Short Term: Able to explain program exercise prescription          Exercise Goals Re-Evaluation: Exercise Goals Re-Evaluation    [place] Name 09/30/19 1653 10/16/19 0943 10/29/19 1518         Exercise Goal Re-Evaluation   Exercise Goals Review  Increase Physical Activity;Able to understand and use rate of perceived exertion (RPE) scale;Knowledge and understanding of Target Heart Rate Range (THRR);Understanding of Exercise Prescription;Increase Strength and Stamina;Able to check pulse independently  Increase Physical Activity;Increase Strength and Stamina;Understanding of Exercise Prescription  Increase Physical Activity;Increase Strength and Stamina;Able to understand and use rate of perceived exertion (RPE) scale;Knowledge and understanding of Target Heart Rate  Range (THRR);Able to check pulse independently;Understanding of Exercise Prescription     Comments  Reviewed RPE scale, THR and program prescription with pt today.  Pt voiced understanding and was given a copy of goals to take home.  Hector Garcia is off to a good start in rehab.  He is already up to 33 watts on the recumbent bike!  We will continue to monitor his progress.  Hector Garcia has only attended twice in December.  He wants to graduate early due to insurance reasons.     Expected Outcomes  Short: Use RPE daily to regulate intensity. Long: Follow program prescription in THR.  Short: Review home exercise guidelines.  Long: Continue to rebuild strength and stamina.  Short - complete as many sessions as possible before end  of year Long - maintain exercise on his own        Nutrition & Weight - Outcomes: Pre Biometrics - 09/23/19 1627      Pre Biometrics   Height  6' 0.1" (1.831 m)    Weight  218 lb 6.4 oz (99.1 kg)    BMI (Calculated)  29.55    Single Leg Stand  30 seconds      Post Biometrics - 11/04/19 1711       Post  Biometrics   Height  6' 0.1" (1.831 m)    Weight  218 lb (98.9 kg)    BMI (Calculated)  29.5    Single Leg Stand  30 seconds       Nutrition: Nutrition Therapy & Goals - 10/21/19 1001      Nutrition Therapy   Diet  Low Na, HH, DM diet    Drug/Food Interactions  Statins/Certain Fruits    Protein (specify units)  80g    Fiber  30 grams    Whole Grain Foods  3 servings    Saturated Fats  12 max. grams    Fruits and Vegetables  5 servings/day    Sodium  1.5 grams      Personal Nutrition Goals   Nutrition Goal  ST: LT: Graduate rehab in December, quit smoking (w/o flare ups of autoimmune disease), increasing exercising    Comments  Smoking ~7-10 cig/day. On Metformin (A1c ~7), has been on steroids since February. Just ended steroid yesterday. Son helping pt with diet. (85/15 rule). B: slimfast (in rush) or bagel thin w/ cream cheese and orange juice. L: [place] or ham sanwich  on wheat with cheese, mayo and mustard. D: Baked chicken (brush with butter with seasonings), with vegetables and brown rice (salads or frozen vegetables). Pt also eats more salmon esp when out to eat. Drinks water and cut back on diet coke (1 can). S: pistachios. Pt reports not feeling hungry during the day, but gets hungry at 7pm. Growling in stomach or headache. will have a banana before working out, suggested adding peanut butter to add satiety. Discussed SMART goals, HH/DM eating, hunger.      Intervention Plan   Intervention  Prescribe, educate and counsel regarding individualized specific dietary modifications aiming towards targeted core components such as weight, hypertension, lipid management, diabetes, heart failure and other comorbidities.;Nutrition handout(s) given to patient.    Expected Outcomes  Short Term Goal: Understand basic principles of dietary content, such as calories, fat, sodium, cholesterol and nutrients.;Short Term Goal: A plan has been developed with personal nutrition goals set during dietitian appointment.;Long Term Goal: Adherence to prescribed nutrition plan.       Nutrition Discharge: Nutrition Assessments - 11/04/19 1720      MEDFICTS Scores   Post Score  15       Education Questionnaire Score: Knowledge Questionnaire Score - 11/04/19 1720      Knowledge Questionnaire Score   Post Score  25/26       Goals reviewed with patient; copy given to patient.

## 2019-11-04 NOTE — Progress Notes (Signed)
Cardiac Individual Treatment Plan  Patient Details  Name: Hector Garcia MRN: [id number] Date of Birth: [birth year] Referring Provider:     Cardiac Rehab from 09/23/2019 in [facility] Cardiac and Pulmonary Rehab  Referring Provider  Isaias Cowman MD      Initial Encounter Date:    Cardiac Rehab from 09/23/2019 in [facility] Cardiac and Pulmonary Rehab  Date  09/23/19      Visit Diagnosis: Status post coronary artery stent placement  Patient's Home Medications on Admission:  Current Outpatient Medications:  .  amLODipine (NORVASC) 10 MG tablet, Take 10 mg by mouth daily. , Disp: , Rfl:  .  Ascorbic Acid (VITAMIN C) 1000 MG tablet, Take 1,000 mg by mouth daily., Disp: , Rfl:  .  aspirin EC 81 MG tablet, Take 81 mg by mouth at bedtime., Disp: , Rfl:  .  atorvastatin (LIPITOR) 20 MG tablet, Take 20 mg by mouth at bedtime., Disp: , Rfl:  .  docusate sodium (COLACE) 100 MG capsule, Take 100 mg by mouth every evening., Disp: , Rfl:  .  doxazosin (CARDURA) 4 MG tablet, Take 4 mg by mouth at bedtime., Disp: , Rfl:  .  empagliflozin (JARDIANCE) 25 MG TABS tablet, Take 25 mg by mouth daily., Disp: , Rfl:  .  Flaxseed, Linseed, (BIO-FLAX) 1000 MG CAPS, Take 1,000 mg by mouth daily., Disp: , Rfl:  .  glimepiride (AMARYL) 2 MG tablet, Take 6 mg by mouth daily. , Disp: , Rfl:  .  lisinopril-hydrochlorothiazide (ZESTORETIC) 20-25 MG tablet, Take 1 tablet by mouth daily., Disp: , Rfl:  .  metFORMIN (GLUCOPHAGE) 500 MG tablet, Take 500-1,000 mg by mouth See admin instructions. Take 1000 mg in the morning and 500 mg in the evening, Disp: , Rfl:  .  metoprolol succinate (TOPROL-XL) 25 MG 24 hr tablet, Take 0.5 tablets (12.5 mg total) by mouth daily., Disp: 30 tablet, Rfl: 11 .  nitroGLYCERIN (NITROSTAT) 0.4 MG SL tablet, Place 0.4 mg under the tongue every 5 (five) minutes as needed for chest pain., Disp: , Rfl:  .  Omega-3 Fatty Acids (FISH OIL) 1200 MG CAPS, Take 1,200 mg by mouth daily., Disp: , Rfl:  .   omeprazole (PRILOSEC) 40 MG capsule, Take 50 mg by mouth daily. , Disp: , Rfl:  .  OVER THE COUNTER MEDICATION, Apply 1 application topically daily. proclearz fungal shield, Disp: , Rfl:  .  Phenylephrine-Pheniramine (DRISTAN NA), Place 1 spray into the nose daily as needed (allergies)., Disp: , Rfl:  .  prasugrel (EFFIENT) 10 MG TABS tablet, Take 1 tablet (10 mg total) by mouth daily., Disp: 30 tablet, Rfl: 11 .  predniSONE (DELTASONE) 10 MG tablet, Take 10-12.5 mg by mouth See admin instructions. Take 12.5 mg every other day for 2 weeks, then 10 mg every other day for 2 weeks, 7.5 mg every other day for 2 weeks, 5 mg every other day for 2 weeks, 2.5 mg every other day for 2 weeks then stop, Disp: , Rfl:  .  zolpidem (AMBIEN) 10 MG tablet, Take 5 mg by mouth at bedtime as needed for sleep., Disp: , Rfl:   Past Medical History: Past Medical History:  Diagnosis Date  . CAD (coronary artery disease)   . Diabetes mellitus without complication ([place])   . Hyperlipidemia   . Hypertension   . Pemphigus     Tobacco Use: Social History   Tobacco Use  Smoking Status Current Some Day Smoker  . Packs/day: 0.50  . Last attempt to  quit: 11/2016  . Years since quitting: 2.9  Smokeless Tobacco Never Used  Tobacco Comment   may smoke 1 pack a week    Labs: Recent Review Flowsheet Data    Labs for ITP Cardiac and Pulmonary Rehab Latest Ref Rng & Units 08/26/2019   Hemoglobin A1c 4.8 - 5.6 % 8.2(H)       Exercise Target Goals: Exercise Program Goal: Individual exercise prescription set using results from initial 6 min walk test and THRR while considering  patient's activity barriers and safety.   Exercise Prescription Goal: Initial exercise prescription builds to 30-45 minutes a day of aerobic activity, 2-3 days per week.  Home exercise guidelines will be given to patient during program as part of exercise prescription that the participant will acknowledge.  Activity Barriers & Risk  Stratification: Activity Barriers & Cardiac Risk Stratification - 09/23/19 1624      Activity Barriers & Cardiac Risk Stratification   Activity Barriers  Neck/Spine Problems;Joint Problems   occasional knee pain   Cardiac Risk Stratification  Moderate       6 Minute Walk: 6 Minute Walk    Row Name 09/23/19 1623 11/04/19 1709       6 Minute Walk   Phase  Initial  Discharge    Distance  1430 feet  1700 feet    Distance % Change  --  18.8 %    Distance Feet Change  --  270 ft    Walk Time  6 minutes  6 minutes    # of Rest Breaks  0  0    MPH  2.71  3.22    METS  3.36  4.6    RPE  7  12    VO2 Peak  11.78  --    Symptoms  No  --    Resting HR  71 bpm  65 bpm    Resting BP  126/70  138/64    Resting Oxygen Saturation   98 %  --    Exercise Oxygen Saturation  during 6 min walk  99 %  --    Max Ex. HR  74 bpm  137 bpm    Max Ex. BP  142/74  150/54    2 Minute Post BP  120/70  --       Oxygen Initial Assessment:   Oxygen Re-Evaluation:   Oxygen Discharge (Final Oxygen Re-Evaluation):   Initial Exercise Prescription: Initial Exercise Prescription - 09/23/19 1600      Date of Initial Exercise RX and Referring Provider   Date  09/23/19    Referring Provider  Isaias Cowman MD      Treadmill   MPH  2.7    Grade  1    Minutes  15    METs  3.44      Recumbant Bike   Level  3    RPM  50    Watts  43    Minutes  15    METs  3      NuStep   Level  3    SPM  80    Minutes  15    METs  3      Recumbant Elliptical   Level  2    RPM  50    Minutes  15    METs  3      T5 Nustep   Level  3    SPM  80    Minutes  15  METs  3      Biostep-RELP   Level  3    SPM  50    Minutes  15    METs  3      Prescription Details   Frequency (times per week)  3    Duration  Progress to 30 minutes of continuous aerobic without signs/symptoms of physical distress      Intensity   THRR 40-80% of Max Heartrate  106-144    Ratings of Perceived Exertion   11-13    Perceived Dyspnea  0-4      Progression   Progression  Continue to progress workloads to maintain intensity without signs/symptoms of physical distress.      Resistance Training   Training Prescription  Yes    Weight  4 lbs    Reps  10-15       Perform Capillary Blood Glucose checks as needed.  Exercise Prescription Changes:  Exercise Prescription Changes    Row Name 09/23/19 1600 10/03/19 1100 10/16/19 0900 10/23/19 1600 10/29/19 1500     Response to Exercise   Blood Pressure (Admit)  126/70  120/68  138/70  --  140/64   Blood Pressure (Exercise)  142/74  148/54  164/62  --  154/60   Blood Pressure (Exit)  120/70  102/58  132/68  --  128/66   Heart Rate (Admit)  71 bpm  79 bpm  59 bpm  --  61 bpm   Heart Rate (Exercise)  74 bpm  105 bpm  75 bpm  --  81 bpm   Heart Rate (Exit)  60 bpm  73 bpm  71 bpm  --  72 bpm   Oxygen Saturation (Admit)  98 %  --  --  --  --   Oxygen Saturation (Exercise)  99 %  --  --  --  --   Rating of Perceived Exertion (Exercise)  '7  12  12  '$ --  12   Symptoms  none  none  none  --  none   Comments  walk test results  --  --  --  --   Duration  --  --  Continue with 30 min of aerobic exercise without signs/symptoms of physical distress.  --  Continue with 30 min of aerobic exercise without signs/symptoms of physical distress.   Intensity  --  --  THRR unchanged  --  THRR unchanged     Progression   Progression  --  --  Continue to progress workloads to maintain intensity without signs/symptoms of physical distress.  --  Continue to progress workloads to maintain intensity without signs/symptoms of physical distress.   Average METs  --  --  3.11  --  3     Resistance Training   Training Prescription  --  Yes  Yes  --  Yes   Weight  --  4 lb  4 lb  --  4 lb   Reps  --  10-15  10-15  --  10-15     Interval Training   Interval Training  --  --  --  --  No     Treadmill   MPH  --  2.7  2.7  --  2.7   Grade  --  1  1  --  1   Minutes  --   15  15  --  15   METs  --  3.44  3.44  --  3.44  Recumbant Bike   Level  --  --  3  --  --   Watts  --  --  33  --  --   Minutes  --  --  15  --  --   METs  --  --  3.1  --  --     NuStep   Level  --  4  4  --  4   SPM  --  80  --  --  80   Minutes  --  15  15  --  15   METs  --  2.8  2.9  --  2.6     Biostep-RELP   Level  --  --  3  --  --   Minutes  --  --  15  --  --   METs  --  --  3  --  --     Home Exercise Plan   Plans to continue exercise at  --  --  --  Home (comment) treadmill and elliptical  Home (comment) treadmill and elliptical   Frequency  --  --  --  Add 2 additional days to program exercise sessions.  Add 2 additional days to program exercise sessions.   Initial Home Exercises Provided  --  --  --  10/23/19  10/23/19      Exercise Comments:  Exercise Comments    Row Name 10/23/19 1655           Exercise Comments  Reviewed home exercise with pt today.  Pt plans to use his [employer] treadmill and elliptical at home for exercise.  Reviewed THR, pulse, RPE, sign and symptoms, NTG use, and when to call 911 or MD.  Also discussed weather considerations and indoor options.  Pt voiced understanding.          Exercise Goals and Review:  Exercise Goals    Row Name 09/23/19 1626             Exercise Goals   Increase Physical Activity  Yes       Intervention  Provide advice, education, support and counseling about physical activity/exercise needs.;Develop an individualized exercise prescription for aerobic and resistive training based on initial evaluation findings, risk stratification, comorbidities and participant's personal goals.       Expected Outcomes  Short Term: Attend rehab on a regular basis to increase amount of physical activity.;Long Term: Add in home exercise to make exercise part of routine and to increase amount of physical activity.;Long Term: Exercising regularly at least 3-5 days a week.       Increase Strength and Stamina  Yes        Intervention  Provide advice, education, support and counseling about physical activity/exercise needs.;Develop an individualized exercise prescription for aerobic and resistive training based on initial evaluation findings, risk stratification, comorbidities and participant's personal goals.       Expected Outcomes  Short Term: Increase workloads from initial exercise prescription for resistance, speed, and METs.;Short Term: Perform resistance training exercises routinely during rehab and add in resistance training at home;Long Term: Improve cardiorespiratory fitness, muscular endurance and strength as measured by increased METs and functional capacity (6MWT)       Able to understand and use rate of perceived exertion (RPE) scale  Yes       Intervention  Provide education and explanation on how to use RPE scale       Expected Outcomes  Short Term: Able to use RPE  daily in rehab to express subjective intensity level;Long Term:  Able to use RPE to guide intensity level when exercising independently       Knowledge and understanding of Target Heart Rate Range (THRR)  Yes       Intervention  Provide education and explanation of THRR including how the numbers were predicted and where they are located for reference       Expected Outcomes  Short Term: Able to state/look up THRR;Short Term: Able to use daily as guideline for intensity in rehab;Long Term: Able to use THRR to govern intensity when exercising independently       Able to check pulse independently  Yes       Intervention  Provide education and demonstration on how to check pulse in carotid and radial arteries.;Review the importance of being able to check your own pulse for safety during independent exercise       Expected Outcomes  Long Term: Able to check pulse independently and accurately;Short Term: Able to explain why pulse checking is important during independent exercise       Understanding of Exercise Prescription  Yes       Intervention   Provide education, explanation, and written materials on patient's individual exercise prescription       Expected Outcomes  Long Term: Able to explain home exercise prescription to exercise independently;Short Term: Able to explain program exercise prescription          Exercise Goals Re-Evaluation : Exercise Goals Re-Evaluation    [place] Name 09/30/19 1653 10/16/19 0943 10/29/19 1518         Exercise Goal Re-Evaluation   Exercise Goals Review  Increase Physical Activity;Able to understand and use rate of perceived exertion (RPE) scale;Knowledge and understanding of Target Heart Rate Range (THRR);Understanding of Exercise Prescription;Increase Strength and Stamina;Able to check pulse independently  Increase Physical Activity;Increase Strength and Stamina;Understanding of Exercise Prescription  Increase Physical Activity;Increase Strength and Stamina;Able to understand and use rate of perceived exertion (RPE) scale;Knowledge and understanding of Target Heart Rate Range (THRR);Able to check pulse independently;Understanding of Exercise Prescription     Comments  Reviewed RPE scale, THR and program prescription with pt today.  Pt voiced understanding and was given a copy of goals to take home.  Yvone Neu is off to a good start in rehab.  He is already up to 33 watts on the recumbent bike!  We will continue to monitor his progress.  Yvone Neu has only attended twice in December.  He wants to graduate early due to insurance reasons.     Expected Outcomes  Short: Use RPE daily to regulate intensity. Long: Follow program prescription in THR.  Short: Review home exercise guidelines.  Long: Continue to rebuild strength and stamina.  Short - complete as many sessions as possible before end of year Long - maintain exercise on his own        Discharge Exercise Prescription (Final Exercise Prescription Changes): Exercise Prescription Changes - 10/29/19 1500      Response to Exercise   Blood Pressure (Admit)  140/64     Blood Pressure (Exercise)  154/60    Blood Pressure (Exit)  128/66    Heart Rate (Admit)  61 bpm    Heart Rate (Exercise)  81 bpm    Heart Rate (Exit)  72 bpm    Rating of Perceived Exertion (Exercise)  12    Symptoms  none    Duration  Continue with 30 min of aerobic exercise without signs/symptoms  of physical distress.    Intensity  THRR unchanged      Progression   Progression  Continue to progress workloads to maintain intensity without signs/symptoms of physical distress.    Average METs  3      Resistance Training   Training Prescription  Yes    Weight  4 lb    Reps  10-15      Interval Training   Interval Training  No      Treadmill   MPH  2.7    Grade  1    Minutes  15    METs  3.44      NuStep   Level  4    SPM  80    Minutes  15    METs  2.6      Home Exercise Plan   Plans to continue exercise at  Home (comment)   treadmill and elliptical   Frequency  Add 2 additional days to program exercise sessions.    Initial Home Exercises Provided  10/23/19       Nutrition:  Target Goals: Understanding of nutrition guidelines, daily intake of sodium '1500mg'$ , cholesterol '200mg'$ , calories 30% from fat and 7% or less from saturated fats, daily to have 5 or more servings of fruits and vegetables.  Biometrics: Pre Biometrics - 09/23/19 1627      Pre Biometrics   Height  6' 0.1" (1.831 m)    Weight  218 lb 6.4 oz (99.1 kg)    BMI (Calculated)  29.55    Single Leg Stand  30 seconds      Post Biometrics - 11/04/19 1711       Post  Biometrics   Height  6' 0.1" (1.831 m)    Weight  218 lb (98.9 kg)    BMI (Calculated)  29.5    Single Leg Stand  30 seconds       Nutrition Therapy Plan and Nutrition Goals: Nutrition Therapy & Goals - 10/21/19 1001      Nutrition Therapy   Diet  Low Na, HH, DM diet    Drug/Food Interactions  Statins/Certain Fruits    Protein (specify units)  80g    Fiber  30 grams    Whole Grain Foods  3 servings    Saturated Fats  12 max.  grams    Fruits and Vegetables  5 servings/day    Sodium  1.5 grams      Personal Nutrition Goals   Nutrition Goal  ST: LT: Graduate rehab in December, quit smoking (w/o flare ups of autoimmune disease), increasing exercising    Comments  Smoking ~7-10 cig/day. On Metformin (A1c ~7), has been on steroids since February. Just ended steroid yesterday. Son helping pt with diet. (85/15 rule). B: slimfast (in rush) or bagel thin w/ cream cheese and orange juice. L: [place] or ham sanwich on wheat with cheese, mayo and mustard. D: Baked chicken (brush with butter with seasonings), with vegetables and brown rice (salads or frozen vegetables). Pt also eats more salmon esp when out to eat. Drinks water and cut back on diet coke (1 can). S: pistachios. Pt reports not feeling hungry during the day, but gets hungry at 7pm. Growling in stomach or headache. will have a banana before working out, suggested adding peanut butter to add satiety. Discussed SMART goals, HH/DM eating, hunger.      Intervention Plan   Intervention  Prescribe, educate and counsel regarding individualized specific dietary modifications aiming towards targeted  core components such as weight, hypertension, lipid management, diabetes, heart failure and other comorbidities.;Nutrition handout(s) given to patient.    Expected Outcomes  Short Term Goal: Understand basic principles of dietary content, such as calories, fat, sodium, cholesterol and nutrients.;Short Term Goal: A plan has been developed with personal nutrition goals set during dietitian appointment.;Long Term Goal: Adherence to prescribed nutrition plan.       Nutrition Assessments: Nutrition Assessments - 11/04/19 1720      MEDFICTS Scores   Post Score  15       Nutrition Goals Re-Evaluation:   Nutrition Goals Discharge (Final Nutrition Goals Re-Evaluation):   Psychosocial: Target Goals: Acknowledge presence or absence of significant depression and/or stress, maximize  coping skills, provide positive support system. Participant is able to verbalize types and ability to use techniques and skills needed for reducing stress and depression.   Initial Review & Psychosocial Screening: Initial Psych Review & Screening - 09/18/19 1113      Initial Review   Current issues with  Current Sleep Concerns;Current Stress Concerns   takes ambien but gets at least 6 hrs each night   Source of Stress Concerns  Occupation    Comments  Work is his major stressor. 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      Family Dynamics   Good Support System?  Yes   family, sister, brother, son, girlfrend, friends     Barriers   Psychosocial barriers to participate in program  The patient should benefit from training in stress management and relaxation.;Psychosocial barriers identified (see note)      Screening Interventions   Interventions  Encouraged to exercise;To provide support and resources with identified psychosocial needs;Provide feedback about the scores to participant    Expected Outcomes  Short Term goal: Utilizing psychosocial counselor, staff and physician to assist with identification of specific Stressors or current issues interfering with healing process. Setting desired goal for each stressor or current issue identified.;Long Term Goal: Stressors or current issues are controlled or eliminated.;Short Term goal: Identification and review with participant of any Quality of Life or Depression concerns found by scoring the questionnaire.;Long Term goal: The participant improves quality of Life and PHQ9 Scores as seen by post scores and/or verbalization of changes       Quality of Life Scores:  Quality of Life - 11/04/19 1720      Quality of Life   Select  Quality of Life      Quality of Life Scores   Health/Function Post  23.4 %    Socioeconomic Post  25 %    Psych/Spiritual Post  27.14 %    Family Post  27.5 %    GLOBAL Post  25.1 %       Scores of 19 and below usually indicate a poorer quality of life in these areas.  A difference of  2-3 points is a clinically meaningful difference.  A difference of 2-3 points in the total score of the Quality of Life Index has been associated with significant improvement in overall quality of life, self-image, physical symptoms, and general health in studies assessing change in quality of life.  PHQ-9: Recent Review Flowsheet Data    Depression screen [facility] 2/9 11/04/2019 09/23/2019   Decreased Interest 0 0   Down, Depressed, Hopeless 0 0   PHQ - 2 Score 0 0   Altered sleeping 0 0   Tired, decreased energy 0 0   Change in  appetite 1 0   Feeling bad or failure about yourself  0 0   Trouble concentrating 0 0   Moving slowly or fidgety/restless 0 0   Suicidal thoughts 0 0   PHQ-9 Score 1 0   Difficult doing work/chores - Not difficult at all     Interpretation of Total Score  Total Score Depression Severity:  1-4 = Minimal depression, 5-9 = Mild depression, 10-14 = Moderate depression, 15-19 = Moderately severe depression, 20-27 = Severe depression   Psychosocial Evaluation and Intervention: Psychosocial Evaluation - 09/18/19 1106      Psychosocial Evaluation & Interventions   Comments  He has an autoimmune disease and has been on prednisone since February with taper now until 12/7.  Currently also working on quitting smoking. He is back to work already and has a stressful job.       Psychosocial Re-Evaluation:   Psychosocial Discharge (Final Psychosocial Re-Evaluation):   Vocational Rehabilitation: Provide vocational rehab assistance to qualifying candidates.   Vocational Rehab Evaluation & Intervention: Vocational Rehab - 09/18/19 1116      Initial Vocational Rehab Evaluation & Intervention   Assessment shows need for Vocational Rehabilitation  No       Education: Education Goals: Education classes will be provided on a variety of topics geared toward better  understanding of heart health and risk factor modification. Participant will state understanding/return demonstration of topics presented as noted by education test scores.  Learning Barriers/Preferences: Learning Barriers/Preferences - 09/18/19 1116      Learning Barriers/Preferences   Learning Barriers  Sight   reading glasses   Learning Preferences  None       Education Topics:  AED/CPR: - Group verbal and written instruction with the use of models to demonstrate the basic use of the AED with the basic ABC's of resuscitation.   General Nutrition Guidelines/Fats and Fiber: -Group instruction provided by verbal, written material, models and posters to present the general guidelines for heart healthy nutrition. Gives an explanation and review of dietary fats and fiber.   Controlling Sodium/Reading Food Labels: -Group verbal and written material supporting the discussion of sodium use in heart healthy nutrition. Review and explanation with models, verbal and written materials for utilization of the food label.   Exercise Physiology & General Exercise Guidelines: - Group verbal and written instruction with models to review the exercise physiology of the cardiovascular system and associated critical values. Provides general exercise guidelines with specific guidelines to those with heart or lung disease.    Aerobic Exercise & Resistance Training: - Gives group verbal and written instruction on the various components of exercise. Focuses on aerobic and resistive training programs and the benefits of this training and how to safely progress through these programs..   Flexibility, Balance, Mind/Body Relaxation: Provides group verbal/written instruction on the benefits of flexibility and balance training, including mind/body exercise modes such as yoga, pilates and tai chi.  Demonstration and skill practice provided.   Stress and Anxiety: - Provides group verbal and written  instruction about the health risks of elevated stress and causes of high stress.  Discuss the correlation between heart/lung disease and anxiety and treatment options. Review healthy ways to manage with stress and anxiety.   Depression: - Provides group verbal and written instruction on the correlation between heart/lung disease and depressed mood, treatment options, and the stigmas associated with seeking treatment.   Anatomy & Physiology of the Heart: - Group verbal and written instruction and models provide basic cardiac anatomy  and physiology, with the coronary electrical and arterial systems. Review of Valvular disease and Heart Failure   Cardiac Procedures: - Group verbal and written instruction to review commonly prescribed medications for heart disease. Reviews the medication, class of the drug, and side effects. Includes the steps to properly store meds and maintain the prescription regimen. (beta blockers and nitrates)   Cardiac Medications I: - Group verbal and written instruction to review commonly prescribed medications for heart disease. Reviews the medication, class of the drug, and side effects. Includes the steps to properly store meds and maintain the prescription regimen.   Cardiac Medications II: -Group verbal and written instruction to review commonly prescribed medications for heart disease. Reviews the medication, class of the drug, and side effects. (all other drug classes)    Go Sex-Intimacy & Heart Disease, Get SMART - Goal Setting: - Group verbal and written instruction through game format to discuss heart disease and the return to sexual intimacy. Provides group verbal and written material to discuss and apply goal setting through the application of the S.M.A.R.T. Method.   Other Matters of the Heart: - Provides group verbal, written materials and models to describe Stable Angina and Peripheral Artery. Includes description of the disease process and treatment  options available to the cardiac patient.   Exercise & Equipment Safety: - Individual verbal instruction and demonstration of equipment use and safety with use of the equipment.   Cardiac Rehab from 09/23/2019 in [facility]  Date  09/23/19  Educator  [facility]  Instruction Review Code  1- Verbalizes Understanding      Infection Prevention: - Provides verbal and written material to individual with discussion of infection control including proper hand washing and proper equipment cleaning during exercise session.   Cardiac Rehab from 09/23/2019 in [facility]  Date  09/23/19  Educator  [facility]  Instruction Review Code  1- Verbalizes Understanding      Falls Prevention: - Provides verbal and written material to individual with discussion of falls prevention and safety.   Cardiac Rehab from 09/23/2019 in [facility]  Date  09/23/19  Educator  [facility]  Instruction Review Code  1- Verbalizes Understanding      Diabetes: - Individual verbal and written instruction to review signs/symptoms of diabetes, desired ranges of glucose level fasting, after meals and with exercise. Acknowledge that pre and post exercise glucose checks will be done for 3 sessions at entry of program.   Know Your Numbers and Risk Factors: -Group verbal and written instruction about important numbers in your health.  Discussion of what are risk factors and how they play a role in the disease process.  Review of Cholesterol, Blood Pressure, Diabetes, and BMI and the role they play in your overall health.   Sleep Hygiene: -Provides group verbal and written instruction about how sleep can affect your health.  Define sleep hygiene, discuss sleep cycles and impact of sleep habits. Review good sleep hygiene tips.    Other: -Provides group and verbal instruction on various topics (see comments)   Knowledge Questionnaire Score: Knowledge Questionnaire Score - 11/04/19  1720      Knowledge Questionnaire Score   Post Score  25/26       Core Components/Risk Factors/Patient Goals at Admission: Personal Goals and Risk Factors at Admission - 09/23/19 1627      Core Components/Risk Factors/Patient Goals on Admission    Weight Management  Yes;Weight Loss    Intervention  Weight Management: Develop a combined nutrition and exercise program designed to reach desired caloric intake, while maintaining appropriate intake of nutrient and fiber, sodium and fats, and appropriate energy expenditure required for the weight goal.;Weight Management: Provide education and appropriate resources to help participant work on and attain dietary goals.    Admit Weight  218 lb 6.4 oz (99.1 kg)    Goal Weight: Short Term  213 lb (96.6 kg)    Goal Weight: Long Term  200 lb (90.7 kg)    Expected Outcomes  Short Term: Continue to assess and modify interventions until short term weight is achieved;Long Term: Adherence to nutrition and physical activity/exercise program aimed toward attainment of established weight goal;Weight Loss: Understanding of general recommendations for a balanced deficit meal plan, which promotes 1-2 lb weight loss per week and includes a negative energy balance of [id number] kcal/d;Understanding recommendations for meals to include 15-35% energy as protein, 25-35% energy from fat, 35-60% energy from carbohydrates, less than '200mg'$  of dietary cholesterol, 20-35 gm of total fiber daily;Understanding of distribution of calorie intake throughout the day with the consumption of 4-5 meals/snacks    Tobacco Cessation  Yes    Number of packs per day  0.5    Intervention  Assist the participant in steps to quit. Provide individualized education and counseling about committing to Tobacco Cessation, relapse prevention, and pharmacological support that can be provided by physician.;Advice worker, assist with locating and accessing local/national Quit Smoking programs,  and support quit date choice.    Expected Outcomes  Short Term: Will quit all tobacco product use, adhering to prevention of relapse plan.;Short Term: Will demonstrate readiness to quit, by selecting a quit date.;Long Term: Complete abstinence from all tobacco products for at least 12 months from quit date.    Diabetes  Yes    Intervention  Provide education about signs/symptoms and action to take for hypo/hyperglycemia.;Provide education about proper nutrition, including hydration, and aerobic/resistive exercise prescription along with prescribed medications to achieve blood glucose in normal ranges: Fasting glucose 65-99 mg/dL    Expected Outcomes  Short Term: Participant verbalizes understanding of the signs/symptoms and immediate care of hyper/hypoglycemia, proper foot care and importance of medication, aerobic/resistive exercise and nutrition plan for blood glucose control.;Long Term: Attainment of HbA1C < 7%.    Hypertension  Yes    Intervention  Provide education on lifestyle modifcations including regular physical activity/exercise, weight management, moderate sodium restriction and increased consumption of fresh fruit, vegetables, and low fat dairy, alcohol moderation, and smoking cessation.;Monitor prescription use compliance.    Expected Outcomes  Short Term: Continued assessment and intervention until BP is < 140/47m HG in hypertensive participants. < 130/854mHG in hypertensive participants with diabetes, heart failure or chronic kidney disease.;Long Term: Maintenance of blood pressure at goal levels.    Lipids  Yes    Intervention  Provide education and support for participant on nutrition & aerobic/resistive exercise along with prescribed medications to achieve LDL '70mg'$ , HDL >'40mg'$ .    Expected Outcomes  Short Term: Participant states understanding of desired cholesterol values and is compliant with medications prescribed. Participant is following exercise prescription and nutrition  guidelines.;Long Term: Cholesterol controlled with medications as prescribed, with individualized exercise RX and with personalized nutrition plan. Value goals: LDL < '70mg'$ , HDL > 40 mg.       Core Components/Risk Factors/Patient Goals Review:    Core Components/Risk Factors/Patient Goals at Discharge (Final Review):    ITP Comments: ITP Comments    Row Name  09/18/19 1125 09/23/19 1623 09/30/19 1653 10/16/19 0915 10/21/19 1029   ITP Comments  Completed virtual orientation today.  Documentation for diagnosis can be found in [facility] encounter 10/12.  EP eval scheduled for 11/9.  Completed 6MWT and gym orientation.  Initial ITP created and sent for review to Dr. Emily Filbert, Medical Director.  First full day of exercise!  Patient was oriented to gym and equipment including functions, settings, policies, and procedures.  Patient's individual exercise prescription and treatment plan were reviewed.  All starting workloads were established based on the results of the 6 minute walk test done at initial orientation visit.  The plan for exercise progression was also introduced and progression will be customized based on patient's performance and goals  30 day review competed . ITP sent to Dr Emily Filbert for review, changes as needed and ITP approval signature.  Completed Initial RD Evaluation   Row Name 10/23/19 1655 11/04/19 1701         ITP Comments  Reviewed home exercise with pt today.  Pt plans to use his [employer] treadmill and elliptical at home for exercise.  Reviewed THR, pulse, RPE, sign and symptoms, NTG use, and when to call 911 or MD.  Also discussed weather considerations and indoor options.  Pt voiced understanding.  [place] graduated today from  rehab with 9 sessions completed.  Details of the patient's exercise prescription and what He needs to do in order to continue the prescription and progress were discussed with patient.  Patient was given a copy of prescription and goals.  Patient verbalized  understanding.  Junius plans to continue to exercise by walking at home.         Comments: Discharge ITP

## 2020-04-09 DIAGNOSIS — Z86006 Personal history of melanoma in-situ: Secondary | ICD-10-CM

## 2020-04-09 HISTORY — DX: Personal history of melanoma in-situ: Z86.006

## 2020-05-13 ENCOUNTER — Encounter: Payer: Self-pay | Admitting: Dermatology

## 2020-05-13 ENCOUNTER — Ambulatory Visit: Payer: 59 | Admitting: Dermatology

## 2020-05-13 ENCOUNTER — Other Ambulatory Visit: Payer: Self-pay

## 2020-05-13 DIAGNOSIS — I872 Venous insufficiency (chronic) (peripheral): Secondary | ICD-10-CM

## 2020-05-13 DIAGNOSIS — L100 Pemphigus vulgaris: Secondary | ICD-10-CM

## 2020-05-13 DIAGNOSIS — D1800 Hemangioma unspecified site: Secondary | ICD-10-CM | POA: Diagnosis not present

## 2020-05-13 DIAGNOSIS — L570 Actinic keratosis: Secondary | ICD-10-CM | POA: Diagnosis not present

## 2020-05-13 DIAGNOSIS — L814 Other melanin hyperpigmentation: Secondary | ICD-10-CM

## 2020-05-13 DIAGNOSIS — Z1283 Encounter for screening for malignant neoplasm of skin: Secondary | ICD-10-CM

## 2020-05-13 DIAGNOSIS — D235 Other benign neoplasm of skin of trunk: Secondary | ICD-10-CM

## 2020-05-13 DIAGNOSIS — Z86006 Personal history of melanoma in-situ: Secondary | ICD-10-CM

## 2020-05-13 DIAGNOSIS — Z86018 Personal history of other benign neoplasm: Secondary | ICD-10-CM

## 2020-05-13 DIAGNOSIS — B353 Tinea pedis: Secondary | ICD-10-CM

## 2020-05-13 DIAGNOSIS — L821 Other seborrheic keratosis: Secondary | ICD-10-CM

## 2020-05-13 DIAGNOSIS — D229 Melanocytic nevi, unspecified: Secondary | ICD-10-CM

## 2020-05-13 DIAGNOSIS — D239 Other benign neoplasm of skin, unspecified: Secondary | ICD-10-CM

## 2020-05-13 MED ORDER — KETOCONAZOLE 2 % EX CREA: 1.0000 "application " | TOPICAL_CREAM | Freq: Every day | CUTANEOUS | 3 refills | Status: DC

## 2020-05-13 NOTE — Patient Instructions (Signed)
Cryotherapy Aftercare  . Wash gently with soap and water everyday.   . Apply Vaseline and Band-Aid daily until healed.  

## 2020-05-13 NOTE — Progress Notes (Signed)
Follow-Up Visit   Subjective  Hector Garcia is a 64 y.o. male who presents for the following: Annual Exam (Total body skin exam, hx Melanoma IS L cheek 04/09/20, hx dysplastic nevi). The patient presents for Total-Body Skin Exam (TBSE) for skin cancer screening and mole check.  The following portions of the chart were reviewed this encounter and updated as appropriate:  Tobacco  Allergies  Meds  Problems  Med Hx  Surg Hx  Fam Hx     Review of Systems:  No other skin or systemic complaints except as noted in HPI or Assessment and Plan.  Objective  Well appearing patient in no apparent distress; mood and affect are within normal limits.  A full examination was performed including scalp, head, eyes, ears, nose, lips, neck, chest, axillae, abdomen, back, buttocks, bilateral upper extremities, bilateral lower extremities, hands, feet, fingers, toes, fingernails, and toenails. All findings within normal limits unless otherwise noted below.  Objective  Left cheek: L cheek scar - No evidence of recurrence today - No lymphadenopathy - Recommend regular full body skin exams - Recommend daily broad spectrum sunscreen SPF 30+ to sun-exposed areas, reapply every 2 hours as needed.  - Call if any new or changing lesions are noted between office visits   Objective  multiple: Scars with no evidence of recurrence.   Objective  face x 3 (3): Pink scaly macules   Objective  bil lower legs: Stasis changes  Objective  bil feet/toenails: Scale bil feet  Objective  Left periumbilical: Firm pink/brown papulenodule with dimple sign.    Assessment & Plan    Lentigines - Scattered tan macules - Discussed due to sun exposure - Benign, observe - Call for any changes  Seborrheic Keratoses - Stuck-on, waxy, tan-brown papules and plaques  - Discussed benign etiology and prognosis. - Observe - Call for any changes  Melanocytic Nevi - Tan-brown and/or pink-flesh-colored  symmetric macules and papules - Benign appearing on exam today - Observation - Call clinic for new or changing moles - Recommend daily use of broad spectrum spf 30+ sunscreen to sun-exposed areas.   Hemangiomas - Red papules - Discussed benign nature - Observe - Call for any changes  Actinic Damage - diffuse scaly erythematous macules with underlying dyspigmentation - Recommend daily broad spectrum sunscreen SPF 30+ to sun-exposed areas, reapply every 2 hours as needed.  - Call for new or changing lesions.  Skin cancer screening performed today.   Hx of melanoma in situ Left cheek  Clear. Observe for recurrence. Call clinic for new or changing lesions.  Recommend regular skin exams, daily broad-spectrum spf 30+ sunscreen use, and photoprotection.     History of dysplastic nevus multiple  Clear, observe for changes   Pemphigus vulgaris throat  Controlled/stable per pt Pt indicates he flares when he stops smoking  Pt is followed by Dr. Sharyne Peach, and was previously followed by Dr. Ferrel Logan  Pt is currently on imuran  AK (actinic keratosis) (3) face x 3  Destruction of lesion - face x 3 Complexity: simple   Destruction method: cryotherapy   Informed consent: discussed and consent obtained   Timeout:  patient name, date of birth, surgical site, and procedure verified Lesion destroyed using liquid nitrogen: Yes   Region frozen until ice ball extended beyond lesion: Yes   Outcome: patient tolerated procedure well with no complications   Post-procedure details: wound care instructions given    Venous stasis dermatitis of right lower extremity bil lower legs  Stasis  Changes, Benign  Tinea pedis of left foot bil feet/toenails  And Tinea Unguium  Start Ketoconazole 2% cr qhs  Will consider Oral txt when pt off Imuran Discussed if and when pt takes oral txt he will need to be off Atorvastatin.  ketoconazole (NIZORAL) 2 % cream - bil  feet/toenails  Dermatofibroma Left periumbilical  Benign, observe  Skin cancer screening  Return in about 3 months (around 08/13/2020) for TBSE.   I, Othelia Pulling, RMA, am acting as scribe for Sarina Ser, MD .  Documentation: I have reviewed the above documentation for accuracy and completeness, and I agree with the above.  Sarina Ser, MD

## 2020-05-25 ENCOUNTER — Encounter: Payer: Self-pay | Admitting: Dermatology

## 2020-08-10 IMAGING — MR MR SHOULDER*L* W/O CM
4 of 5 series · 19 of 40 positions shown · non-contrast
Comparison: None.

CLINICAL DATA: Chronic left shoulder pain. History of prior
shoulder surgery.

EXAM:
MRI OF THE LEFT SHOULDER WITHOUT CONTRAST
TECHNIQUE: Multiplanar, multisequence MR imaging of the shoulder was performed.
No intravenous contrast was administered.

[Series 6: PD fat-sat · axial · left · 4.0mm · 0.47mm/px · z∈[-123,-23]mm · 8 of 25 slices shown (1 of 2)]
[im 1/25]
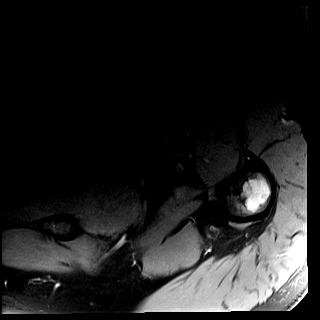
[im 4/25]
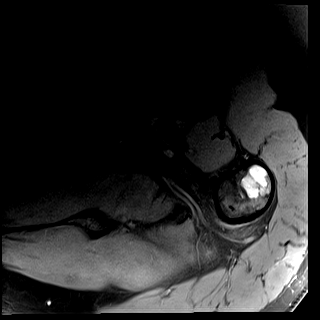
[im 7/25]
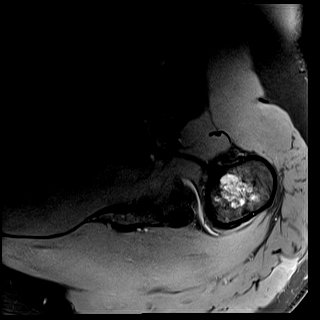
[im 11/25]
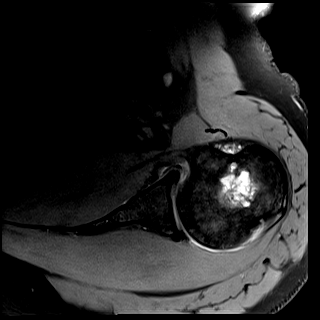
[im 14/25]
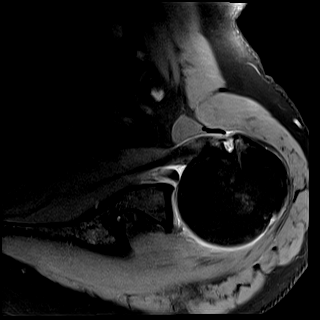
[im 18/25]
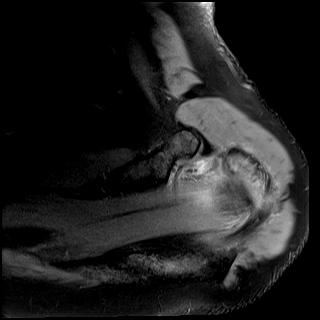
[im 21/25]
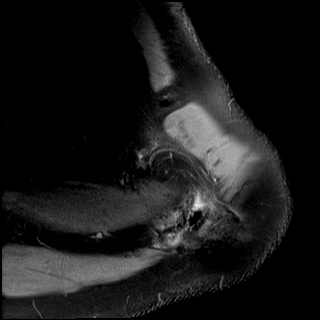
[im 25/25]
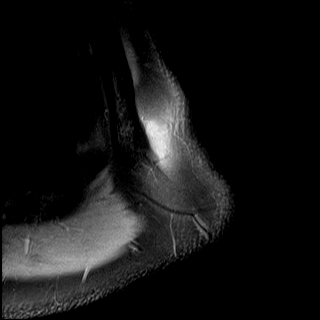

[Series 7: T1 · coronal · left · 4.0mm · 0.27mm/px · 3 of 26 slices shown]
[im 4/26]
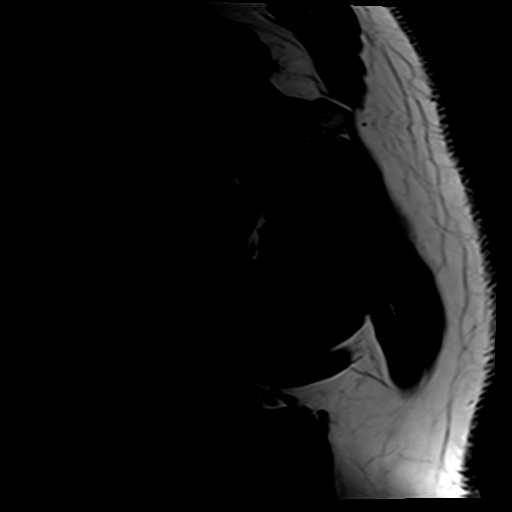
[im 13/26]
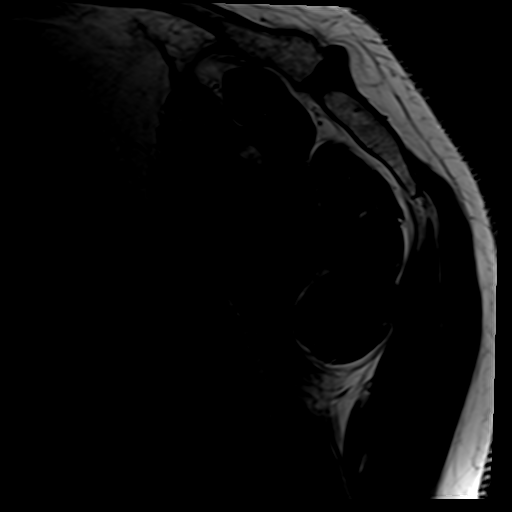
[im 22/26]
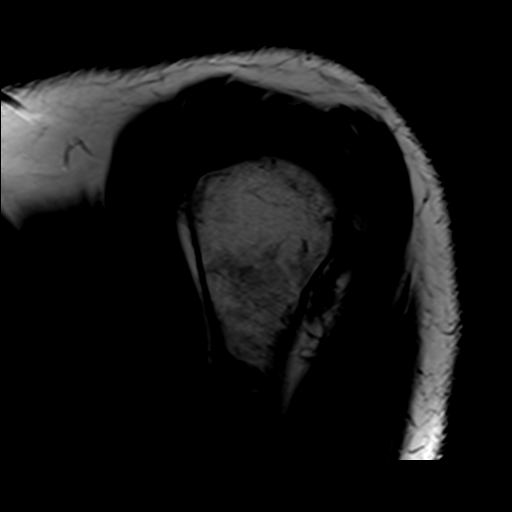

[Series 9: T2 fat-sat · oblique · left · 4.0mm · 0.23mm/px · 3 of 21 slices shown]
[im 4/21]
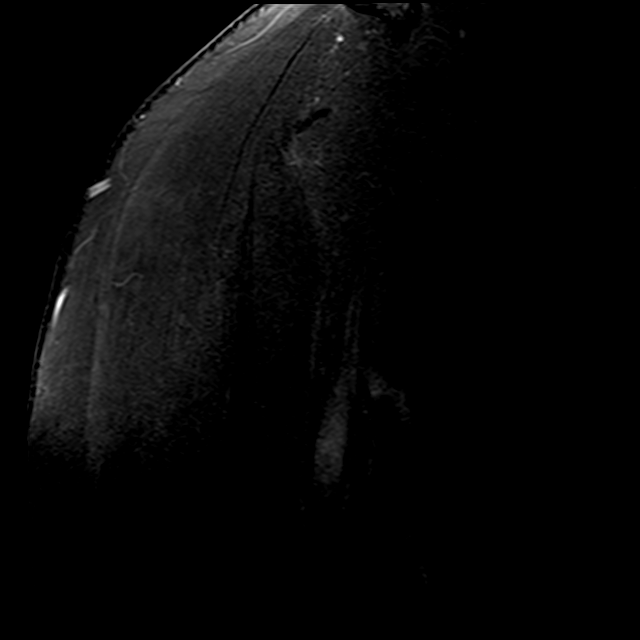
[im 11/21]
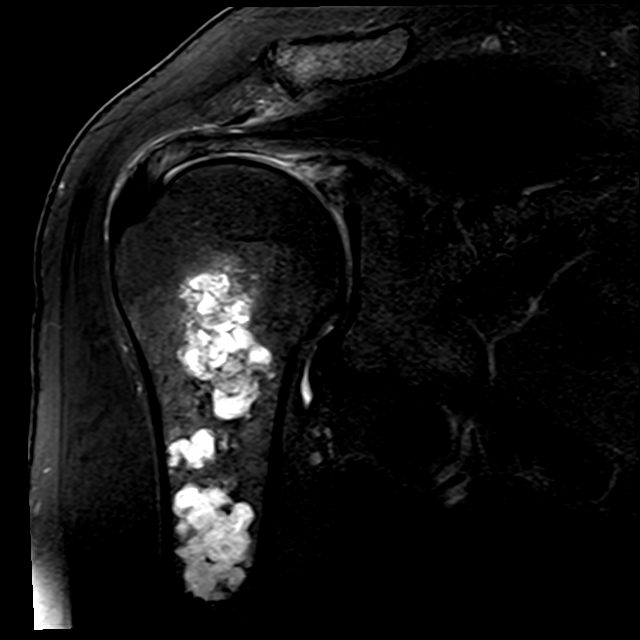
[im 17/21]
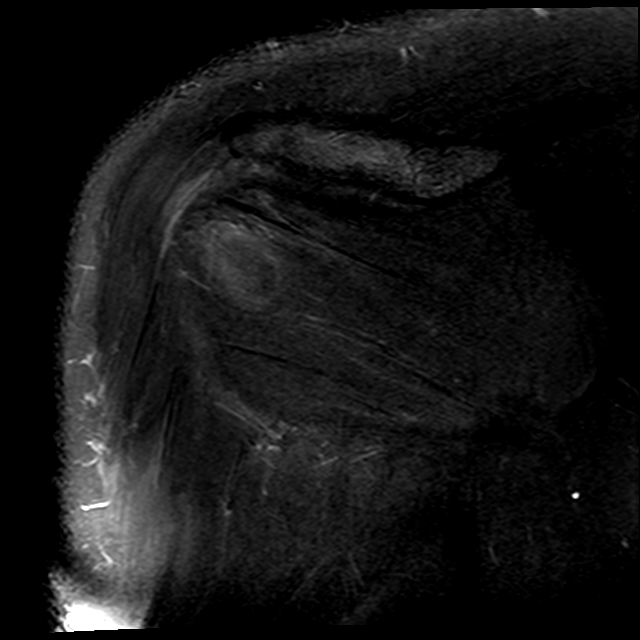

[Series 10: PD fat-sat · oblique · left · 4.0mm · 0.23mm/px · 5 of 21 slices shown (2 of 2)]
[im 1/21]
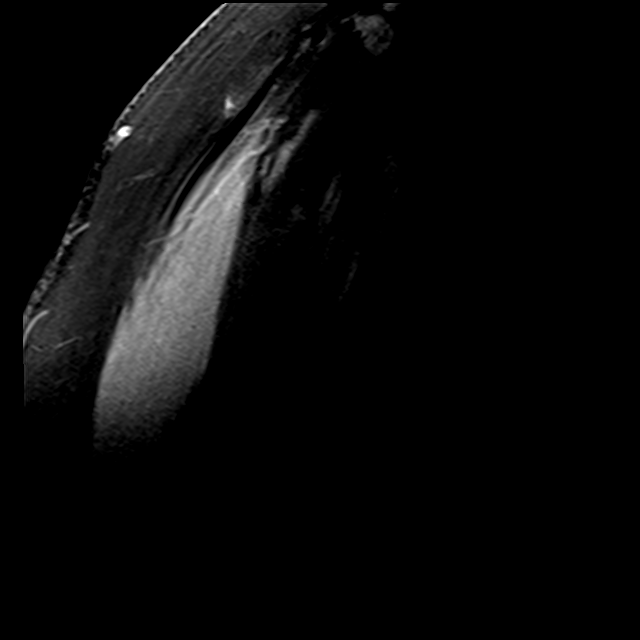
[im 4/21]
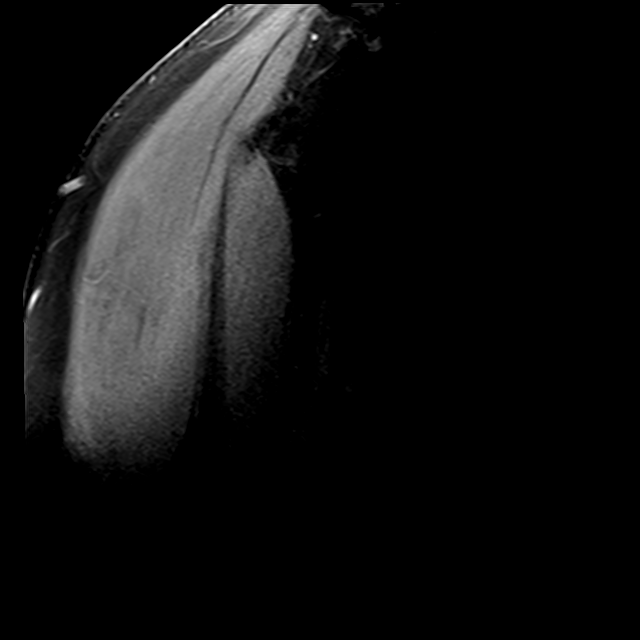
[im 7/21]
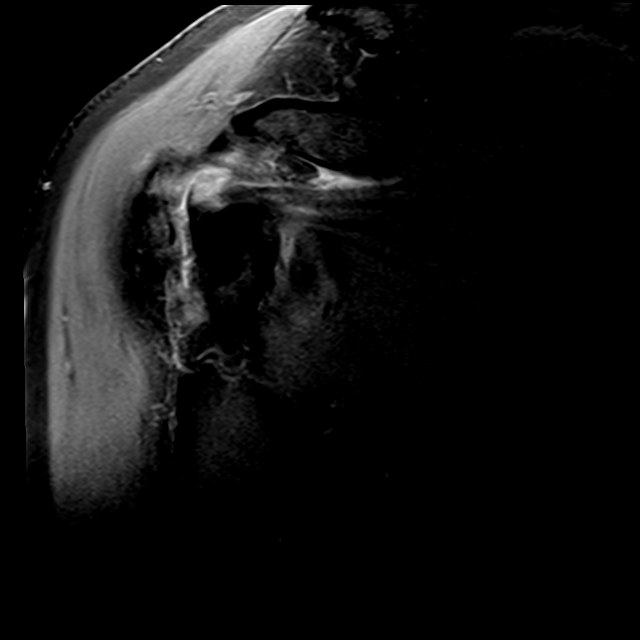
[im 11/21]
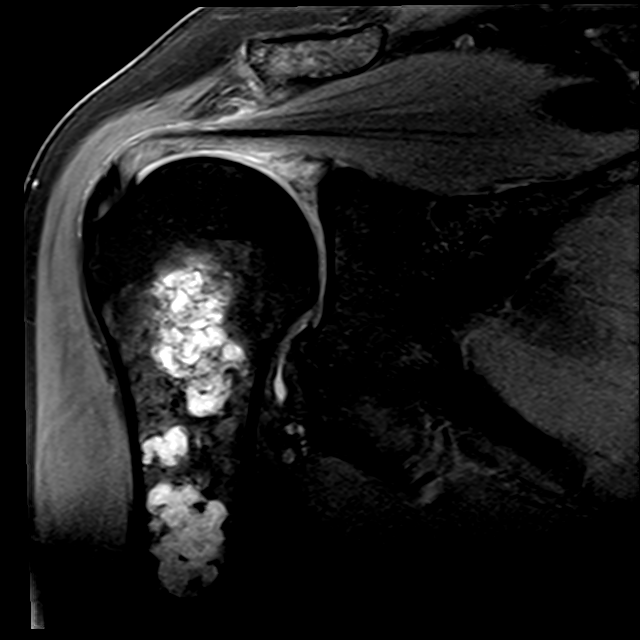
[im 17/21]
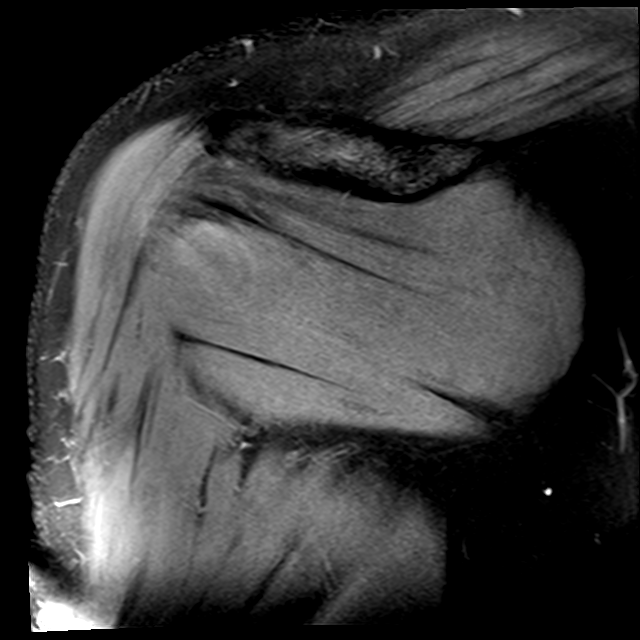

[19 of 40 positions shown; findings below may reference images not displayed]

FINDINGS: Rotator cuff: Heterogeneously increased T2 signal and thickening of
the rotator cuff tendons consistent with tendinopathy appear worst
in the supraspinatus. No tear is identified.

Muscles:  Normal without atrophy or focal lesion.

Biceps long head: The tendon is completely torn from the superior
labrum.

Acromioclavicular Joint: Moderate degenerative change is present.
Type 1 acromion. Tiny amount of fluid in the subacromial/subdeltoid
bursa.

Glenohumeral Joint: Preserved.

Labrum:  The superior labrum is degenerated and torn.

Bones: There is a chondroid lesion extending from the proximal
metaphysis of the humerus into the diaphysis. The lesion measures at
least 8.5 cm craniocaudal by 2 cm AP by 2 cm transverse. The lesion
abuts the lateral cortex of the humerus but no endosteal scalloping,
cortical break or soft tissue mass is identified.

Other: None.
IMPRESSION: Large chondroid lesion in the proximal humerus cannot be
definitively characterized. Although no endosteal scalloping or soft
tissue mass is identified, its size raises the possibility of
low-grade chondrosarcoma. Recommend consultation with Orthopedic
Oncology. Whole-body bone scan to compare uptake of the lesion to
the anterior, superior iliac crest could also be used for further
evaluation. Complete tear of the long head of biceps from the
superior labrum.

Degenerated and torn superior labrum.

Rotator cuff tendinopathy without tear.

Moderate acromioclavicular osteoarthritis.

## 2020-08-18 ENCOUNTER — Ambulatory Visit: Payer: 59 | Admitting: Dermatology

## 2020-10-06 ENCOUNTER — Ambulatory Visit: Payer: 59 | Admitting: Dermatology

## 2020-10-22 ENCOUNTER — Ambulatory Visit: Payer: 59 | Admitting: Dermatology

## 2020-10-22 ENCOUNTER — Other Ambulatory Visit: Payer: Self-pay

## 2020-10-22 ENCOUNTER — Encounter: Payer: Self-pay | Admitting: Dermatology

## 2020-10-22 DIAGNOSIS — B353 Tinea pedis: Secondary | ICD-10-CM

## 2020-10-22 DIAGNOSIS — L821 Other seborrheic keratosis: Secondary | ICD-10-CM

## 2020-10-22 DIAGNOSIS — L304 Erythema intertrigo: Secondary | ICD-10-CM | POA: Diagnosis not present

## 2020-10-22 DIAGNOSIS — I872 Venous insufficiency (chronic) (peripheral): Secondary | ICD-10-CM

## 2020-10-22 DIAGNOSIS — L100 Pemphigus vulgaris: Secondary | ICD-10-CM

## 2020-10-22 DIAGNOSIS — Z1283 Encounter for screening for malignant neoplasm of skin: Secondary | ICD-10-CM

## 2020-10-22 DIAGNOSIS — D229 Melanocytic nevi, unspecified: Secondary | ICD-10-CM

## 2020-10-22 DIAGNOSIS — D1800 Hemangioma unspecified site: Secondary | ICD-10-CM

## 2020-10-22 DIAGNOSIS — L817 Pigmented purpuric dermatosis: Secondary | ICD-10-CM

## 2020-10-22 DIAGNOSIS — L578 Other skin changes due to chronic exposure to nonionizing radiation: Secondary | ICD-10-CM

## 2020-10-22 DIAGNOSIS — Z86018 Personal history of other benign neoplasm: Secondary | ICD-10-CM

## 2020-10-22 DIAGNOSIS — Z86006 Personal history of melanoma in-situ: Secondary | ICD-10-CM

## 2020-10-22 DIAGNOSIS — L814 Other melanin hyperpigmentation: Secondary | ICD-10-CM

## 2020-10-22 MED ORDER — TERBINAFINE HCL 250 MG PO TABS: 250.0000 mg | ORAL_TABLET | Freq: Every day | ORAL | 0 refills | Status: DC

## 2020-10-22 NOTE — Progress Notes (Signed)
 Follow-Up Visit   Subjective  Hector Garcia is a 64 y.o. male who presents for the following: Annual Exam (Hx of MMIS - L cheek, dysplastic nevi, and Ak's.). The patient presents for Total-Body Skin Exam (TBSE) for skin cancer screening and mole check.  The following portions of the chart were reviewed this encounter and updated as appropriate:   Tobacco  Allergies  Meds  Problems  Med Hx  Surg Hx  Fam Hx     Review of Systems:  No other skin or systemic complaints except as noted in HPI or Assessment and Plan.  Objective  Well appearing patient in no apparent distress; mood and affect are within normal limits.  A full examination was performed including scalp, head, eyes, ears, nose, lips, neck, chest, axillae, abdomen, back, buttocks, bilateral upper extremities, bilateral lower extremities, hands, feet, fingers, toes, fingernails, and toenails. All findings within normal limits unless otherwise noted below.  Objective  Left Axilla: Erythema   Objective  B/L lower leg: Stasis changes  Objective  B/L foot: Scale with toenail dystrophy   Assessment & Plan  Erythema intertrigo Left Axilla Asymptomatic - no tx needed.  Persistent  Stasis dermatitis of both legs B/L lower leg With shamberg's purpura and varicose veins -  Graduated compression stockings  Pemphigus vulgaris -persistent recurrent Neck Recurs every time he quits smoking - patient has been off of Imuran for a while now, and being followed by [facility] dermat[facility].   Tinea pedis of both feet B/L foot Recurrent, persistent with tinea unguium -  Reviewed lab results :  Ref Range & Units 1 mo ago  Glucose 70 - 110 mg/dL 151 High    Sodium 136 - 145 mmol/L 138   Potassium 3.6 - 5.1 mmol/L 3.9   Chloride 97 - 109 mmol/L 103   Carbon Dioxide (CO2) 22.0 - 32.0 mmol/L 26.4   Urea Nitrogen (BUN) 7 - 25 mg/dL 14   Creatinine 0.7 - 1.3 mg/dL 0.9   Glomerular Filtration Rate (eGFR), MDRD Estimate >60  mL/min/1.73sq m 86   Calcium 8.7 - 10.3 mg/dL 9.7   AST  8 - 39 U/L 14   ALT  6 - 57 U/L 21   Alk Phos (alkaline Phosphatase) 34 - 104 U/L 62   Albumin 3.5 - 4.8 g/dL 4.4   Bilirubin, Total 0.3 - 1.2 mg/dL 0.5   Protein, Total 6.1 - 7.9 g/dL 7.1   A/G Ratio 1.0 - 5.0 gm/dL    Start Lamisil 250mg po QD with food. #30 0RF. D/C Atorvastatin while on treatment.   terbinafine (LAMISIL) 250 MG tablet - B/L foot   Lentigines - Scattered tan macules - Discussed due to sun exposure - Benign, observe - Call for any changes  Seborrheic Keratoses - Stuck-on, waxy, tan-brown papules and plaques  - Discussed benign etiology and prognosis. - Observe - Call for any changes  Melanocytic Nevi - Tan-brown and/or pink-flesh-colored symmetric macules and papules - Benign appearing on exam today - Observation - Call clinic for new or changing moles - Recommend daily use of broad spectrum spf 30+ sunscreen to sun-exposed areas.   Hemangiomas - Red papules - Discussed benign nature - Observe - Call for any changes  Actinic Damage - Chronic, secondary to cumulative UV/sun exposure - diffuse scaly erythematous macules with underlying dyspigmentation - Recommend daily broad spectrum sunscreen SPF 30+ to sun-exposed areas, reapply every 2 hours as needed.  - Call for new or changing lesions.  History of Dysplastic   Nevi - No evidence of recurrence today - Recommend regular full body skin exams - Recommend daily broad spectrum sunscreen SPF 30+ to sun-exposed areas, reapply every 2 hours as needed.  - Call if any new or changing lesions are noted between office visits  History of Melanoma IS - L cheek 05/21 MO[facility] with Dr. Manley Mason  - No evidence of recurrence today - No lymphadenopathy - Recommend regular full body skin exams - Recommend daily broad spectrum sunscreen SPF 30+ to sun-exposed areas, reapply every 2 hours as needed.  - Call if any new or changing lesions are noted between office  visits  Skin cancer screening performed today.  Return in about 1 month (around 11/22/2020) for Lamisil f/u .  Luther Redo, CMA, am acting as scribe for Sarina Ser, MD .  Documentation: I have reviewed the above documentation for accuracy and completeness, and I agree with the above.  Sarina Ser, MD

## 2020-10-28 ENCOUNTER — Encounter: Payer: Self-pay | Admitting: Dermatology

## 2020-11-17 ENCOUNTER — Other Ambulatory Visit: Payer: Self-pay | Admitting: Dermatology

## 2020-11-17 DIAGNOSIS — B353 Tinea pedis: Secondary | ICD-10-CM

## 2020-12-02 ENCOUNTER — Ambulatory Visit: Payer: 59 | Admitting: Dermatology

## 2020-12-03 ENCOUNTER — Other Ambulatory Visit: Payer: Self-pay

## 2020-12-03 ENCOUNTER — Ambulatory Visit: Payer: 59 | Admitting: Dermatology

## 2020-12-03 DIAGNOSIS — B351 Tinea unguium: Secondary | ICD-10-CM

## 2020-12-03 DIAGNOSIS — B353 Tinea pedis: Secondary | ICD-10-CM | POA: Diagnosis not present

## 2020-12-03 MED ORDER — TERBINAFINE HCL 250 MG PO TABS: 250.0000 mg | ORAL_TABLET | Freq: Every day | ORAL | 1 refills | Status: DC

## 2020-12-03 NOTE — Progress Notes (Signed)
   Follow-Up Visit   Subjective  Hector Garcia is a 65 y.o. male who presents for the following: tinea unguium and pedis (S/P one month of Lamisil 250mg  po QD - patient tolerated medication well with no side effects.).  The following portions of the chart were reviewed this encounter and updated as appropriate:   Tobacco  Allergies  Meds  Problems  Med Hx  Surg Hx  Fam Hx     Review of Systems:  No other skin or systemic complaints except as noted in HPI or Assessment and Plan.  Objective  Well appearing patient in no apparent distress; mood and affect are within normal limits.  A focused examination was performed including the feet. Relevant physical exam findings are noted in the Assessment and Plan.  Objective  B/L toenails: Toenail dystrophy   Objective  B/L feet: Scaling and maceration web spaces and over distal and lateral soles.   Assessment & Plan  Tinea unguium B/L toenails Chronic condition with expected duration over one year. Condition is bothersome to patient. Not currently at goal.  Restart Lamisil 250mg  po QD. #30 1RF. D/C cholesterol medication while on treatment. Advised patient nails may take a year to grow out.  terbinafine (LAMISIL) 250 MG tablet - B/L toenails  Tinea pedis of both feet Chronic condition with expected duration over one year. Condition is bothersome to patient. Not currently at goal. B/L feet Restart Lamisil 250mg  po QD. #30 1RF.   Other Related Medications terbinafine (LAMISIL) 250 MG tablet  Return in about 6 months (around 06/02/2021).  Luther Redo, CMA, am acting as scribe for Sarina Ser, MD .   Documentation: I have reviewed the above documentation for accuracy and completeness, and I agree with the above.  Sarina Ser, MD

## 2020-12-07 ENCOUNTER — Encounter: Payer: Self-pay | Admitting: Dermatology

## 2021-06-07 ENCOUNTER — Ambulatory Visit: Payer: 59 | Admitting: Dermatology

## 2021-06-07 ENCOUNTER — Other Ambulatory Visit: Payer: Self-pay

## 2021-06-07 ENCOUNTER — Encounter: Payer: Self-pay | Admitting: Dermatology

## 2021-06-07 DIAGNOSIS — L100 Pemphigus vulgaris: Secondary | ICD-10-CM

## 2021-06-07 DIAGNOSIS — Q828 Other specified congenital malformations of skin: Secondary | ICD-10-CM | POA: Diagnosis not present

## 2021-06-07 DIAGNOSIS — B353 Tinea pedis: Secondary | ICD-10-CM

## 2021-06-07 DIAGNOSIS — L570 Actinic keratosis: Secondary | ICD-10-CM | POA: Diagnosis not present

## 2021-06-07 DIAGNOSIS — L578 Other skin changes due to chronic exposure to nonionizing radiation: Secondary | ICD-10-CM

## 2021-06-07 DIAGNOSIS — B351 Tinea unguium: Secondary | ICD-10-CM

## 2021-06-07 DIAGNOSIS — Z85820 Personal history of malignant melanoma of skin: Secondary | ICD-10-CM

## 2021-06-07 DIAGNOSIS — L821 Other seborrheic keratosis: Secondary | ICD-10-CM

## 2021-06-07 MED ORDER — TERBINAFINE HCL 250 MG PO TABS
250.0000 mg | ORAL_TABLET | Freq: Every day | ORAL | 1 refills | Status: DC
Start: 2021-06-07 — End: 2022-01-13

## 2021-06-07 NOTE — Patient Instructions (Signed)
Actinic keratoses are precancerous spots that appear secondary to cumulative UV radiation exposure/sun exposure over time. They are chronic with expected duration over 1 year. A portion of actinic keratoses will progress to squamous cell carcinoma of the skin. It is not possible to reliably predict which spots will progress to skin cancer and so treatment is recommended to prevent development of skin cancer.  Recommend daily broad spectrum sunscreen SPF 30+ to sun-exposed areas, reapply every 2 hours as needed.  Recommend staying in the shade or wearing long sleeves, sun glasses (UVA+UVB protection) and wide brim hats (4-inch brim around the entire circumference of the hat). Call for new or changing lesions.   Cryotherapy Aftercare  Wash gently with soap and water everyday.   Apply Vaseline and Band-Aid daily until healed.   If you have any questions or concerns for your doctor, please call our main line at [phone] and press option 4 to reach your doctor's medical assistant. If no one answers, please leave a voicemail as directed and we will return your call as soon as possible. Messages left after 4 pm will be answered the following business day.   You may also send us a message via MyChart. We typically respond to MyChart messages within 1-2 business days.  For prescription refills, please ask your pharmacy to contact our office. Our fax number is [phone].  If you have an urgent issue when the clinic is closed that cannot wait until the next business day, you can page your doctor at the number below.    Please note that while we do our best to be available for urgent issues outside of office hours, we are not available 24/7.   If you have an urgent issue and are unable to reach us, you may choose to seek medical care at your doctor's office, retail clinic, urgent care center, or emergency room.  If you have a medical emergency, please immediately call 911 or go to the emergency  department.  Pager Numbers  - Dr. Kowalski: [phone]  - Dr. Moye: [phone]  - Dr. Stewart: [phone]  In the event of inclement weather, please call our main line at [phone] for an update on the status of any delays or closures.  Dermatology Medication Tips: Please keep the boxes that topical medications come in in order to help keep track of the instructions about where and how to use these. Pharmacies typically print the medication instructions only on the boxes and not directly on the medication tubes.   If your medication is too expensive, please contact our office at [phone] option 4 or send us a message through MyChart.   We are unable to tell what your co-pay for medications will be in advance as this is different depending on your insurance coverage. However, we may be able to find a substitute medication at lower cost or fill out paperwork to get insurance to cover a needed medication.   If a prior authorization is required to get your medication covered by your insurance company, please allow us 1-2 business days to complete this process.  Drug prices often vary depending on where the prescription is filled and some pharmacies may offer cheaper prices.  The website www.goodrx.com contains coupons for medications through different pharmacies. The prices here do not account for what the cost may be with help from insurance (it may be cheaper with your insurance), but the website can give you the price if you did not use any insurance.  - You   can print the associated coupon and take it with your prescription to the pharmacy.  - You may also stop by our office during regular business hours and pick up a GoodRx coupon card.  - If you need your prescription sent electronically to a different pharmacy, notify our office through [facility] MyChart or by phone at [phone] option 4.  

## 2021-06-07 NOTE — Progress Notes (Signed)
Follow-Up Visit   Subjective  Hector Garcia is a 66 y.o. male who presents for the following: 6 month follow up (Patient here today for 6 month follow up on toenails and feet. He was prescribed lamisil. He states toe still look about the same. ).  The following portions of the chart were reviewed this encounter and updated as appropriate:  Tobacco  Allergies  Meds  Problems  Med Hx  Surg Hx  Fam Hx     Objective  Well appearing patient in no apparent distress; mood and affect are within normal limits.  A focused examination was performed including bilateral feet, neck, face, ears, legs. Relevant physical exam findings are noted in the Assessment and Plan.  Right Hallux Toe Nail Plate Toenail dystrophy  Right Dorsum of Foot Still significant scale at bilateral feet   Right Ear x 1, right temple x 1 (2) Erythematous thin papules/macules with gritty scale.   left medial ankle Scaly macule keratotic border   Assessment & Plan  Tinea unguium Right Hallux Toe Nail Plate Chronic condition with expected duration over one year. Condition is bothersome to patient. Not currently at goal. Restart Lamisil '250mg'$  po QD. #30 1RF. D/C cholesterol medication while on treatment. Advised patient nails may take a year to grow out.  terbinafine (LAMISIL) 250 MG tablet - Right Hallux Toe Nail Plate Take 1 tablet (250 mg total) by mouth daily.  Tinea pedis of both feet Right Dorsum of Foot Chronic condition with expected duration over one year. Condition is bothersome to patient. Not currently at goal. Restart Lamisil '250mg'$  po QD. #30 1RF.   Related Medications terbinafine (LAMISIL) 250 MG tablet Take 1 tablet (250 mg total) by mouth daily.  Pemphigus vulgaris Skin Pemphigus vulgaris -persistent recurrent Recurs every time he quits smoking - patient has been off of Imuran for a while now, and being followed by [facility] dermatology.  Actinic keratosis (2) Right Ear x 1, right temple x  1 Actinic keratoses are precancerous spots that appear secondary to cumulative UV radiation exposure/sun exposure over time. They are chronic with expected duration over 1 year. A portion of actinic keratoses will progress to squamous cell carcinoma of the skin. It is not possible to reliably predict which spots will progress to skin cancer and so treatment is recommended to prevent development of skin cancer.  Recommend daily broad spectrum sunscreen SPF 30+ to sun-exposed areas, reapply every 2 hours as needed.  Recommend staying in the shade or wearing long sleeves, sun glasses (UVA+UVB protection) and wide brim hats (4-inch brim around the entire circumference of the hat). Call for new or changing lesions.  Destruction of lesion - Right Ear x 1, right temple x 1 Complexity: simple   Destruction method: cryotherapy   Informed consent: discussed and consent obtained   Timeout:  patient name, date of birth, surgical site, and procedure verified Lesion destroyed using liquid nitrogen: Yes   Region frozen until ice ball extended beyond lesion: Yes   Outcome: patient tolerated procedure well with no complications   Post-procedure details: wound care instructions given    Porokeratosis left medial ankle Benign-appearing.  Observation.  Call clinic for new or changing lesions.  Recommend daily use of broad spectrum spf 30+ sunscreen to sun-exposed areas.   Seborrheic Keratoses - Stuck-on, waxy, tan-brown papules and/or plaques  - Benign-appearing - Discussed benign etiology and prognosis. - Observe - Call for any changes  Actinic Damage - chronic, secondary to cumulative UV radiation exposure/sun  exposure over time - diffuse scaly erythematous macules with underlying dyspigmentation - Recommend daily broad spectrum sunscreen SPF 30+ to sun-exposed areas, reapply every 2 hours as needed.  - Recommend staying in the shade or wearing long sleeves, sun glasses (UVA+UVB protection) and wide  brim hats (4-inch brim around the entire circumference of the hat). - Call for new or changing lesions.  History of Melanoma - No evidence of recurrence today L cheek 05/21 MOHS with Dr. Manley Mason  - No lymphadenopathy - Recommend regular full body skin exams - Recommend daily broad spectrum sunscreen SPF 30+ to sun-exposed areas, reapply every 2 hours as needed.  - Call if any new or changing lesions are noted between office visits  Return in about 6 months (around 12/08/2021) for tinea pedis follow up. IRuthell Rummage, CMA, am acting as scribe for Sarina Ser, MD. Documentation: I have reviewed the above documentation for accuracy and completeness, and I agree with the above.  Sarina Ser, MD

## 2021-07-30 ENCOUNTER — Other Ambulatory Visit (INDEPENDENT_AMBULATORY_CARE_PROVIDER_SITE_OTHER): Payer: Self-pay | Admitting: Vascular Surgery

## 2021-07-30 DIAGNOSIS — I739 Peripheral vascular disease, unspecified: Secondary | ICD-10-CM

## 2021-08-01 ENCOUNTER — Other Ambulatory Visit: Payer: Self-pay | Admitting: Dermatology

## 2021-08-01 DIAGNOSIS — B351 Tinea unguium: Secondary | ICD-10-CM

## 2021-08-02 ENCOUNTER — Ambulatory Visit (INDEPENDENT_AMBULATORY_CARE_PROVIDER_SITE_OTHER): Payer: 59 | Admitting: Vascular Surgery

## 2021-08-02 ENCOUNTER — Other Ambulatory Visit: Payer: Self-pay

## 2021-08-02 ENCOUNTER — Ambulatory Visit (INDEPENDENT_AMBULATORY_CARE_PROVIDER_SITE_OTHER): Payer: 59

## 2021-08-02 VITALS — BP 132/74 | HR 62 | Ht 72.0 in | Wt 220.0 lb

## 2021-08-02 DIAGNOSIS — I70212 Atherosclerosis of native arteries of extremities with intermittent claudication, left leg: Secondary | ICD-10-CM

## 2021-08-02 DIAGNOSIS — I739 Peripheral vascular disease, unspecified: Secondary | ICD-10-CM | POA: Diagnosis not present

## 2021-08-02 DIAGNOSIS — I10 Essential (primary) hypertension: Secondary | ICD-10-CM

## 2021-08-02 DIAGNOSIS — Z955 Presence of coronary angioplasty implant and graft: Secondary | ICD-10-CM

## 2021-08-02 DIAGNOSIS — L100 Pemphigus vulgaris: Secondary | ICD-10-CM | POA: Insufficient documentation

## 2021-08-02 DIAGNOSIS — E782 Mixed hyperlipidemia: Secondary | ICD-10-CM

## 2021-08-02 DIAGNOSIS — E119 Type 2 diabetes mellitus without complications: Secondary | ICD-10-CM

## 2021-08-02 DIAGNOSIS — G2581 Restless legs syndrome: Secondary | ICD-10-CM | POA: Insufficient documentation

## 2021-08-02 DIAGNOSIS — Z720 Tobacco use: Secondary | ICD-10-CM | POA: Insufficient documentation

## 2021-08-03 ENCOUNTER — Encounter (INDEPENDENT_AMBULATORY_CARE_PROVIDER_SITE_OTHER): Payer: Self-pay

## 2021-08-03 ENCOUNTER — Telehealth (INDEPENDENT_AMBULATORY_CARE_PROVIDER_SITE_OTHER): Payer: Self-pay

## 2021-08-03 NOTE — Telephone Encounter (Signed)
I called and spoke to the pt an gave him his procedure date and instructions with Dr. Delana Meyer  for 08/13/21. The pt voiced understanding.

## 2021-08-04 ENCOUNTER — Encounter (INDEPENDENT_AMBULATORY_CARE_PROVIDER_SITE_OTHER): Payer: Self-pay | Admitting: Vascular Surgery

## 2021-08-04 DIAGNOSIS — I70219 Atherosclerosis of native arteries of extremities with intermittent claudication, unspecified extremity: Secondary | ICD-10-CM | POA: Insufficient documentation

## 2021-08-04 NOTE — H&P (View-Only) (Signed)
MRN : [id number]  Hector Garcia is a 65 y.o. ([birth year]) male who presents with chief complaint of left leg hurts.  History of Present Illness:   The patient is seen for evaluation of painful left lower extremity and diminished pulses. Patient notes the left leg pain is always associated with activity and is very consistent day today. Typically, the pain occurs at less than one block, progress is as activity continues to the point that the patient must stop walking. Resting including standing still for several minutes allowed resumption of the activity and the ability to walk a similar distance before stopping again. Uneven terrain and inclined shorten the distance. The pain has been progressive over the past several years. The patient states the inability to walk is now having a profound negative impact on quality of life and daily activities.  The patient denies rest pain or dangling of an extremity off the side of the bed during the night for relief. No open wounds or sores at this time. No prior interventions or surgeries.  No history of back problems or DJD of the lumbar sacral spine.   The patient denies changes in claudication symptoms or new rest pain symptoms.  No new ulcers or wounds of the foot.  The patient's blood pressure has been stable and relatively well controlled. The patient denies amaurosis fugax or recent TIA symptoms. There are no recent neurological changes noted. The patient denies history of DVT, PE or superficial thrombophlebitis. The patient denies recent episodes of angina or shortness of breath.   ABI's Rt=1.03 (triphasic) and Lt=0.63 (monophasic)   Current Meds  Medication Sig   amLODipine (NORVASC) 10 MG tablet Take 10 mg by mouth daily.    Ascorbic Acid (VITAMIN C) 1000 MG tablet Take 1,000 mg by mouth daily.   aspirin EC 81 MG tablet Take 81 mg by mouth at bedtime.   atorvastatin (LIPITOR) 20 MG tablet Take 20 mg by mouth at bedtime.    azaTHIOprine (IMURAN) 50 MG tablet Take 1 tablet by mouth daily.   docusate sodium (COLACE) 100 MG capsule Take 100 mg by mouth every evening.   doxazosin (CARDURA) 4 MG tablet Take 4 mg by mouth at bedtime.   empagliflozin (JARDIANCE) 25 MG TABS tablet Take 25 mg by mouth daily.   Flaxseed, Linseed, (BIO-FLAX) 1000 MG CAPS Take 1,000 mg by mouth daily.   glimepiride (AMARYL) 2 MG tablet Take 6 mg by mouth daily.    ketoconazole (NIZORAL) 2 % cream Apply 1 application topically at bedtime. qhs to feet   lisinopril-hydrochlorothiazide (ZESTORETIC) 20-25 MG tablet Take 1 tablet by mouth daily.   metFORMIN (GLUCOPHAGE) 500 MG tablet Take 500-1,000 mg by mouth See admin instructions. Take 1000 mg in the morning and 500 mg in the evening   metoprolol succinate (TOPROL-XL) 25 MG 24 hr tablet Take 0.5 tablets (12.5 mg total) by mouth daily.   niacin (NIASPAN) 500 MG CR tablet Take 500 mg by mouth at bedtime.   nitroGLYCERIN (NITROSTAT) 0.4 MG SL tablet Place 0.4 mg under the tongue every 5 (five) minutes as needed for chest pain.   Omega-3 Fatty Acids (FISH OIL) 1200 MG CAPS Take 1,200 mg by mouth daily.   omeprazole (PRILOSEC) 40 MG capsule Take 50 mg by mouth daily.    OVER THE COUNTER MEDICATION Apply 1 application topically daily. proclearz fungal shield   Phenylephrine-Pheniramine (DRISTAN NA) Place 1 spray into the nose daily as needed (allergies).   prasugrel (EFFIENT) 10  MG TABS tablet Take 1 tablet (10 mg total) by mouth daily.   predniSONE (DELTASONE) 10 MG tablet Take 10-12.5 mg by mouth See admin instructions. Take 12.5 mg every other day for 2 weeks, then 10 mg every other day for 2 weeks, 7.5 mg every other day for 2 weeks, 5 mg every other day for 2 weeks, 2.5 mg every other day for 2 weeks then stop   sildenafil (VIAGRA) 100 MG tablet Take by mouth.   terbinafine (LAMISIL) 250 MG tablet Take 1 tablet (250 mg total) by mouth daily.   terbinafine (LAMISIL) 250 MG tablet Take 1 tablet (250  mg total) by mouth daily.   TRULICITY 1.5 DV/7.6HY SOPN Inject into the skin.   zolpidem (AMBIEN) 10 MG tablet Take 5 mg by mouth at bedtime as needed for sleep.   [DISCONTINUED] amLODipine (NORVASC) 10 MG tablet Take 1 tablet by mouth daily.   [DISCONTINUED] omeprazole (PRILOSEC) 40 MG capsule Take 1 tablet by mouth daily.    Past Medical History:  Diagnosis Date   CAD (coronary artery disease)    Diabetes mellitus without complication ([place])    Hx of dysplastic nevus 11/28/2007   R lat abdomen ant side   Hx of dysplastic nevus 04/05/2016   R of midline sacral, mild   Hx of dysplastic nevus 07/02/2019   R sacral post waistline, severe atypia   Hx of dysplastic nevus 05/08/2019   L sacral post waistline, moderate atypia   Hx of melanoma in situ 04/09/2020   Left cheek, txted with MOHs Dr. Manley Mason   Hyperlipidemia    Hypertension    Pemphigus     Past Surgical History:  Procedure Laterality Date   CORONARY STENT INTERVENTION N/A 08/26/2019   Procedure: CORONARY STENT INTERVENTION;  Surgeon: Isaias Cowman, MD;  Location: [facility];  Service: Cardiovascular;  Laterality: N/A;   fisture surgery  1979   LEFT HEART CATH AND CORONARY ANGIOGRAPHY Left 08/26/2019   Procedure: LEFT HEART CATH AND CORONARY ANGIOGRAPHY;  Surgeon: Isaias Cowman, MD;  Location: [facility];  Service: Cardiovascular;  Laterality: Left;   ROTATOR CUFF REPAIR     TRIGGER FINGER RELEASE     x several    Social History Social History   Tobacco Use   Smoking status: Some Days    Packs/day: 0.50    Types: Cigarettes    Last attempt to quit: 11/2016    Years since quitting: 4.7   Smokeless tobacco: Never   Tobacco comments:    may smoke 1 pack a week  Vaping Use   Vaping Use: Never used  Substance Use Topics   Alcohol use: Yes    Alcohol/week: 1.0 standard drink    Types: 1 Cans of beer per week    Comment: 1 beer weekly   Drug use: No    Family History Family  History  Problem Relation Age of Onset   Benign prostatic hyperplasia Brother    Prostate cancer Neg Hx    Bladder Cancer Neg Hx    Kidney cancer Neg Hx     No Known Allergies   REVIEW OF SYSTEMS (Negative unless checked)  Constitutional: [] Weight loss  [] Fever  [] Chills Cardiac: [] Chest pain   [] Chest pressure   [] Palpitations   [] Shortness of breath when laying flat   [] Shortness of breath with exertion. Vascular:  [x] Pain in legs with walking   [] Pain in legs at rest  [] History of DVT   [] Phlebitis   []   Swelling in legs   [] Varicose veins   [] Non-healing ulcers Pulmonary:   [] Uses home oxygen   [] Productive cough   [] Hemoptysis   [] Wheeze  [] COPD   [] Asthma Neurologic:  [] Dizziness   [] Seizures   [] History of stroke   [] History of TIA  [] Aphasia   [] Vissual changes   [] Weakness or numbness in arm   [] Weakness or numbness in leg Musculoskeletal:   [] Joint swelling   [] Joint pain   [] Low back pain Hematologic:  [] Easy bruising  [] Easy bleeding   [] Hypercoagulable state   [] Anemic Gastrointestinal:  [] Diarrhea   [] Vomiting  [] Gastroesophageal reflux/heartburn   [] Difficulty swallowing. Genitourinary:  [] Chronic kidney disease   [] Difficult urination  [] Frequent urination   [] Blood in urine Skin:  [] Rashes   [] Ulcers  Psychological:  [] History of anxiety   []  History of major depression.  Physical Examination  Vitals:   08/02/21 1557  BP: 132/74  Pulse: 62  Weight: 220 lb (99.8 kg)  Height: 6' (1.829 m)   Body mass index is 29.84 kg/m. Gen: WD/WN, NAD Head: /AT, No temporalis wasting.  Ear/Nose/Throat: Hearing grossly intact, nares w/o erythema or drainage Eyes: PER, EOMI, sclera nonicteric.  Neck: Supple, no masses.  No bruit or JVD.  Pulmonary:  Good air movement, no audible wheezing, no use of accessory muscles.  Cardiac: RRR, normal S1, S2, no Murmurs. Vascular:   Vessel Right Left  Radial Palpable Palpable  PT Palpable Not palpable  DP Palpable Not palpable   Gastrointestinal: soft, non-distended. No guarding/no peritoneal signs.  Musculoskeletal: M/S 5/5 throughout.  No visible deformity.  Neurologic: CN 2-12 intact. Pain and light touch intact in extremities.  Symmetrical.  Speech is fluent. Motor exam as listed above. Psychiatric: Judgment intact, Mood & affect appropriate for pt's clinical situation. Dermatologic: No rashes or ulcers noted.  No changes consistent with cellulitis.   CBC Lab Results  Component Value Date   WBC 13.5 (H) 08/27/2019   HGB 13.0 08/27/2019   HCT 39.8 08/27/2019   MCV 88.6 08/27/2019   PLT 247 08/27/2019    BMET    Component Value Date/Time   NA 137 08/27/2019 0412   K 4.1 08/27/2019 0412   CL 105 08/27/2019 0412   CO2 23 08/27/2019 0412   GLUCOSE 179 (H) 08/27/2019 0412   BUN 16 08/27/2019 0412   CREATININE 0.74 08/27/2019 0412   CALCIUM 8.9 08/27/2019 0412   GFRNONAA >60 08/27/2019 0412   GFRAA >60 08/27/2019 0412   CrCl cannot be calculated (Patient's most recent lab result is older than the maximum 21 days allowed.).  COAG No results found for: INR, PROTIME  Radiology VAS [place] ABI WITH/WO TBI  Result Date: 08/02/2021  LOWER EXTREMITY DOPPLER STUDY Patient Name:  Hector Garcia  Date of Exam:   08/02/2021 Medical Rec #: [id number]           Accession #:    [id number] Date of Birth: Jun 23, 1956            Patient Gender: M Patient Age:   33 years Exam Location:  [facility] Procedure:      VAS [place] ABI WITH/WO TBI Referring Phys: [facility] --------------------------------------------------------------------------------  Indications: Claudication, and left shin.  Performing Technologist: Concha Norway RVT  Examination Guidelines: A complete evaluation includes at minimum, Doppler waveform signals and systolic blood pressure reading at the level of bilateral brachial, anterior tibial, and posterior tibial arteries, when vessel segments are accessible. Bilateral testing is considered an  integral  part of a complete examination. Photoelectric Plethysmograph (PPG) waveforms and toe systolic pressure readings are included as required and additional duplex testing as needed. Limited examinations for reoccurring indications may be performed as noted.  ABI Findings: +--------+------------------+-----+---------+--------+ Right   Rt Pressure (mmHg)IndexWaveform Comment  +--------+------------------+-----+---------+--------+ [id number]                                      +--------+------------------+-----+---------+--------+ ATA     154                    triphasic1.01     +--------+------------------+-----+---------+--------+ PTA     156               1.03 triphasic         +--------+------------------+-----+---------+--------+ +---------+------------------+-----+-------------------+-------+ Left     Lt Pressure (mmHg)IndexWaveform           Comment +---------+------------------+-----+-------------------+-------+ Brachial 152                                               +---------+------------------+-----+-------------------+-------+ ATA      84                     dampened monophasic.55     +---------+------------------+-----+-------------------+-------+ PTA      96                0.63 monophasic                 +---------+------------------+-----+-------------------+-------+ Great Toe54                0.36 Abnormal                   +---------+------------------+-----+-------------------+-------+  Summary: Right: Resting right ankle-brachial index is within normal range. No evidence of significant right lower extremity arterial disease. The right toe-brachial index is normal. Left: Resting left ankle-brachial index indicates moderate left lower extremity arterial disease. The left toe-brachial index is abnormal.  *See table(s) above for measurements and observations.  Electronically signed by Hortencia Pilar MD on 08/02/2021 at 5:34:55 PM.    Final       Assessment/Plan 1. Atherosclerosis of native artery of left lower extremity with intermittent claudication (HCC) Recommend:  The patient has experienced increased symptoms and is now describing lifestyle limiting claudication and mild rest pain.   Given the severity of the patient's left lower extremity symptoms the patient should undergo left lower extremity angiography and intervention.  Risk and benefits were reviewed the patient.  Indications for the procedure were reviewed.  All questions were answered, the patient agrees to proceed.   The patient should continue walking and begin a more formal exercise program.  The patient should continue antiplatelet therapy and aggressive treatment of the lipid abnormalities  The patient will follow up with me after the angiogram.    2. Benign essential hypertension Continue antihypertensive medications as already ordered, these medications have been reviewed and there are no changes at this time.   3. Controlled type 2 diabetes mellitus without complication, without long-term current use of insulin ([place]) Continue hypoglycemic medications as already ordered, these medications have been reviewed and there are no changes at this time.  Hgb A1C to be monitored as already arranged by primary service  4. Hyperlipidemia, mixed Continue statin as ordered and reviewed, no changes at this time   5. S/P drug eluting coronary stent placement Continue cardiac and antihypertensive medications as already ordered and reviewed, no changes at this time.  Continue statin as ordered and reviewed, no changes at this time  Nitrates PRN for chest pain     Hortencia Pilar, MD  08/04/2021 3:50 PM

## 2021-08-04 NOTE — Progress Notes (Signed)
MRN : [id number]  Hector Garcia is a 65 y.o. (May 17, 1956) male who presents with chief complaint of left leg hurts.  History of Present Illness:   The patient is seen for evaluation of painful left lower extremity and diminished pulses. Patient notes the left leg pain is always associated with activity and is very consistent day today. Typically, the pain occurs at less than one block, progress is as activity continues to the point that the patient must stop walking. Resting including standing still for several minutes allowed resumption of the activity and the ability to walk a similar distance before stopping again. Uneven terrain and inclined shorten the distance. The pain has been progressive over the past several years. The patient states the inability to walk is now having a profound negative impact on quality of life and daily activities.  The patient denies rest pain or dangling of an extremity off the side of the bed during the night for relief. No open wounds or sores at this time. No prior interventions or surgeries.  No history of back problems or DJD of the lumbar sacral spine.   The patient denies changes in claudication symptoms or new rest pain symptoms.  No new ulcers or wounds of the foot.  The patient's blood pressure has been stable and relatively well controlled. The patient denies amaurosis fugax or recent TIA symptoms. There are no recent neurological changes noted. The patient denies history of DVT, PE or superficial thrombophlebitis. The patient denies recent episodes of angina or shortness of breath.   ABI's Rt=1.03 (triphasic) and Lt=0.63 (monophasic)   Current Meds  Medication Sig   amLODipine (NORVASC) 10 MG tablet Take 10 mg by mouth daily.    Ascorbic Acid (VITAMIN C) 1000 MG tablet Take 1,000 mg by mouth daily.   aspirin EC 81 MG tablet Take 81 mg by mouth at bedtime.   atorvastatin (LIPITOR) 20 MG tablet Take 20 mg by mouth at bedtime.    azaTHIOprine (IMURAN) 50 MG tablet Take 1 tablet by mouth daily.   docusate sodium (COLACE) 100 MG capsule Take 100 mg by mouth every evening.   doxazosin (CARDURA) 4 MG tablet Take 4 mg by mouth at bedtime.   empagliflozin (JARDIANCE) 25 MG TABS tablet Take 25 mg by mouth daily.   Flaxseed, Linseed, (BIO-FLAX) 1000 MG CAPS Take 1,000 mg by mouth daily.   glimepiride (AMARYL) 2 MG tablet Take 6 mg by mouth daily.    ketoconazole (NIZORAL) 2 % cream Apply 1 application topically at bedtime. qhs to feet   lisinopril-hydrochlorothiazide (ZESTORETIC) 20-25 MG tablet Take 1 tablet by mouth daily.   metFORMIN (GLUCOPHAGE) 500 MG tablet Take 500-1,000 mg by mouth See admin instructions. Take 1000 mg in the morning and 500 mg in the evening   metoprolol succinate (TOPROL-XL) 25 MG 24 hr tablet Take 0.5 tablets (12.5 mg total) by mouth daily.   niacin (NIASPAN) 500 MG CR tablet Take 500 mg by mouth at bedtime.   nitroGLYCERIN (NITROSTAT) 0.4 MG SL tablet Place 0.4 mg under the tongue every 5 (five) minutes as needed for chest pain.   Omega-3 Fatty Acids (FISH OIL) 1200 MG CAPS Take 1,200 mg by mouth daily.   omeprazole (PRILOSEC) 40 MG capsule Take 50 mg by mouth daily.    OVER THE COUNTER MEDICATION Apply 1 application topically daily. proclearz fungal shield   Phenylephrine-Pheniramine (DRISTAN NA) Place 1 spray into the nose daily as needed (allergies).   prasugrel (EFFIENT) 10  MG TABS tablet Take 1 tablet (10 mg total) by mouth daily.   predniSONE (DELTASONE) 10 MG tablet Take 10-12.5 mg by mouth See admin instructions. Take 12.5 mg every other day for 2 weeks, then 10 mg every other day for 2 weeks, 7.5 mg every other day for 2 weeks, 5 mg every other day for 2 weeks, 2.5 mg every other day for 2 weeks then stop   sildenafil (VIAGRA) 100 MG tablet Take by mouth.   terbinafine (LAMISIL) 250 MG tablet Take 1 tablet (250 mg total) by mouth daily.   terbinafine (LAMISIL) 250 MG tablet Take 1 tablet (250  mg total) by mouth daily.   TRULICITY 1.5 NF/6.2ZH SOPN Inject into the skin.   zolpidem (AMBIEN) 10 MG tablet Take 5 mg by mouth at bedtime as needed for sleep.   [DISCONTINUED] amLODipine (NORVASC) 10 MG tablet Take 1 tablet by mouth daily.   [DISCONTINUED] omeprazole (PRILOSEC) 40 MG capsule Take 1 tablet by mouth daily.    Past Medical History:  Diagnosis Date   CAD (coronary artery disease)    Diabetes mellitus without complication ([place])    Hx of dysplastic nevus 11/28/2007   R lat abdomen ant side   Hx of dysplastic nevus 04/05/2016   R of midline sacral, mild   Hx of dysplastic nevus 07/02/2019   R sacral post waistline, severe atypia   Hx of dysplastic nevus 05/08/2019   L sacral post waistline, moderate atypia   Hx of melanoma in situ 04/09/2020   Left cheek, txted with MOHs Dr. Manley Mason   Hyperlipidemia    Hypertension    Pemphigus     Past Surgical History:  Procedure Laterality Date   CORONARY STENT INTERVENTION N/A 08/26/2019   Procedure: CORONARY STENT INTERVENTION;  Surgeon: Isaias Cowman, MD;  Location: [facility];  Service: Cardiovascular;  Laterality: N/A;   fisture surgery  1979   LEFT HEART CATH AND CORONARY ANGIOGRAPHY Left 08/26/2019   Procedure: LEFT HEART CATH AND CORONARY ANGIOGRAPHY;  Surgeon: Isaias Cowman, MD;  Location: [facility];  Service: Cardiovascular;  Laterality: Left;   ROTATOR CUFF REPAIR     TRIGGER FINGER RELEASE     x several    Social History Social History   Tobacco Use   Smoking status: Some Days    Packs/day: 0.50    Types: Cigarettes    Last attempt to quit: 11/2016    Years since quitting: 4.7   Smokeless tobacco: Never   Tobacco comments:    may smoke 1 pack a week  Vaping Use   Vaping Use: Never used  Substance Use Topics   Alcohol use: Yes    Alcohol/week: 1.0 standard drink    Types: 1 Cans of beer per week    Comment: 1 beer weekly   Drug use: No    Family History Family  History  Problem Relation Age of Onset   Benign prostatic hyperplasia Brother    Prostate cancer Neg Hx    Bladder Cancer Neg Hx    Kidney cancer Neg Hx     No Known Allergies   REVIEW OF SYSTEMS (Negative unless checked)  Constitutional: [] Weight loss  [] Fever  [] Chills Cardiac: [] Chest pain   [] Chest pressure   [] Palpitations   [] Shortness of breath when laying flat   [] Shortness of breath with exertion. Vascular:  [x] Pain in legs with walking   [] Pain in legs at rest  [] History of DVT   [] Phlebitis   []   Swelling in legs   [] Varicose veins   [] Non-healing ulcers Pulmonary:   [] Uses home oxygen   [] Productive cough   [] Hemoptysis   [] Wheeze  [] COPD   [] Asthma Neurologic:  [] Dizziness   [] Seizures   [] History of stroke   [] History of TIA  [] Aphasia   [] Vissual changes   [] Weakness or numbness in arm   [] Weakness or numbness in leg Musculoskeletal:   [] Joint swelling   [] Joint pain   [] Low back pain Hematologic:  [] Easy bruising  [] Easy bleeding   [] Hypercoagulable state   [] Anemic Gastrointestinal:  [] Diarrhea   [] Vomiting  [] Gastroesophageal reflux/heartburn   [] Difficulty swallowing. Genitourinary:  [] Chronic kidney disease   [] Difficult urination  [] Frequent urination   [] Blood in urine Skin:  [] Rashes   [] Ulcers  Psychological:  [] History of anxiety   []  History of major depression.  Physical Examination  Vitals:   08/02/21 1557  BP: 132/74  Pulse: 62  Weight: 220 lb (99.8 kg)  Height: 6' (1.829 m)   Body mass index is 29.84 kg/m. Gen: WD/WN, NAD Head: [place]/AT, No temporalis wasting.  Ear/Nose/Throat: Hearing grossly intact, nares w/o erythema or drainage Eyes: PER, EOMI, sclera nonicteric.  Neck: Supple, no masses.  No bruit or JVD.  Pulmonary:  Good air movement, no audible wheezing, no use of accessory muscles.  Cardiac: RRR, normal S1, S2, no Murmurs. Vascular:   Vessel Right Left  Radial Palpable Palpable  PT Palpable Not palpable  DP Palpable Not palpable   Gastrointestinal: soft, non-distended. No guarding/no peritoneal signs.  Musculoskeletal: M/S 5/5 throughout.  No visible deformity.  Neurologic: CN 2-12 intact. Pain and light touch intact in extremities.  Symmetrical.  Speech is fluent. Motor exam as listed above. Psychiatric: Judgment intact, Mood & affect appropriate for pt's clinical situation. Dermatologic: No rashes or ulcers noted.  No changes consistent with cellulitis.   CBC Lab Results  Component Value Date   WBC 13.5 (H) 08/27/2019   HGB 13.0 08/27/2019   HCT 39.8 08/27/2019   MCV 88.6 08/27/2019   PLT 247 08/27/2019    BMET    Component Value Date/Time   NA 137 08/27/2019 0412   K 4.1 08/27/2019 0412   CL 105 08/27/2019 0412   CO2 23 08/27/2019 0412   GLUCOSE 179 (H) 08/27/2019 0412   BUN 16 08/27/2019 0412   CREATININE 0.74 08/27/2019 0412   CALCIUM 8.9 08/27/2019 0412   GFRNONAA >60 08/27/2019 0412   GFRAA >60 08/27/2019 0412   CrCl cannot be calculated (Patient's most recent lab result is older than the maximum 21 days allowed.).  COAG No results found for: INR, PROTIME  Radiology VAS [place] ABI WITH/WO TBI  Result Date: 08/02/2021  LOWER EXTREMITY DOPPLER STUDY Patient Name:  PHUC KLUTTZ  Date of Exam:   08/02/2021 Medical Rec #: [id number]           Accession #:    [id number] Date of Birth: Feb 10, 1956            Patient Gender: M Patient Age:   52 years Exam Location:  [facility] Procedure:      VAS [place] ABI WITH/WO TBI Referring Phys: [facility] --------------------------------------------------------------------------------  Indications: Claudication, and left shin.  Performing Technologist: Concha Norway RVT  Examination Guidelines: A complete evaluation includes at minimum, Doppler waveform signals and systolic blood pressure reading at the level of bilateral brachial, anterior tibial, and posterior tibial arteries, when vessel segments are accessible. Bilateral testing is considered an  integral  part of a complete examination. Photoelectric Plethysmograph (PPG) waveforms and toe systolic pressure readings are included as required and additional duplex testing as needed. Limited examinations for reoccurring indications may be performed as noted.  ABI Findings: +--------+------------------+-----+---------+--------+ Right   Rt Pressure (mmHg)IndexWaveform Comment  +--------+------------------+-----+---------+--------+ [id number]                                      +--------+------------------+-----+---------+--------+ ATA     154                    triphasic1.01     +--------+------------------+-----+---------+--------+ PTA     156               1.03 triphasic         +--------+------------------+-----+---------+--------+ +---------+------------------+-----+-------------------+-------+ Left     Lt Pressure (mmHg)IndexWaveform           Comment +---------+------------------+-----+-------------------+-------+ Brachial 152                                               +---------+------------------+-----+-------------------+-------+ ATA      84                     dampened monophasic.55     +---------+------------------+-----+-------------------+-------+ PTA      96                0.63 monophasic                 +---------+------------------+-----+-------------------+-------+ Great Toe54                0.36 Abnormal                   +---------+------------------+-----+-------------------+-------+  Summary: Right: Resting right ankle-brachial index is within normal range. No evidence of significant right lower extremity arterial disease. The right toe-brachial index is normal. Left: Resting left ankle-brachial index indicates moderate left lower extremity arterial disease. The left toe-brachial index is abnormal.  *See table(s) above for measurements and observations.  Electronically signed by Hortencia Pilar MD on 08/02/2021 at 5:34:55 PM.    Final       Assessment/Plan 1. Atherosclerosis of native artery of left lower extremity with intermittent claudication (HCC) Recommend:  The patient has experienced increased symptoms and is now describing lifestyle limiting claudication and mild rest pain.   Given the severity of the patient's left lower extremity symptoms the patient should undergo left lower extremity angiography and intervention.  Risk and benefits were reviewed the patient.  Indications for the procedure were reviewed.  All questions were answered, the patient agrees to proceed.   The patient should continue walking and begin a more formal exercise program.  The patient should continue antiplatelet therapy and aggressive treatment of the lipid abnormalities  The patient will follow up with me after the angiogram.    2. Benign essential hypertension Continue antihypertensive medications as already ordered, these medications have been reviewed and there are no changes at this time.   3. Controlled type 2 diabetes mellitus without complication, without long-term current use of insulin ([place]) Continue hypoglycemic medications as already ordered, these medications have been reviewed and there are no changes at this time.  Hgb A1C to be monitored as already arranged by primary service  4. Hyperlipidemia, mixed Continue statin as ordered and reviewed, no changes at this time   5. S/P drug eluting coronary stent placement Continue cardiac and antihypertensive medications as already ordered and reviewed, no changes at this time.  Continue statin as ordered and reviewed, no changes at this time  Nitrates PRN for chest pain     Hortencia Pilar, MD  08/04/2021 3:50 PM

## 2021-08-05 ENCOUNTER — Encounter (INDEPENDENT_AMBULATORY_CARE_PROVIDER_SITE_OTHER): Payer: Self-pay

## 2021-08-05 ENCOUNTER — Telehealth (INDEPENDENT_AMBULATORY_CARE_PROVIDER_SITE_OTHER): Payer: Self-pay

## 2021-08-05 NOTE — Telephone Encounter (Signed)
I called the pt an made him aware of his new scheduled procedure date of Oct 14 he voiced understanding.

## 2021-08-13 DIAGNOSIS — I70219 Atherosclerosis of native arteries of extremities with intermittent claudication, unspecified extremity: Secondary | ICD-10-CM

## 2021-08-27 ENCOUNTER — Other Ambulatory Visit (INDEPENDENT_AMBULATORY_CARE_PROVIDER_SITE_OTHER): Payer: Self-pay | Admitting: Nurse Practitioner

## 2021-08-27 ENCOUNTER — Other Ambulatory Visit: Payer: Self-pay

## 2021-08-27 ENCOUNTER — Ambulatory Visit
Admission: RE | Admit: 2021-08-27 | Discharge: 2021-08-27 | Disposition: A | Discharge status: Home / Self Care | Payer: 59 | Source: Ambulatory Visit | Attending: Vascular Surgery | Admitting: Vascular Surgery

## 2021-08-27 ENCOUNTER — Encounter
Admission: RE | Disposition: A | Discharge status: Home / Self Care | Payer: Self-pay | Source: Ambulatory Visit | Attending: Vascular Surgery

## 2021-08-27 DIAGNOSIS — Z7985 Long-term (current) use of injectable non-insulin antidiabetic drugs: Secondary | ICD-10-CM | POA: Insufficient documentation

## 2021-08-27 DIAGNOSIS — Z7984 Long term (current) use of oral hypoglycemic drugs: Secondary | ICD-10-CM | POA: Insufficient documentation

## 2021-08-27 DIAGNOSIS — E1151 Type 2 diabetes mellitus with diabetic peripheral angiopathy without gangrene: Secondary | ICD-10-CM | POA: Insufficient documentation

## 2021-08-27 DIAGNOSIS — I10 Essential (primary) hypertension: Secondary | ICD-10-CM | POA: Insufficient documentation

## 2021-08-27 DIAGNOSIS — I70222 Atherosclerosis of native arteries of extremities with rest pain, left leg: Secondary | ICD-10-CM | POA: Diagnosis not present

## 2021-08-27 DIAGNOSIS — E782 Mixed hyperlipidemia: Secondary | ICD-10-CM | POA: Insufficient documentation

## 2021-08-27 DIAGNOSIS — Z7982 Long term (current) use of aspirin: Secondary | ICD-10-CM | POA: Insufficient documentation

## 2021-08-27 DIAGNOSIS — Z955 Presence of coronary angioplasty implant and graft: Secondary | ICD-10-CM | POA: Diagnosis not present

## 2021-08-27 DIAGNOSIS — Z79899 Other long term (current) drug therapy: Secondary | ICD-10-CM | POA: Insufficient documentation

## 2021-08-27 DIAGNOSIS — Z87891 Personal history of nicotine dependence: Secondary | ICD-10-CM | POA: Insufficient documentation

## 2021-08-27 DIAGNOSIS — I70219 Atherosclerosis of native arteries of extremities with intermittent claudication, unspecified extremity: Secondary | ICD-10-CM

## 2021-08-27 HISTORY — PX: LOWER EXTREMITY ANGIOGRAPHY: CATH118251

## 2021-08-27 LAB — GLUCOSE, CAPILLARY: Glucose-Capillary: 174 mg/dL — ABNORMAL HIGH (ref 70–99)

## 2021-08-27 LAB — BUN: BUN: 15 mg/dL (ref 8–23)

## 2021-08-27 LAB — CREATININE, SERUM
Creatinine, Ser: 0.76 mg/dL (ref 0.61–1.24)
GFR, Estimated: >60 mL/min (ref >60)

## 2021-08-27 SURGERY — LOWER EXTREMITY ANGIOGRAPHY
Anesthesia: Moderate Sedation | Site: Leg Lower | Laterality: Left

## 2021-08-27 MED ORDER — METHYLPREDNISOLONE SODIUM SUCC 125 MG IJ SOLR: 125.0000 mg | Freq: Once | INTRAMUSCULAR | Status: DC | PRN

## 2021-08-27 MED ORDER — HYDROMORPHONE HCL 1 MG/ML IJ SOLN: 1.0000 mg | Freq: Once | INTRAMUSCULAR | Status: DC | PRN

## 2021-08-27 MED ORDER — CLOPIDOGREL BISULFATE 300 MG PO TABS
300.0000 mg | ORAL_TABLET | ORAL | Status: AC
  Administered 2021-08-27: 300 mg via ORAL

## 2021-08-27 MED ORDER — MIDAZOLAM HCL 2 MG/2ML IJ SOLN
INTRAMUSCULAR | Status: DC | PRN
  Administered 2021-08-27: 2 mg via INTRAVENOUS
  Administered 2021-08-27 (×3): 1 mg via INTRAVENOUS

## 2021-08-27 MED ORDER — FENTANYL CITRATE PF 50 MCG/ML IJ SOSY
PREFILLED_SYRINGE | INTRAMUSCULAR | Status: AC
  Filled 2021-08-27: qty 1

## 2021-08-27 MED ORDER — CLOPIDOGREL BISULFATE 75 MG PO TABS: 75.0000 mg | ORAL_TABLET | Freq: Every day | ORAL | 4 refills | Status: DC

## 2021-08-27 MED ORDER — FAMOTIDINE 20 MG PO TABS: 40.0000 mg | ORAL_TABLET | Freq: Once | ORAL | Status: DC | PRN

## 2021-08-27 MED ORDER — FENTANYL CITRATE (PF) 100 MCG/2ML IJ SOLN
INTRAMUSCULAR | Status: DC | PRN
  Administered 2021-08-27: 50 ug via INTRAVENOUS
  Administered 2021-08-27: 25 ug via INTRAVENOUS
  Administered 2021-08-27: 50 ug via INTRAVENOUS
  Administered 2021-08-27: 25 ug via INTRAVENOUS

## 2021-08-27 MED ORDER — FENTANYL CITRATE PF 50 MCG/ML IJ SOSY
PREFILLED_SYRINGE | INTRAMUSCULAR | Status: AC
  Filled 2021-08-27: qty 2

## 2021-08-27 MED ORDER — HEPARIN SODIUM (PORCINE) 1000 UNIT/ML IJ SOLN
INTRAMUSCULAR | Status: AC
  Filled 2021-08-27: qty 1

## 2021-08-27 MED ORDER — MORPHINE SULFATE (PF) 4 MG/ML IV SOLN: 2.0000 mg | INTRAVENOUS | Status: DC | PRN

## 2021-08-27 MED ORDER — ONDANSETRON HCL 4 MG/2ML IJ SOLN: 4.0000 mg | Freq: Four times a day (QID) | INTRAMUSCULAR | Status: DC | PRN

## 2021-08-27 MED ORDER — IODIXANOL 320 MG/ML IV SOLN
INTRAVENOUS | Status: DC | PRN
  Administered 2021-08-27: 110 mL via INTRA_ARTERIAL

## 2021-08-27 MED ORDER — MIDAZOLAM HCL 2 MG/ML PO SYRP: 8.0000 mg | ORAL_SOLUTION | Freq: Once | ORAL | Status: DC | PRN

## 2021-08-27 MED ORDER — CLOPIDOGREL BISULFATE 75 MG PO TABS
ORAL_TABLET | ORAL | Status: AC
  Filled 2021-08-27: qty 4

## 2021-08-27 MED ORDER — SODIUM CHLORIDE 0.9 % IV SOLN: INTRAVENOUS | Status: DC

## 2021-08-27 MED ORDER — CEFAZOLIN SODIUM-DEXTROSE 2-4 GM/100ML-% IV SOLN
2.0000 g | Freq: Once | INTRAVENOUS | Status: AC
  Administered 2021-08-27: 2 g via INTRAVENOUS

## 2021-08-27 MED ORDER — DIPHENHYDRAMINE HCL 50 MG/ML IJ SOLN: 50.0000 mg | Freq: Once | INTRAMUSCULAR | Status: DC | PRN

## 2021-08-27 MED ORDER — HEPARIN SODIUM (PORCINE) 1000 UNIT/ML IJ SOLN
INTRAMUSCULAR | Status: DC | PRN
  Administered 2021-08-27: 5000 [IU] via INTRAVENOUS

## 2021-08-27 MED ORDER — MIDAZOLAM HCL 5 MG/5ML IJ SOLN
INTRAMUSCULAR | Status: AC
  Filled 2021-08-27: qty 5

## 2021-08-27 SURGICAL SUPPLY — 33 items
BALLN LUTONIX 6X150X130 (BALLOONS) ×2
BALLN LUTONIX 6X220X130 (BALLOONS) ×2
BALLN LUTONIX DCB 6X80X130 (BALLOONS) ×2
BALLN ULTRASCORE 014 2X100X150 (BALLOONS) ×2
BALLN ULTRASCORE 4X150X130 (BALLOONS) ×2
BALLOON LUTONIX 6X150X130 (BALLOONS) ×1 IMPLANT
BALLOON LUTONIX 6X220X130 (BALLOONS) ×1 IMPLANT
BALLOON LUTONIX DCB 6X80X130 (BALLOONS) ×1 IMPLANT
BALLOON ULTRASCORE 4X150X130 (BALLOONS) ×1 IMPLANT
BALLOON ULTRSCRE 014 2X100X150 (BALLOONS) ×1 IMPLANT
CANNULA 5F STIFF (CANNULA) ×2 IMPLANT
CATH ANGIO 5F PIGTAIL 65CM (CATHETERS) ×2 IMPLANT
CATH CROSSER 14S OTW 146CM (CATHETERS) ×2 IMPLANT
CATH NAVICROSS ANGLED 135CM (MICROCATHETER) ×2 IMPLANT
CATH SKICK ANG 70CM (CATHETERS) ×2 IMPLANT
CATH TEMPO 5F RIM 65CM (CATHETERS) ×2 IMPLANT
CATH VERT 5X100 (CATHETERS) ×2 IMPLANT
COVER PROBE U/S 5X48 (MISCELLANEOUS) ×2 IMPLANT
DEVICE SAFEGUARD 24CM (GAUZE/BANDAGES/DRESSINGS) ×2 IMPLANT
DEVICE STARCLOSE SE CLOSURE (Vascular Products) ×2 IMPLANT
DEVICE TORQUE (MISCELLANEOUS) ×2 IMPLANT
GLIDEWIRE ADV .035X260CM (WIRE) ×2 IMPLANT
KIT ENCORE 26 ADVANTAGE (KITS) ×2 IMPLANT
KIT FLOWMATE PROCEDURAL (KITS) ×2 IMPLANT
LIFESTENT SOLO 7X200X135 (Permanent Stent) ×4 IMPLANT
PACK ANGIOGRAPHY (CUSTOM PROCEDURE TRAY) ×2 IMPLANT
SHEATH BRITE TIP 5FRX11 (SHEATH) ×2 IMPLANT
SHEATH FLEXOR ANSEL2 7FRX45 (SHEATH) ×2 IMPLANT
SYR MEDRAD MARK 7 150ML (SYRINGE) ×2 IMPLANT
TUBING CONTRAST HIGH PRESS 72 (TUBING) ×2 IMPLANT
WIRE GUIDERIGHT .035X150 (WIRE) ×2 IMPLANT
WIRE MAGIC TORQUE 315CM (WIRE) ×2 IMPLANT
WIRE RUNTHROUGH .014X300CM (WIRE) ×2 IMPLANT

## 2021-08-27 NOTE — Interval H&P Note (Signed)
History and Physical Interval Note:  08/27/2021 10:38 AM  Hector Garcia  has presented today for surgery, with the diagnosis of LLE Angio  BARD   ASO w claudication.  The various methods of treatment have been discussed with the patient and family. After consideration of risks, benefits and other options for treatment, the patient has consented to  Procedure(s): LOWER EXTREMITY ANGIOGRAPHY (Left) as a surgical intervention.  The patient's history has been reviewed, patient examined, no change in status, stable for surgery.  I have reviewed the patient's chart and labs.  Questions were answered to the patient's satisfaction.     Hortencia Pilar

## 2021-09-06 NOTE — Op Note (Addendum)
[facility]  Percutaneous Study/Intervention Procedural Note   Date of Surgery: 08/27/2021  Surgeon:  Katha Cabal, MD.  Pre-operative Diagnosis: Atherosclerotic occlusive disease bilateral lower extremities with lifestyle limiting claudication and mild rest pain of the left lower extremity.  Post-operative diagnosis:  Same  Procedure(s) Performed:             1.  Introduction catheter into left lower extremity 3rd order catheter placement              2.    Contrast injection left lower extremity for distal runoff             3.  Crosser atherectomy left superficial femoral and popliteal artery.               4.  Percutaneous transluminal angioplasty and stent placement left superficial femoral artery and popliteal.             5.  Percutaneous transluminal angioplasty of the left posterior tibial artery to 2 mm.              6.  Star close closure right common femoral arteriotomy  Anesthesia: Conscious sedation was administered under my direct supervision by the interventional radiology RN. IV Versed plus fentanyl were utilized. Continuous ECG, pulse oximetry and blood pressure was monitored throughout the entire procedure.  Conscious sedation was for a total of 84 minutes and 2 seconds.  Sheath: 7 [place] Ansell right common femoral retrograde  Contrast: 110 cc  Fluoroscopy Time: 13.6 minutes  Indications:  Hector Garcia presents with severe atherosclerotic changes of his left lower extremity.  He is voicing both lifestyle limitation as well as mild rest pain symptoms.  His physical examination as well as noninvasive studies demonstrate severe atherosclerotic disease.  This places him at increased risk for limb loss.  The risks and benefits are reviewed all questions answered patient agrees to proceed.  Procedure:  Hector Garcia is a 65 y.o. y.o. male who was identified and appropriate procedural time out was performed.  The patient was then  placed supine on the table and prepped and draped in the usual sterile fashion.    Ultrasound was placed in the sterile sleeve and the right groin was evaluated the right common femoral artery was echolucent and pulsatile indicating patency.  Image was recorded for the permanent record and under real-time visualization a microneedle was inserted into the common femoral artery microwire followed by a micro-sheath.  A J-wire was then advanced through the micro-sheath and a  5 [place] sheath was then inserted over a J-wire. J-wire was then advanced and a 5 French pigtail catheter was positioned at the level of T12. AP projection of the aorta was then obtained. Pigtail catheter was repositioned to above the bifurcation and a RAO view of the pelvis was obtained.  Subsequently a pigtail catheter with the stiff angle Glidewire was used to cross the aortic bifurcation the catheter wire were advanced down into the left distal external iliac artery. Oblique view of the femoral bifurcation was then obtained and subsequently the wire was reintroduced and the pigtail catheter negotiated into the SFA representing third order catheter placement. Distal runoff was then performed.  Diagnostic interpretation: The abdominal aorta is opacified with a bolus injection contrast.  It is mildly diseased but there are no hemodynamically significant stenoses.  The aortic bifurcation bilateral common and external iliac arteries demonstrate mild atherosclerotic changes but there are no hemodynamically significant stenoses.  The internal iliac arteries are patent bilaterally.  The left common femoral profunda femoris demonstrate atherosclerotic changes but there are no hemodynamically significant lesions.  The superficial femoral artery demonstrates severe atherosclerotic changes in its proximal half with multiple lesions greater than 70%.  In the mid superficial femoral artery there is an occlusion with extends down to just below the  level of Hunter's canal.  This is over 15 cm.  The mid and distal popliteal artery is diffusely diseased but there are no hemodynamically significant stenoses.  Trifurcation demonstrates increasing disease the anterior tibial is occluded shortly after its origin.  The tibioperoneal trunk demonstrates atherosclerotic changes but there are no hemodynamically significant stenoses.  The posterior tibial demonstrates a greater than 80% stenosis in its proximal one third that extends over 10 to 15 mm in length and in its midportion there is a greater than 90% stenosis in the posterior tibial that extends over 10 to 15 mm.  Distal to this lesion the posterior tibial appears to have a good caliber and fills the plantar vessels and the pedal arch.  The peroneal occludes distally.  Posterior tibial is the dominant runoff to the foot.  5000 units of heparin was then given and allowed to circulate and a 7 [place] Ansell sheath was advanced up and over the bifurcation and positioned in the femoral artery.  The crosser atherectomy 14 S device is then prepped on the field and positioned with the tip in the cul-de-sac of the SFA.  Crosser atherectomy catheter is then used to cross the SFA popliteal occlusion.  Intraluminal positioning is verified by hand-injection.  A 0.014 run-through wire was then advanced through the Crosser down into the tibial vessels.  Next a 4 mm x 150 mm ultra score balloon is advanced across the occlusion 2 separate inflations were made second more proximally.  Both inflations were to 8 atm for 1 minute.  Follow-up imaging demonstrates greater than 60% residual stenosis throughout multiple portions of the lesion and a 7 mm x 200 mm life stent is deployed followed by a second 7 mm x 200 mm life stent.  The stents were then postdilated using a 6 mm x 220 mm Lutonix drug-eluting balloon inflated to 8 to 10 atm for approximately 1 minute.  Next a 6 mm x 150 mm Lutonix drug-eluting balloon is advanced  across the lesion and inflated to 8 to 10 atm for approximately 1 minute.  Lastly a 6 mm x 80 mm Lutonix drug-eluting balloon is used to treat the stented segment again the inflation is to 8 to 10 atm for 1 minute.  Follow-up imaging now demonstrates wide patency of the SFA and popliteal with less than 10% residual stenosis.  The detector was then repositioned down to the below the knee and the wire was negotiated to the posterior tibial.  Once both lesions have been crossed a 2 mm x 100 mm balloon is advanced inflation is to 8 atm for the first inflation for 1 minute.  The balloon is then repositioned and a second inflation was performed at 10 atm for 1 minute.  Follow-up imaging now demonstrates wide patency of the posterior tibial with preservation of the distal runoff.  After review of these images the sheath is pulled into the right external iliac oblique of the common femoral is obtained and a Star close device deployed. There no immediate complications.   Findings:   The abdominal aorta is opacified with a bolus injection contrast.  It is mildly  diseased but there are no hemodynamically significant stenoses.  The aortic bifurcation bilateral common and external iliac arteries demonstrate mild atherosclerotic changes but there are no hemodynamically significant stenoses.  The internal iliac arteries are patent bilaterally.  The left common femoral profunda femoris demonstrate atherosclerotic changes but there are no hemodynamically significant lesions.  The superficial femoral artery demonstrates severe atherosclerotic changes in its proximal half with multiple lesions greater than 70%.  In the mid superficial femoral artery there is an occlusion with extends down to just below the level of Hunter's canal.  This is over 15 cm.  The mid and distal popliteal artery is diffusely diseased but there are no hemodynamically significant stenoses.  Trifurcation demonstrates increasing disease the anterior  tibial is occluded shortly after its origin.  The tibioperoneal trunk demonstrates atherosclerotic changes but there are no hemodynamically significant stenoses.  The posterior tibial demonstrates a greater than 80% stenosis in its proximal one third that extends over 10 to 15 mm in length and in its midportion there is a greater than 90% stenosis in the posterior tibial that extends over 10 to 15 mm.  Distal to this lesion the posterior tibial appears to have a good caliber and fills the plantar vessels and the pedal arch.  The peroneal occludes distally.  Posterior tibial is the dominant runoff to the foot.  Following crosser atherectomy there is now recanalization of the SFA and popliteal.  Following angioplasty to 4 mm there is greater than 60% residual stenosis and therefore life stents are deployed as described above postdilated with 6 mm balloons and there is now less than 10% residual stenosis with wide patency of the SFA popliteal.  Following angioplasty posterior tibial now is in-line flow and looks quite nice. Angioplasty of the posterior tibial yields an excellent result with less than 10% residual stenosis.    Summary: Successful recanalization left lower extremity for limb salvage                        Disposition: Patient was taken to the recovery room in stable condition having tolerated the procedure well.  Hector Garcia, Dolores Lory 08/27/2021, 12:55 PM

## 2021-09-08 ENCOUNTER — Encounter: Payer: Self-pay | Admitting: Vascular Surgery

## 2021-09-16 ENCOUNTER — Ambulatory Visit (INDEPENDENT_AMBULATORY_CARE_PROVIDER_SITE_OTHER): Payer: 59 | Admitting: Nurse Practitioner

## 2021-09-16 ENCOUNTER — Encounter (INDEPENDENT_AMBULATORY_CARE_PROVIDER_SITE_OTHER): Payer: 59

## 2021-09-20 DIAGNOSIS — I739 Peripheral vascular disease, unspecified: Secondary | ICD-10-CM | POA: Insufficient documentation

## 2021-09-30 ENCOUNTER — Other Ambulatory Visit (INDEPENDENT_AMBULATORY_CARE_PROVIDER_SITE_OTHER): Payer: Self-pay | Admitting: Vascular Surgery

## 2021-09-30 DIAGNOSIS — I70212 Atherosclerosis of native arteries of extremities with intermittent claudication, left leg: Secondary | ICD-10-CM

## 2021-09-30 DIAGNOSIS — Z95820 Peripheral vascular angioplasty status with implants and grafts: Secondary | ICD-10-CM

## 2021-10-01 ENCOUNTER — Ambulatory Visit (INDEPENDENT_AMBULATORY_CARE_PROVIDER_SITE_OTHER): Payer: 59 | Admitting: Nurse Practitioner

## 2021-10-01 ENCOUNTER — Ambulatory Visit (INDEPENDENT_AMBULATORY_CARE_PROVIDER_SITE_OTHER): Payer: 59

## 2021-10-01 ENCOUNTER — Other Ambulatory Visit: Payer: Self-pay

## 2021-10-01 VITALS — BP 167/82 | HR 82 | Wt 210.0 lb

## 2021-10-01 DIAGNOSIS — I70212 Atherosclerosis of native arteries of extremities with intermittent claudication, left leg: Secondary | ICD-10-CM

## 2021-10-01 DIAGNOSIS — E782 Mixed hyperlipidemia: Secondary | ICD-10-CM

## 2021-10-01 DIAGNOSIS — I739 Peripheral vascular disease, unspecified: Secondary | ICD-10-CM | POA: Diagnosis not present

## 2021-10-01 DIAGNOSIS — E119 Type 2 diabetes mellitus without complications: Secondary | ICD-10-CM | POA: Diagnosis not present

## 2021-10-01 DIAGNOSIS — Z720 Tobacco use: Secondary | ICD-10-CM | POA: Diagnosis not present

## 2021-10-01 DIAGNOSIS — Z95820 Peripheral vascular angioplasty status with implants and grafts: Secondary | ICD-10-CM

## 2021-10-04 ENCOUNTER — Encounter (INDEPENDENT_AMBULATORY_CARE_PROVIDER_SITE_OTHER): Payer: Self-pay | Admitting: Nurse Practitioner

## 2021-10-04 NOTE — Progress Notes (Signed)
Subjective:    Patient ID: Hector Garcia, male    DOB: 01-May-1956, 65 y.o.   MRN: [id number] Chief Complaint  Patient presents with   Follow-up    2wk armc pose le angio     Hector Garcia is a 65 year old male that returns to the office for followup and review status post angiogram with intervention. The patient notes improvement in the lower extremity symptoms. No interval shortening of the patient's claudication distance or rest pain symptoms. Previous wounds have now healed.  No new ulcers or wounds have occurred since the last visit.  There have been no significant changes to the patient's overall health care.  The patient denies amaurosis fugax or recent TIA symptoms. There are no recent neurological changes noted. The patient denies history of DVT, PE or superficial thrombophlebitis. The patient denies recent episodes of angina or shortness of breath.   ABI's Rt=1.06 and Lt=1.04  (previous ABI's Rt=1.03 and Lt=0.63) Duplex US of the bilateral tibial arteries shows biphasic waveforms with good toe waveforms bilaterally   Review of Systems  Cardiovascular:  Negative for leg swelling.  All other systems reviewed and are negative.     Objective:   Physical Exam Vitals reviewed.  HENT:     Head: Normocephalic.  Cardiovascular:     Rate and Rhythm: Normal rate and regular rhythm.     Pulses: Normal pulses.  Pulmonary:     Effort: Pulmonary effort is normal.  Musculoskeletal:     Right lower leg: No edema.     Left lower leg: No edema.  Skin:    General: Skin is warm and dry.  Neurological:     Mental Status: He is alert and oriented to person, place, and time.  Psychiatric:        Mood and Affect: Mood normal.        Behavior: Behavior normal.        Thought Content: Thought content normal.        Judgment: Judgment normal.    BP (!) 167/82   Pulse 82   Wt 210 lb (95.3 kg)   BMI 28.48 kg/m   Past Medical History:  Diagnosis Date   CAD (coronary  artery disease)    Diabetes mellitus without complication (HCC)    Hx of dysplastic nevus 11/28/2007   R lat abdomen ant side   Hx of dysplastic nevus 04/05/2016   R of midline sacral, mild   Hx of dysplastic nevus 07/02/2019   R sacral post waistline, severe atypia   Hx of dysplastic nevus 05/08/2019   L sacral post waistline, moderate atypia   Hx of melanoma in situ 04/09/2020   Left cheek, txted with MOHs Dr. Manley Mason   Hyperlipidemia    Hypertension    Pemphigus     Social History   Socioeconomic History   Marital status: Divorced    Spouse name: Not on file   Number of children: Not on file   Years of education: Not on file   Highest education level: Not on file  Occupational History   Not on file  Tobacco Use   Smoking status: Some Days    Packs/day: 0.50    Types: Cigarettes    Last attempt to quit: 11/2016    Years since quitting: 4.8   Smokeless tobacco: Never   Tobacco comments:    may smoke 1 pack a week  Vaping Use   Vaping Use: Never used  Substance and Sexual Activity  Alcohol use: Yes    Alcohol/week: 1.0 standard drink    Types: 1 Cans of beer per week    Comment: 1 beer weekly   Drug use: No   Sexual activity: Not on file  Other Topics Concern   Not on file  Social History Narrative   Not on file   Social Determinants of Health   Financial Resource Strain: Not on file  Food Insecurity: Not on file  Transportation Needs: Not on file  Physical Activity: Not on file  Stress: Not on file  Social Connections: Not on file  Intimate Partner Violence: Not on file    Past Surgical History:  Procedure Laterality Date   CORONARY STENT INTERVENTION N/A 08/26/2019   Procedure: CORONARY STENT INTERVENTION;  Surgeon: Isaias Cowman, MD;  Location: [facility];  Service: Cardiovascular;  Laterality: N/A;   fisture surgery  1979   LEFT HEART CATH AND CORONARY ANGIOGRAPHY Left 08/26/2019   Procedure: LEFT HEART CATH AND CORONARY  ANGIOGRAPHY;  Surgeon: Isaias Cowman, MD;  Location: [facility];  Service: Cardiovascular;  Laterality: Left;   LOWER EXTREMITY ANGIOGRAPHY Left 08/27/2021   Procedure: LOWER EXTREMITY ANGIOGRAPHY;  Surgeon: Katha Cabal, MD;  Location: [facility];  Service: Cardiovascular;  Laterality: Left;   ROTATOR CUFF REPAIR     TRIGGER FINGER RELEASE     x several    Family History  Problem Relation Age of Onset   Benign prostatic hyperplasia Brother    Prostate cancer Neg Hx    Bladder Cancer Neg Hx    Kidney cancer Neg Hx     No Known Allergies  CBC Latest Ref Rng & Units 08/27/2019  WBC 4.0 - 10.5 K/uL 13.5(H)  Hemoglobin 13.0 - 17.0 g/dL 13.0  Hematocrit 39.0 - 52.0 % 39.8  Platelets 150 - 400 K/uL 247      CMP     Component Value Date/Time   NA 137 08/27/2019 0412   K 4.1 08/27/2019 0412   CL 105 08/27/2019 0412   CO2 23 08/27/2019 0412   GLUCOSE 179 (H) 08/27/2019 0412   BUN 15 08/27/2021 0917   CREATININE 0.76 08/27/2021 0917   CALCIUM 8.9 08/27/2019 0412   GFRNONAA >60 08/27/2021 0917   GFRAA >60 08/27/2019 0412     No results found.     Assessment & Plan:   1. PAD (peripheral artery disease) (HCC) Recommend:  The patient is status post successful angiogram with intervention.  The patient reports that the claudication symptoms and leg pain is essentially gone.   The patient denies lifestyle limiting changes at this point in time.  No further invasive studies, angiography or surgery at this time The patient should continue walking and begin a more formal exercise program.  The patient should continue antiplatelet therapy and aggressive treatment of the lipid abnormalities  Smoking cessation was again discussed  Patient should undergo noninvasive studies as ordered. The patient will follow up with me after the studies.    2. Hyperlipidemia, mixed Continue statin as ordered and reviewed, no changes at this time   3.  Controlled type 2 diabetes mellitus without complication, without long-term current use of insulin (HCC) Continue hypoglycemic medications as already ordered, these medications have been reviewed and there are no changes at this time.  Hgb A1C to be monitored as already arranged by primary service    Current Outpatient Medications on File Prior to Visit  Medication Sig Dispense Refill   amLODipine (  NORVASC) 10 MG tablet Take 10 mg by mouth daily.      aspirin EC 81 MG tablet Take 81 mg by mouth at bedtime.     clopidogrel (PLAVIX) 75 MG tablet Take 1 tablet (75 mg total) by mouth daily. 30 tablet 4   Flaxseed, Linseed, (BIO-FLAX) 1000 MG CAPS Take 1,000 mg by mouth daily.     glimepiride (AMARYL) 2 MG tablet Take 2 mg by mouth daily.     ketoconazole (NIZORAL) 2 % cream Apply 1 application topically at bedtime. qhs to feet 60 g 3   lisinopril-hydrochlorothiazide (ZESTORETIC) 20-25 MG tablet Take 1 tablet by mouth daily.     metFORMIN (GLUCOPHAGE) 500 MG tablet Take 500 mg by mouth 2 (two) times daily with a meal.     metoprolol succinate (TOPROL-XL) 25 MG 24 hr tablet Take 0.5 tablets (12.5 mg total) by mouth daily. (Patient taking differently: Take 25 mg by mouth daily.) 30 tablet 11   niacin (NIASPAN) 500 MG CR tablet Take 500 mg by mouth at bedtime.     nitroGLYCERIN (NITROSTAT) 0.4 MG SL tablet Place 0.4 mg under the tongue every 5 (five) minutes as needed for chest pain.     Omega-3 Fatty Acids (FISH OIL) 1000 MG CPDR Take 1,000 mg by mouth daily.     omeprazole (PRILOSEC) 40 MG capsule Take 40 mg by mouth daily.     Phenylephrine-Pheniramine (DRISTAN NA) Place 1 spray into the nose daily as needed (allergies).     rosuvastatin (CRESTOR) 20 MG tablet Take 20 mg by mouth at bedtime.     sildenafil (VIAGRA) 100 MG tablet Take 100 mg by mouth as needed for erectile dysfunction.     TRULICITY 1.5 HK/7.4QV SOPN Inject 1.5 mg into the skin once a week.     vitamin C (ASCORBIC ACID) 500 MG  tablet Take 500 mg by mouth daily.     zolpidem (AMBIEN) 10 MG tablet Take 5 mg by mouth at bedtime as needed for sleep.     terbinafine (LAMISIL) 250 MG tablet Take 1 tablet (250 mg total) by mouth daily. (Patient not taking: Reported on 08/23/2021) 30 tablet 0   terbinafine (LAMISIL) 250 MG tablet Take 1 tablet (250 mg total) by mouth daily. (Patient not taking: Reported on 08/23/2021) 30 tablet 1   No current facility-administered medications on file prior to visit.    There are no Patient Instructions on file for this visit. No follow-ups on file.   Kris Hartmann, NP

## 2021-11-22 ENCOUNTER — Other Ambulatory Visit (INDEPENDENT_AMBULATORY_CARE_PROVIDER_SITE_OTHER): Payer: Self-pay | Admitting: Vascular Surgery

## 2021-12-14 ENCOUNTER — Other Ambulatory Visit: Payer: Self-pay

## 2021-12-14 ENCOUNTER — Ambulatory Visit: Payer: 59 | Admitting: Dermatology

## 2021-12-14 DIAGNOSIS — B351 Tinea unguium: Secondary | ICD-10-CM | POA: Diagnosis not present

## 2021-12-14 DIAGNOSIS — L814 Other melanin hyperpigmentation: Secondary | ICD-10-CM

## 2021-12-14 DIAGNOSIS — Z86006 Personal history of melanoma in-situ: Secondary | ICD-10-CM | POA: Diagnosis not present

## 2021-12-14 DIAGNOSIS — Z1283 Encounter for screening for malignant neoplasm of skin: Secondary | ICD-10-CM

## 2021-12-14 DIAGNOSIS — L821 Other seborrheic keratosis: Secondary | ICD-10-CM

## 2021-12-14 DIAGNOSIS — L578 Other skin changes due to chronic exposure to nonionizing radiation: Secondary | ICD-10-CM

## 2021-12-14 DIAGNOSIS — D1800 Hemangioma unspecified site: Secondary | ICD-10-CM

## 2021-12-14 DIAGNOSIS — D229 Melanocytic nevi, unspecified: Secondary | ICD-10-CM

## 2021-12-14 MED ORDER — TERBINAFINE HCL 250 MG PO TABS
250.0000 mg | ORAL_TABLET | Freq: Every day | ORAL | 0 refills | Status: DC
Start: 2021-12-14 — End: 2022-01-13

## 2021-12-14 NOTE — Patient Instructions (Signed)

## 2021-12-14 NOTE — Progress Notes (Signed)
Follow-Up Visit   Subjective  Hector Garcia is a 66 y.o. male who presents for the following: Annual Exam (Mole check hx of melanoma in situ left cheek treated with Mohs surgery 04/09/20 by Dr Manley Mason, hx of Dysplastic nevus /The patient presents for Total-Body Skin Exam (TBSE) for skin cancer screening and mole check.  The patient has spots, moles and lesions to be evaluated, some may be new or changing and the patient has concerns that these could be cancer. ) and Follow-up (Recheck right foot-tinea unguium pt took 2 rounds of Lamisil tablet with a poor response ).  The following portions of the chart were reviewed this encounter and updated as appropriate:   Tobacco   Allergies   Meds   Problems   Med Hx   Surg Hx   Fam Hx      Review of Systems:  No other skin or systemic complaints except as noted in HPI or Assessment and Plan.  Objective  Well appearing patient in no apparent distress; mood and affect are within normal limits.  A full examination was performed including scalp, head, eyes, ears, nose, lips, neck, chest, axillae, abdomen, back, buttocks, bilateral upper extremities, bilateral lower extremities, hands, feet, fingers, toes, fingernails, and toenails. All findings within normal limits unless otherwise noted below.  Right Hallux Toe Nail Plate Persistent nail  dystrophy    Assessment & Plan  Tinea unguium Right Hallux Toe Nail Plate  Chronic condition with expected duration over one year. Condition is bothersome to patient. Not currently at goal. Restart Lamisil 250mg  po QD. #30 0RF. D/C cholesterol medication while on treatment. Advised patient nails may take a year to grow out.  Related Medications terbinafine (LAMISIL) 250 MG tablet Take 1 tablet (250 mg total) by mouth daily.  terbinafine (LAMISIL) 250 MG tablet Take 1 tablet (250 mg total) by mouth daily.  Lentigines - Scattered tan macules - Due to sun exposure - Benign-appearing, observe - Recommend  daily broad spectrum sunscreen SPF 30+ to sun-exposed areas, reapply every 2 hours as needed. - Call for any changes  Seborrheic Keratoses - Stuck-on, waxy, tan-brown papules and/or plaques  - Benign-appearing - Discussed benign etiology and prognosis. - Observe - Call for any changes  Melanocytic Nevi - Tan-brown and/or pink-flesh-colored symmetric macules and papules - Benign appearing on exam today - Observation - Call clinic for new or changing moles - Recommend daily use of broad spectrum spf 30+ sunscreen to sun-exposed areas.   Hemangiomas - Red papules - Discussed benign nature - Observe - Call for any changes  Actinic Damage - Chronic condition, secondary to cumulative UV/sun exposure - diffuse scaly erythematous macules with underlying dyspigmentation - Recommend daily broad spectrum sunscreen SPF 30+ to sun-exposed areas, reapply every 2 hours as needed.  - Staying in the shade or wearing long sleeves, sun glasses (UVA+UVB protection) and wide brim hats (4-inch brim around the entire circumference of the hat) are also recommended for sun protection.  - Call for new or changing lesions.  History of Dysplastic Nevi Multiple see history  - No evidence of recurrence today - Recommend regular full body skin exams - Recommend daily broad spectrum sunscreen SPF 30+ to sun-exposed areas, reapply every 2 hours as needed.  - Call if any new or changing lesions are noted between office visits   History of Melanoma in Situ - L cheek 05/21 MOHS with Dr. Shelda Jakes - No evidence of recurrence today - Recommend regular full body skin exams -  Recommend daily broad spectrum sunscreen SPF 30+ to sun-exposed areas, reapply every 2 hours as needed.  - Call if any new or changing lesions are noted between office visits   Skin cancer screening performed today.   Return in about 6 months (around 06/13/2022) for TBSE, Hx of Melanoma in situ.  Marta Lamas, CMA, am acting as scribe  for Sarina Ser, MD .  Documentation: I have reviewed the above documentation for accuracy and completeness, and I agree with the above.  Sarina Ser, MD

## 2021-12-16 ENCOUNTER — Encounter: Payer: Self-pay | Admitting: Dermatology

## 2021-12-20 ENCOUNTER — Ambulatory Visit: Payer: 59 | Admitting: Dermatology

## 2021-12-31 ENCOUNTER — Other Ambulatory Visit (INDEPENDENT_AMBULATORY_CARE_PROVIDER_SITE_OTHER): Payer: Self-pay | Admitting: Nurse Practitioner

## 2021-12-31 DIAGNOSIS — Z9889 Other specified postprocedural states: Secondary | ICD-10-CM

## 2021-12-31 DIAGNOSIS — I739 Peripheral vascular disease, unspecified: Secondary | ICD-10-CM

## 2022-01-03 ENCOUNTER — Ambulatory Visit (INDEPENDENT_AMBULATORY_CARE_PROVIDER_SITE_OTHER): Payer: 59 | Admitting: Nurse Practitioner

## 2022-01-03 ENCOUNTER — Ambulatory Visit (INDEPENDENT_AMBULATORY_CARE_PROVIDER_SITE_OTHER): Payer: 59

## 2022-01-03 ENCOUNTER — Other Ambulatory Visit: Payer: Self-pay

## 2022-01-03 ENCOUNTER — Encounter (INDEPENDENT_AMBULATORY_CARE_PROVIDER_SITE_OTHER): Payer: Self-pay | Admitting: Nurse Practitioner

## 2022-01-03 VITALS — Ht 72.0 in | Wt 204.0 lb

## 2022-01-03 DIAGNOSIS — E782 Mixed hyperlipidemia: Secondary | ICD-10-CM

## 2022-01-03 DIAGNOSIS — I739 Peripheral vascular disease, unspecified: Secondary | ICD-10-CM | POA: Diagnosis not present

## 2022-01-03 DIAGNOSIS — Z9889 Other specified postprocedural states: Secondary | ICD-10-CM

## 2022-01-03 DIAGNOSIS — I10 Essential (primary) hypertension: Secondary | ICD-10-CM

## 2022-01-03 NOTE — Progress Notes (Signed)
Subjective:    Patient ID: Hector Garcia, male    DOB: 23-Apr-1956, 66 y.o.   MRN: [id number] Chief Complaint  Patient presents with   Follow-up    Hector Garcia is a 66 year old male returns to the office for followup and review of the noninvasive studies. There have been no interval changes in lower extremity symptoms. No interval shortening of the patient's claudication distance or development of rest pain symptoms. No new ulcers or wounds have occurred since the last visit.  There have been no significant changes to the patient's overall health care.  The patient denies amaurosis fugax or recent TIA symptoms. There are no recent neurological changes noted. The patient denies history of DVT, PE or superficial thrombophlebitis. The patient denies recent episodes of angina or shortness of breath.   ABI Rt=1.05 and Lt=1.09  (previous ABI's Rt=1.06 and Lt=1.04) Duplex ultrasound of the bilateral tibial arteries reveals biphasic/triphasic waveforms with normal toe waveforms.  Additional lower extremity arterial duplex shows biphasic/triphasic waveforms throughout the left lower extremity   Review of Systems  Cardiovascular:  Negative for leg swelling.  All other systems reviewed and are negative.     Objective:   Physical Exam Vitals reviewed.  HENT:     Head: Normocephalic.  Cardiovascular:     Rate and Rhythm: Normal rate.     Pulses: Normal pulses.  Pulmonary:     Effort: Pulmonary effort is normal.  Skin:    General: Skin is warm and dry.  Neurological:     Mental Status: He is alert and oriented to person, place, and time.  Psychiatric:        Mood and Affect: Mood normal.        Behavior: Behavior normal.        Thought Content: Thought content normal.        Judgment: Judgment normal.    Ht 6' (1.829 m)    Wt 204 lb (92.5 kg)    BMI 27.67 kg/m   Past Medical History:  Diagnosis Date   CAD (coronary artery disease)    Diabetes mellitus without  complication (HCC)    Hx of dysplastic nevus 11/28/2007   R lat abdomen ant side   Hx of dysplastic nevus 04/05/2016   R of midline sacral, mild   Hx of dysplastic nevus 07/02/2019   R sacral post waistline, severe atypia   Hx of dysplastic nevus 05/08/2019   L sacral post waistline, moderate atypia   Hx of melanoma in situ 04/09/2020   Left cheek, txted with MOHs Dr. Manley Mason   Hyperlipidemia    Hypertension    Pemphigus     Social History   Socioeconomic History   Marital status: Divorced    Spouse name: Not on file   Number of children: Not on file   Years of education: Not on file   Highest education level: Not on file  Occupational History   Not on file  Tobacco Use   Smoking status: Some Days    Packs/day: 0.50    Types: Cigarettes    Last attempt to quit: 11/2016    Years since quitting: 5.1   Smokeless tobacco: Never   Tobacco comments:    may smoke 1 pack a week  Vaping Use   Vaping Use: Never used  Substance and Sexual Activity   Alcohol use: Yes    Alcohol/week: 1.0 standard drink    Types: 1 Cans of beer per week    Comment: 1  beer weekly   Drug use: No   Sexual activity: Not on file  Other Topics Concern   Not on file  Social History Narrative   Not on file   Social Determinants of Health   Financial Resource Strain: Not on file  Food Insecurity: Not on file  Transportation Needs: Not on file  Physical Activity: Not on file  Stress: Not on file  Social Connections: Not on file  Intimate Partner Violence: Not on file    Past Surgical History:  Procedure Laterality Date   CORONARY STENT INTERVENTION N/A 08/26/2019   Procedure: CORONARY STENT INTERVENTION;  Surgeon: Isaias Cowman, MD;  Location: [facility];  Service: Cardiovascular;  Laterality: N/A;   fisture surgery  1979   LEFT HEART CATH AND CORONARY ANGIOGRAPHY Left 08/26/2019   Procedure: LEFT HEART CATH AND CORONARY ANGIOGRAPHY;  Surgeon: Isaias Cowman, MD;   Location: [facility];  Service: Cardiovascular;  Laterality: Left;   LOWER EXTREMITY ANGIOGRAPHY Left 08/27/2021   Procedure: LOWER EXTREMITY ANGIOGRAPHY;  Surgeon: Katha Cabal, MD;  Location: [facility];  Service: Cardiovascular;  Laterality: Left;   ROTATOR CUFF REPAIR     TRIGGER FINGER RELEASE     x several    Family History  Problem Relation Age of Onset   Benign prostatic hyperplasia Brother    Prostate cancer Neg Hx    Bladder Cancer Neg Hx    Kidney cancer Neg Hx     No Known Allergies  CBC Latest Ref Rng & Units 08/27/2019  WBC 4.0 - 10.5 K/uL 13.5(H)  Hemoglobin 13.0 - 17.0 g/dL 13.0  Hematocrit 39.0 - 52.0 % 39.8  Platelets 150 - 400 K/uL 247      CMP     Component Value Date/Time   NA 137 08/27/2019 0412   K 4.1 08/27/2019 0412   CL 105 08/27/2019 0412   CO2 23 08/27/2019 0412   GLUCOSE 179 (H) 08/27/2019 0412   BUN 15 08/27/2021 0917   CREATININE 0.76 08/27/2021 0917   CALCIUM 8.9 08/27/2019 0412   GFRNONAA >60 08/27/2021 0917   GFRAA >60 08/27/2019 0412     No results found.     Assessment & Plan:   1. PAD (peripheral artery disease) (HCC)  Recommend:  The patient has evidence of atherosclerosis of the lower extremities with no claudication.  The patient does not voice lifestyle limiting changes at this point in time.  Noninvasive studies do not suggest clinically significant change.  No invasive studies, angiography or surgery at this time The patient should continue walking and begin a more formal exercise program.  The patient should continue with aspirin but we will have him stop Plavix at this time.  No changes in the patient's medications at this time   2. Benign essential hypertension Continue antihypertensive medications as already ordered, these medications have been reviewed and there are no changes at this time.   3. Hyperlipidemia, mixed Continue statin as ordered and reviewed, no changes at this  time    Current Outpatient Medications on File Prior to Visit  Medication Sig Dispense Refill   amLODipine (NORVASC) 10 MG tablet Take 10 mg by mouth daily.      aspirin EC 81 MG tablet Take 81 mg by mouth at bedtime.     clopidogrel (PLAVIX) 75 MG tablet TAKE 1 TABLET BY MOUTH EVERY DAY 90 tablet 1   Flaxseed, Linseed, (BIO-FLAX) 1000 MG CAPS Take 1,000 mg by mouth daily.  glimepiride (AMARYL) 2 MG tablet Take 2 mg by mouth daily.     lisinopril-hydrochlorothiazide (ZESTORETIC) 20-25 MG tablet Take 1 tablet by mouth daily.     metFORMIN (GLUCOPHAGE) 500 MG tablet Take 500 mg by mouth 2 (two) times daily with a meal.     metoprolol succinate (TOPROL-XL) 25 MG 24 hr tablet Take 0.5 tablets (12.5 mg total) by mouth daily. (Patient taking differently: Take 25 mg by mouth daily.) 30 tablet 11   niacin (NIASPAN) 500 MG CR tablet Take 500 mg by mouth at bedtime.     nitroGLYCERIN (NITROSTAT) 0.4 MG SL tablet Place 0.4 mg under the tongue every 5 (five) minutes as needed for chest pain.     Omega-3 Fatty Acids (FISH OIL) 1000 MG CPDR Take 1,000 mg by mouth daily.     omeprazole (PRILOSEC) 40 MG capsule Take 40 mg by mouth daily.     Phenylephrine-Pheniramine (DRISTAN NA) Place 1 spray into the nose daily as needed (allergies).     rosuvastatin (CRESTOR) 20 MG tablet Take 20 mg by mouth at bedtime.     sildenafil (VIAGRA) 100 MG tablet Take 100 mg by mouth as needed for erectile dysfunction.     terbinafine (LAMISIL) 250 MG tablet Take 1 tablet (250 mg total) by mouth daily. 30 tablet 0   TRULICITY 1.5 BD/5.3GD SOPN Inject 1.5 mg into the skin once a week.     vitamin C (ASCORBIC ACID) 500 MG tablet Take 500 mg by mouth daily.     zolpidem (AMBIEN) 10 MG tablet Take 5 mg by mouth at bedtime as needed for sleep.     ketoconazole (NIZORAL) 2 % cream Apply 1 application topically at bedtime. qhs to feet (Patient not taking: Reported on 01/03/2022) 60 g 3   terbinafine (LAMISIL) 250 MG tablet Take 1  tablet (250 mg total) by mouth daily. (Patient not taking: Reported on 08/23/2021) 30 tablet 0   terbinafine (LAMISIL) 250 MG tablet Take 1 tablet (250 mg total) by mouth daily. (Patient not taking: Reported on 08/23/2021) 30 tablet 1   No current facility-administered medications on file prior to visit.    There are no Patient Instructions on file for this visit. No follow-ups on file.   Kris Hartmann, NP

## 2022-01-13 ENCOUNTER — Other Ambulatory Visit: Payer: Self-pay | Admitting: Dermatology

## 2022-01-13 DIAGNOSIS — B351 Tinea unguium: Secondary | ICD-10-CM

## 2022-01-13 NOTE — Telephone Encounter (Signed)
Contacted patient and he is having no c/o s/e since starting Terbinafine. Advised patient I will refill Terbinafine and Dr. Nehemiah Massed will check tinea unguium at his regularly scheduled appointment in August.  ?

## 2022-02-10 ENCOUNTER — Other Ambulatory Visit: Payer: Self-pay | Admitting: Dermatology

## 2022-02-10 DIAGNOSIS — B351 Tinea unguium: Secondary | ICD-10-CM

## 2022-05-10 ENCOUNTER — Other Ambulatory Visit: Payer: Self-pay | Admitting: Family Medicine

## 2022-05-10 DIAGNOSIS — M5416 Radiculopathy, lumbar region: Secondary | ICD-10-CM

## 2022-05-23 ENCOUNTER — Ambulatory Visit
Admission: RE | Admit: 2022-05-23 | Discharge: 2022-05-23 | Disposition: A | Discharge status: Home / Self Care | Payer: 59 | Source: Ambulatory Visit | Attending: Family Medicine | Admitting: Family Medicine

## 2022-05-23 DIAGNOSIS — M5416 Radiculopathy, lumbar region: Secondary | ICD-10-CM

## 2022-06-14 ENCOUNTER — Encounter: Payer: Self-pay | Admitting: Dermatology

## 2022-06-14 ENCOUNTER — Ambulatory Visit: Payer: 59 | Admitting: Dermatology

## 2022-06-14 DIAGNOSIS — D229 Melanocytic nevi, unspecified: Secondary | ICD-10-CM

## 2022-06-14 DIAGNOSIS — Q828 Other specified congenital malformations of skin: Secondary | ICD-10-CM

## 2022-06-14 DIAGNOSIS — Z7189 Other specified counseling: Secondary | ICD-10-CM

## 2022-06-14 DIAGNOSIS — B353 Tinea pedis: Secondary | ICD-10-CM | POA: Diagnosis not present

## 2022-06-14 DIAGNOSIS — Z86006 Personal history of melanoma in-situ: Secondary | ICD-10-CM

## 2022-06-14 DIAGNOSIS — L814 Other melanin hyperpigmentation: Secondary | ICD-10-CM

## 2022-06-14 DIAGNOSIS — D692 Other nonthrombocytopenic purpura: Secondary | ICD-10-CM

## 2022-06-14 DIAGNOSIS — L570 Actinic keratosis: Secondary | ICD-10-CM | POA: Diagnosis not present

## 2022-06-14 DIAGNOSIS — Z1283 Encounter for screening for malignant neoplasm of skin: Secondary | ICD-10-CM

## 2022-06-14 DIAGNOSIS — L821 Other seborrheic keratosis: Secondary | ICD-10-CM

## 2022-06-14 DIAGNOSIS — L578 Other skin changes due to chronic exposure to nonionizing radiation: Secondary | ICD-10-CM

## 2022-06-14 DIAGNOSIS — D1800 Hemangioma unspecified site: Secondary | ICD-10-CM

## 2022-06-14 MED ORDER — TERBINAFINE HCL 250 MG PO TABS: 250.0000 mg | ORAL_TABLET | Freq: Every day | ORAL | 0 refills | Status: DC

## 2022-06-14 NOTE — Progress Notes (Signed)
Follow-Up Visit   Subjective  Hector Garcia is a 66 y.o. male who presents for the following: Annual Exam (Hx MMIS - L cheek ) and Tinea unguium (Toenails - improved with Lamisil treatment, recheck today). The patient presents for Total-Body Skin Exam (TBSE) for skin cancer screening and mole check.  The patient has spots, moles and lesions to be evaluated, some may be new or changing and the patient has concerns that these could be cancer.  The following portions of the chart were reviewed this encounter and updated as appropriate:   Tobacco  Allergies  Meds  Problems  Med Hx  Surg Hx  Fam Hx     Review of Systems:  No other skin or systemic complaints except as noted in HPI or Assessment and Plan.  Objective  Well appearing patient in no apparent distress; mood and affect are within normal limits.  A full examination was performed including scalp, head, eyes, ears, nose, lips, neck, chest, axillae, abdomen, back, buttocks, bilateral upper extremities, bilateral lower extremities, hands, feet, fingers, toes, fingernails, and toenails. All findings within normal limits unless otherwise noted below.  L med ankle Annular patch with raised border. 2.4 x 3.3 cm      B/L foot Scale of the feet and toenail dystrophy   L forehead x 3 (3) Erythematous thin papules/macules with gritty scale.    Assessment & Plan  Porokeratosis L med ankle Benign-appearing.  Observation.  Call clinic for new or changing lesions.  Recommend daily use of broad spectrum spf 30+ sunscreen to sun-exposed areas.   Tinea pedis of both feet B/L foot Restart Lamisil '250mg'$  po QD. #30 0RF.   Terbinafine Counseling Terbinafine is an anti-fungal medicine that can be applied to the skin (over the counter) or taken by mouth (prescription) to treat fungal infections. The pill version is often used to treat fungal infections of the nails or scalp. While most people do not have any side effects from taking  terbinafine pills, some possible side effects of the medicine can include taste changes, headache, loss of smell, vision changes, nausea, vomiting, or diarrhea.  Rare side effects can include irritation of the liver, allergic reaction, or decrease in blood counts (which may show up as not feeling well or developing an infection). If you are concerned about any of these side effects, please stop the medicine and call your doctor, or in the case of an emergency such as feeling very unwell, seek immediate medical care.   terbinafine (LAMISIL) 250 MG tablet - B/L foot Take 1 tablet (250 mg total) by mouth daily.  AK (actinic keratosis) (3) L forehead x 3 Destruction of lesion - L forehead x 3 Complexity: simple   Destruction method: cryotherapy   Informed consent: discussed and consent obtained   Timeout:  patient name, date of birth, surgical site, and procedure verified Lesion destroyed using liquid nitrogen: Yes   Region frozen until ice ball extended beyond lesion: Yes   Outcome: patient tolerated procedure well with no complications   Post-procedure details: wound care instructions given    Lentigines - Scattered tan macules - Due to sun exposure - Benign-appearing, observe - Recommend daily broad spectrum sunscreen SPF 30+ to sun-exposed areas, reapply every 2 hours as needed. - Call for any changes  Seborrheic Keratoses - Stuck-on, waxy, tan-brown papules and/or plaques  - Benign-appearing - Discussed benign etiology and prognosis. - Observe - Call for any changes  Melanocytic Nevi - Tan-brown and/or pink-flesh-colored symmetric macules  and papules - Benign appearing on exam today - Observation - Call clinic for new or changing moles - Recommend daily use of broad spectrum spf 30+ sunscreen to sun-exposed areas.   Hemangiomas - Red papules - Discussed benign nature - Observe - Call for any changes  Actinic Damage - Chronic condition, secondary to cumulative UV/sun  exposure - diffuse scaly erythematous macules with underlying dyspigmentation - Recommend daily broad spectrum sunscreen SPF 30+ to sun-exposed areas, reapply every 2 hours as needed.  - Staying in the shade or wearing long sleeves, sun glasses (UVA+UVB protection) and wide brim hats (4-inch brim around the entire circumference of the hat) are also recommended for sun protection.  - Call for new or changing lesions.  Purpura - Chronic; persistent and recurrent.  Treatable, but not curable. - Violaceous macules and patches - Benign - Related to trauma, age, sun damage and/or use of blood thinners, chronic use of topical and/or oral steroids - Observe - Can use OTC arnica containing moisturizer such as Dermend Bruise Formula if desired - Call for worsening or other concerns  History of Melanoma in Situ - L cheek  - No evidence of recurrence today - Recommend regular full body skin exams - Recommend daily broad spectrum sunscreen SPF 30+ to sun-exposed areas, reapply every 2 hours as needed.  - Call if any new or changing lesions are noted between office visits  Skin cancer screening performed today.  Return in about 6 months (around 12/15/2022) for TBSE.  Luther Redo, CMA, am acting as scribe for Sarina Ser, MD . Documentation: I have reviewed the above documentation for accuracy and completeness, and I agree with the above.  Sarina Ser, MD

## 2022-06-14 NOTE — Patient Instructions (Signed)
Due to recent changes in healthcare laws, you may see results of your pathology and/or laboratory studies on MyChart before the doctors have had a chance to review them. We understand that in some cases there may be results that are confusing or concerning to you. Please understand that not all results are received at the same time and often the doctors may need to interpret multiple results in order to provide you with the best plan of care or course of treatment. Therefore, we ask that you please give us 2 business days to thoroughly review all your results before contacting the office for clarification. Should we see a critical lab result, you will be contacted sooner.   If You Need Anything After Your Visit  If you have any questions or concerns for your doctor, please call our main line at [phone] and press option 4 to reach your doctor's medical assistant. If no one answers, please leave a voicemail as directed and we will return your call as soon as possible. Messages left after 4 pm will be answered the following business day.   You may also send us a message via MyChart. We typically respond to MyChart messages within 1-2 business days.  For prescription refills, please ask your pharmacy to contact our office. Our fax number is [phone].  If you have an urgent issue when the clinic is closed that cannot wait until the next business day, you can page your doctor at the number below.    Please note that while we do our best to be available for urgent issues outside of office hours, we are not available 24/7.   If you have an urgent issue and are unable to reach us, you may choose to seek medical care at your doctor's office, retail clinic, urgent care center, or emergency room.  If you have a medical emergency, please immediately call 911 or go to the emergency department.  Pager Numbers  - Dr. Kowalski: [phone]  - Dr. Moye: [phone]  - Dr. Stewart:  [phone]  In the event of inclement weather, please call our main line at [phone] for an update on the status of any delays or closures.  Dermatology Medication Tips: Please keep the boxes that topical medications come in in order to help keep track of the instructions about where and how to use these. Pharmacies typically print the medication instructions only on the boxes and not directly on the medication tubes.   If your medication is too expensive, please contact our office at [phone] option 4 or send us a message through MyChart.   We are unable to tell what your co-pay for medications will be in advance as this is different depending on your insurance coverage. However, we may be able to find a substitute medication at lower cost or fill out paperwork to get insurance to cover a needed medication.   If a prior authorization is required to get your medication covered by your insurance company, please allow us 1-2 business days to complete this process.  Drug prices often vary depending on where the prescription is filled and some pharmacies may offer cheaper prices.  The website www.goodrx.com contains coupons for medications through different pharmacies. The prices here do not account for what the cost may be with help from insurance (it may be cheaper with your insurance), but the website can give you the price if you did not use any insurance.  - You can print the associated coupon and take it with   your prescription to the pharmacy.  - You may also stop by our office during regular business hours and pick up a GoodRx coupon card.  - If you need your prescription sent electronically to a different pharmacy, notify our office through [facility] MyChart or by phone at [phone] option 4.     Si Usted Necesita Algo Despus de Su Visita  Tambin puede enviarnos un mensaje a travs de MyChart. Por lo general respondemos a los mensajes de MyChart en el transcurso de 1 a 2  das hbiles.  Para renovar recetas, por favor pida a su farmacia que se ponga en contacto con nuestra oficina. Nuestro nmero de fax es el [phone].  Si tiene un asunto urgente cuando la clnica est cerrada y que no puede esperar hasta el siguiente da hbil, puede llamar/localizar a su doctor(a) al nmero que aparece a continuacin.   Por favor, tenga en cuenta que aunque hacemos todo lo posible para estar disponibles para asuntos urgentes fuera del horario de oficina, no estamos disponibles las 24 horas del da, los 7 das de la semana.   Si tiene un problema urgente y no puede comunicarse con nosotros, puede optar por buscar atencin mdica  en el consultorio de su doctor(a), en una clnica privada, en un centro de atencin urgente o en una sala de emergencias.  Si tiene una emergencia mdica, por favor llame inmediatamente al 911 o vaya a la sala de emergencias.  Nmeros de bper  - Dr. Kowalski: [phone]  - Dra. Moye: [phone]  - Dra. Stewart: [phone]  En caso de inclemencias del tiempo, por favor llame a nuestra lnea principal al [phone] para una actualizacin sobre el estado de cualquier retraso o cierre.  Consejos para la medicacin en dermatologa: Por favor, guarde las cajas en las que vienen los medicamentos de uso tpico para ayudarle a seguir las instrucciones sobre dnde y cmo usarlos. Las farmacias generalmente imprimen las instrucciones del medicamento slo en las cajas y no directamente en los tubos del medicamento.   Si su medicamento es muy caro, por favor, pngase en contacto con nuestra oficina llamando al [phone] y presione la opcin 4 o envenos un mensaje a travs de MyChart.   No podemos decirle cul ser su copago por los medicamentos por adelantado ya que esto es diferente dependiendo de la cobertura de su seguro. Sin embargo, es posible que podamos encontrar un medicamento sustituto a menor costo o llenar un formulario para que el  seguro cubra el medicamento que se considera necesario.   Si se requiere una autorizacin previa para que su compaa de seguros cubra su medicamento, por favor permtanos de 1 a 2 das hbiles para completar este proceso.  Los precios de los medicamentos varan con frecuencia dependiendo del lugar de dnde se surte la receta y alguna farmacias pueden ofrecer precios ms baratos.  El sitio web www.goodrx.com tiene cupones para medicamentos de diferentes farmacias. Los precios aqu no tienen en cuenta lo que podra costar con la ayuda del seguro (puede ser ms barato con su seguro), pero el sitio web puede darle el precio si no utiliz ningn seguro.  - Puede imprimir el cupn correspondiente y llevarlo con su receta a la farmacia.  - Tambin puede pasar por nuestra oficina durante el horario de atencin regular y recoger una tarjeta de cupones de GoodRx.  - Si necesita que su receta se enve electrnicamente a una farmacia diferente, informe a nuestra oficina a travs de MyChart de [facility]   o por telfono llamando al [phone] y presione la opcin 4.  

## 2022-06-24 ENCOUNTER — Other Ambulatory Visit (INDEPENDENT_AMBULATORY_CARE_PROVIDER_SITE_OTHER): Payer: Self-pay | Admitting: Nurse Practitioner

## 2022-06-24 DIAGNOSIS — I739 Peripheral vascular disease, unspecified: Secondary | ICD-10-CM

## 2022-06-24 NOTE — Progress Notes (Unsigned)
MRN : [id number]  Hector Garcia is a 66 y.o. (04/17/1956) male who presents with chief complaint of check circulation.  History of Present Illness:   The patient returns to the office for followup and review of the noninvasive studies. There have been no interval changes in lower extremity symptoms. No interval shortening of the patient's claudication distance or development of rest pain symptoms. No new ulcers or wounds have occurred since the last visit.   There have been no significant changes to the patient's overall health care.   The patient denies amaurosis fugax or recent TIA symptoms. There are no recent neurological changes noted. The patient denies history of DVT, PE or superficial thrombophlebitis. The patient denies recent episodes of angina or shortness of breath.    ABI Rt=1.05 and Lt=1.09  (previous ABI's Rt=1.06 and Lt=1.04).  Duplex ultrasound of the bilateral tibial arteries reveals biphasic/triphasic waveforms with normal toe waveforms.  Additional lower extremity arterial duplex shows biphasic/triphasic waveforms throughout the left lower extremity  No outpatient medications have been marked as taking for the 06/27/22 encounter (Appointment) with Delana Meyer, Dolores Lory, MD.    Past Medical History:  Diagnosis Date   CAD (coronary artery disease)    Diabetes mellitus without complication ([place])    Hx of dysplastic nevus 11/28/2007   R lat abdomen ant side   Hx of dysplastic nevus 04/05/2016   R of midline sacral, mild   Hx of dysplastic nevus 07/02/2019   R sacral post waistline, severe atypia   Hx of dysplastic nevus 05/08/2019   L sacral post waistline, moderate atypia   Hx of melanoma in situ 04/09/2020   Left cheek, txted with MOHs Dr. Manley Mason   Hyperlipidemia    Hypertension    Pemphigus     Past Surgical History:  Procedure Laterality Date   CORONARY STENT INTERVENTION N/A 08/26/2019   Procedure: CORONARY STENT INTERVENTION;   Surgeon: Isaias Cowman, MD;  Location: [facility];  Service: Cardiovascular;  Laterality: N/A;   fisture surgery  1979   LEFT HEART CATH AND CORONARY ANGIOGRAPHY Left 08/26/2019   Procedure: LEFT HEART CATH AND CORONARY ANGIOGRAPHY;  Surgeon: Isaias Cowman, MD;  Location: [facility];  Service: Cardiovascular;  Laterality: Left;   LOWER EXTREMITY ANGIOGRAPHY Left 08/27/2021   Procedure: LOWER EXTREMITY ANGIOGRAPHY;  Surgeon: Katha Cabal, MD;  Location: [facility];  Service: Cardiovascular;  Laterality: Left;   ROTATOR CUFF REPAIR     TRIGGER FINGER RELEASE     x several    Social History Social History   Tobacco Use   Smoking status: Some Days    Packs/day: 0.50    Types: Cigarettes    Last attempt to quit: 11/2016    Years since quitting: 5.6   Smokeless tobacco: Never   Tobacco comments:    may smoke 1 pack a week  Vaping Use   Vaping Use: Never used  Substance Use Topics   Alcohol use: Yes    Alcohol/week: 1.0 standard drink of alcohol    Types: 1 Cans of beer per week    Comment: 1 beer weekly   Drug use: No    Family History Family History  Problem Relation Age of Onset   Benign prostatic hyperplasia Brother    Prostate cancer Neg Hx    Bladder Cancer Neg Hx    Kidney cancer Neg Hx  No Known Allergies   REVIEW OF SYSTEMS (Negative unless checked)  Constitutional: '[]'$ Weight loss  '[]'$ Fever  '[]'$ Chills Cardiac: '[]'$ Chest pain   '[]'$ Chest pressure   '[]'$ Palpitations   '[]'$ Shortness of breath when laying flat   '[]'$ Shortness of breath with exertion. Vascular:  '[x]'$ Pain in legs with walking   '[]'$ Pain in legs at rest  '[]'$ History of DVT   '[]'$ Phlebitis   '[]'$ Swelling in legs   '[]'$ Varicose veins   '[]'$ Non-healing ulcers Pulmonary:   '[]'$ Uses home oxygen   '[]'$ Productive cough   '[]'$ Hemoptysis   '[]'$ Wheeze  '[]'$ COPD   '[]'$ Asthma Neurologic:  '[]'$ Dizziness   '[]'$ Seizures   '[]'$ History of stroke   '[]'$ History of TIA  '[]'$ Aphasia   '[]'$ Vissual changes   '[]'$ Weakness or  numbness in arm   '[]'$ Weakness or numbness in leg Musculoskeletal:   '[]'$ Joint swelling   '[]'$ Joint pain   '[]'$ Low back pain Hematologic:  '[]'$ Easy bruising  '[]'$ Easy bleeding   '[]'$ Hypercoagulable state   '[]'$ Anemic Gastrointestinal:  '[]'$ Diarrhea   '[]'$ Vomiting  '[]'$ Gastroesophageal reflux/heartburn   '[]'$ Difficulty swallowing. Genitourinary:  '[]'$ Chronic kidney disease   '[]'$ Difficult urination  '[]'$ Frequent urination   '[]'$ Blood in urine Skin:  '[]'$ Rashes   '[]'$ Ulcers  Psychological:  '[]'$ History of anxiety   '[]'$  History of major depression.  Physical Examination  There were no vitals filed for this visit. There is no height or weight on file to calculate BMI. Gen: WD/WN, NAD Head: [place]/AT, No temporalis wasting.  Ear/Nose/Throat: Hearing grossly intact, nares w/o erythema or drainage Eyes: PER, EOMI, sclera nonicteric.  Neck: Supple, no masses.  No bruit or JVD.  Pulmonary:  Good air movement, no audible wheezing, no use of accessory muscles.  Cardiac: RRR, normal S1, S2, no Murmurs. Vascular:  mild trophic changes, no open wounds Vessel Right Left  Radial Palpable Palpable  PT Not Palpable Not Palpable  DP Not Palpable Not Palpable  Gastrointestinal: soft, non-distended. No guarding/no peritoneal signs.  Musculoskeletal: M/S 5/5 throughout.  No visible deformity.  Neurologic: CN 2-12 intact. Pain and light touch intact in extremities.  Symmetrical.  Speech is fluent. Motor exam as listed above. Psychiatric: Judgment intact, Mood & affect appropriate for pt's clinical situation. Dermatologic: No rashes or ulcers noted.  No changes consistent with cellulitis.   CBC Lab Results  Component Value Date   WBC 13.5 (H) 08/27/2019   HGB 13.0 08/27/2019   HCT 39.8 08/27/2019   MCV 88.6 08/27/2019   PLT 247 08/27/2019    BMET    Component Value Date/Time   NA 137 08/27/2019 0412   K 4.1 08/27/2019 0412   CL 105 08/27/2019 0412   CO2 23 08/27/2019 0412   GLUCOSE 179 (H) 08/27/2019 0412   BUN 15 08/27/2021 0917    CREATININE 0.76 08/27/2021 0917   CALCIUM 8.9 08/27/2019 0412   GFRNONAA >60 08/27/2021 0917   GFRAA >60 08/27/2019 0412   CrCl cannot be calculated (Patient's most recent lab result is older than the maximum 21 days allowed.).  COAG No results found for: "INR", "PROTIME"  Radiology No results found.   Assessment/Plan There are no diagnoses linked to this encounter.   Hortencia Pilar, MD  06/24/2022 2:26 PM

## 2022-06-27 ENCOUNTER — Ambulatory Visit (INDEPENDENT_AMBULATORY_CARE_PROVIDER_SITE_OTHER): Payer: 59

## 2022-06-27 ENCOUNTER — Ambulatory Visit (INDEPENDENT_AMBULATORY_CARE_PROVIDER_SITE_OTHER): Payer: 59 | Admitting: Vascular Surgery

## 2022-06-27 ENCOUNTER — Encounter (INDEPENDENT_AMBULATORY_CARE_PROVIDER_SITE_OTHER): Payer: Self-pay | Admitting: Vascular Surgery

## 2022-06-27 VITALS — BP 154/71 | HR 53 | Resp 17 | Ht 72.0 in | Wt 208.2 lb

## 2022-06-27 DIAGNOSIS — I70212 Atherosclerosis of native arteries of extremities with intermittent claudication, left leg: Secondary | ICD-10-CM

## 2022-06-27 DIAGNOSIS — E119 Type 2 diabetes mellitus without complications: Secondary | ICD-10-CM | POA: Diagnosis not present

## 2022-06-27 DIAGNOSIS — Z9889 Other specified postprocedural states: Secondary | ICD-10-CM

## 2022-06-27 DIAGNOSIS — I739 Peripheral vascular disease, unspecified: Secondary | ICD-10-CM | POA: Diagnosis not present

## 2022-06-27 DIAGNOSIS — E782 Mixed hyperlipidemia: Secondary | ICD-10-CM

## 2022-06-27 DIAGNOSIS — I10 Essential (primary) hypertension: Secondary | ICD-10-CM

## 2022-06-28 ENCOUNTER — Ambulatory Visit: Payer: 59 | Admitting: Physical Therapy

## 2022-06-28 NOTE — Therapy (Unsigned)
OUTPATIENT PHYSICAL THERAPY THORACOLUMBAR EVALUATION   Patient Name: Hector Garcia MRN: [id number] DOB:13-Feb-1956, 66 y.o., male Today's Date: 06/28/2022    Past Medical History:  Diagnosis Date   CAD (coronary artery disease)    Diabetes mellitus without complication ([place])    Hx of dysplastic nevus 11/28/2007   R lat abdomen ant side   Hx of dysplastic nevus 04/05/2016   R of midline sacral, mild   Hx of dysplastic nevus 07/02/2019   R sacral post waistline, severe atypia   Hx of dysplastic nevus 05/08/2019   L sacral post waistline, moderate atypia   Hx of melanoma in situ 04/09/2020   Left cheek, txted with MOHs Dr. Manley Mason   Hyperlipidemia    Hypertension    Pemphigus    Past Surgical History:  Procedure Laterality Date   CORONARY STENT INTERVENTION N/A 08/26/2019   Procedure: CORONARY STENT INTERVENTION;  Surgeon: Isaias Cowman, MD;  Location: [facility];  Service: Cardiovascular;  Laterality: N/A;   fisture surgery  1979   LEFT HEART CATH AND CORONARY ANGIOGRAPHY Left 08/26/2019   Procedure: LEFT HEART CATH AND CORONARY ANGIOGRAPHY;  Surgeon: Isaias Cowman, MD;  Location: [facility];  Service: Cardiovascular;  Laterality: Left;   LOWER EXTREMITY ANGIOGRAPHY Left 08/27/2021   Procedure: LOWER EXTREMITY ANGIOGRAPHY;  Surgeon: Katha Cabal, MD;  Location: [facility];  Service: Cardiovascular;  Laterality: Left;   ROTATOR CUFF REPAIR     TRIGGER FINGER RELEASE     x several   Patient Active Problem List   Diagnosis Date Noted   PAD (peripheral artery disease) ([place]) 09/20/2021   Atherosclerosis of native arteries of extremity with intermittent claudication ([place]) 08/04/2021   Pemphigus vulgaris 08/02/2021   RLS (restless legs syndrome) 08/02/2021   Tobacco abuse 08/02/2021   S/P drug eluting coronary stent placement 09/05/2019   Chest pain 08/26/2019   Rotator cuff tendinitis, left 03/11/2019   Lumbar disc disease  01/26/2017   Tubular adenoma 01/26/2017   Benign essential hypertension 07/20/2016   Controlled type 2 diabetes mellitus without complication, without long-term current use of insulin ([place]) 07/20/2016   Hyperlipidemia, mixed 07/20/2016    PCP: Rusty Aus, MD  REFERRING PROVIDER: Meeler, Sherren Kerns, FNP  THERAPY DIAG:  No diagnosis found.  REFERRING DIAG: Radiculopathy, lumbar region [M54.16], Other intervertebral disc degeneration, lumbar region [M51.36]  Rationale for Evaluation and Treatment Rehabilitation  SUBJECTIVE:  PERTINENT PAST HISTORY:  CAD        PRECAUTIONS: {Therapy precautions:24002}  WEIGHT BEARING RESTRICTIONS {Yes ***/No:24003}  FALLS:  Has patient fallen in last 6 months? {yes/no:20286}, Number of falls: ***  MOI/History of condition:  Onset date: ***  [name] is a 66 y.o. male who presents to clinic with chief complaint of ***.  ***  From referring provider:      Red flags:  {has/denies:26543} {kerredflag:26542}  Pain:  Are you having pain? {yes/no:20286} Pain location: *** NPRS scale:  current {NUMBERS; 0-10:5044}/10  average {NUMBERS; 0-10:5044}/10  Aggravating factors: ***  NPRS, highest: {NUMBERS; 0-10:5044}/10 Relieving factors: ***  NPRS: best: {NUMBERS; 0-10:5044}/10 Pain description: {PAIN DESCRIPTION:21022940} Stage: {Desc; acute/subacute/chronic:13799} Stability: {kerbetterworse:26715} 24 hour pattern: ***   Occupation: ***  Assistive Device: ***  Hand Dominance: ***  Patient Goals/Specific Activities: ***   OBJECTIVE:   DIAGNOSTIC FINDINGS:  ***  GENERAL OBSERVATION/GAIT:  ***  SENSATION:  Light touch: {intact/deficits:24005}  MUSCLE LENGTH: Hamstrings: Right {kerminsig:27227} restriction; Left {kerminsig:27227} restriction ASLR: Right {keraslr:27228}; Left {keraslr:27228} [name]  test: Right {kerminsig:27227} restriction; Left {kerminsig:27227} restriction Ely's test: Right {kerminsig:27227}  restriction; Left {kerminsig:27227} restriction    LUMBAR AROM  AROM AROM  06/28/2022  Flexion {kerromcxlx:26716}  Extension {kerromcxlx:26716}  Right lateral flexion {kerromcxlx:26716}  Left lateral flexion {kerromcxlx:26716}  Right rotation {kerromcxlx:26716}  Left rotation {kerromcxlx:26716}    (Blank rows = not tested)  DIRECTIONAL PREFERENCE:  ***  LE MMT:  MMT Right 06/28/2022 Left 06/28/2022  Hip flexion (L2, L3) *** ***  Knee extension (L3) *** ***  Knee flexion *** ***  Hip abduction *** ***  Hip extension *** ***  Hip external rotation    Hip internal rotation    Hip adduction    Ankle dorsiflexion (L4)    Ankle plantarflexion (S1)    Ankle inversion    Ankle eversion    Great Toe ext (L5)    Grossly     (Blank rows = not tested, score listed is out of 5 possible points.  N = WNL, D = diminished, C = clear for gross weakness with myotome testing, * = concordant pain with testing)   LE ROM:  ROM Right 06/28/2022 Left 06/28/2022  Hip flexion    Hip extension    Hip abduction    Hip adduction    Hip internal rotation    Hip external rotation    Knee flexion    Knee extension    Ankle dorsiflexion    Ankle plantarflexion    Ankle inversion    Ankle eversion      (Blank rows = not tested, N = WNL, * = concordant pain with testing)  Functional Tests  Eval (06/28/2022)                                                               LUMBAR SPECIAL TESTS:  Straight leg raise: L (***), R (***) Slump: L (***), R (***)   PALPATION:   ***   SPINAL SEGMENTAL MOBILITY ASSESSMENT:  ***  PATIENT SURVEYS:  {rehab surveys:24030}   TODAY'S TREATMENT  ***  PATIENT EDUCATION:  POC, diagnosis, prognosis, HEP, and outcome measures.  Pt educated via explanation, demonstration, and handout (HEP).  Pt confirms understanding verbally.   HOME EXERCISE PROGRAM: ***  ASTERISK SIGNS   Asterisk Signs Eval (06/28/2022)                                                  ASSESSMENT:  CLINICAL IMPRESSION: Mckay is a 66 y.o. male who presents to clinic with signs and sxs consistent with ***.    OBJECTIVE IMPAIRMENTS: Pain, ***  ACTIVITY LIMITATIONS: ***  PERSONAL FACTORS: See medical history and pertinent history   REHAB POTENTIAL: {rehabpotential:25112}  CLINICAL DECISION MAKING: {clinical decision making:25114}  EVALUATION COMPLEXITY: {Evaluation complexity:25115}   GOALS:   SHORT TERM GOALS: Target date: 07/19/2022  Myreon will be >75% HEP compliant to improve carryover between sessions and facilitate independent management of condition  Evaluation (06/28/2022): ongoing Goal status: INITIAL   LONG TERM GOALS: Target date: 08/23/2022  ***   2.  ***   3.  ***   4.  ***   5.  ***  6.  ***   PLAN: PT FREQUENCY: 1-2x/week  PT DURATION: 8 weeks (Ending 08/23/2022)  PLANNED INTERVENTIONS: Therapeutic exercises, Aquatic therapy, Therapeutic activity, Neuro Muscular re-education, Gait training, Patient/Family education, Joint mobilization, Dry Needling, Electrical stimulation, Spinal mobilization and/or manipulation, Moist heat, Taping, Vasopneumatic device, Ionotophoresis '4mg'$ /ml Dexamethasone, and Manual therapy  PLAN FOR NEXT SESSION: ***   Shearon Balo PT, DPT 06/28/2022, 4:49 PM

## 2022-06-29 ENCOUNTER — Telehealth (INDEPENDENT_AMBULATORY_CARE_PROVIDER_SITE_OTHER): Payer: Self-pay

## 2022-06-29 NOTE — Telephone Encounter (Signed)
Spoke with the patient and he is scheduled with Dr. Delana Meyer on 07/06/22 with a 1:15 pm arrival time to the [facility] for a left leg angio. Pre-procedure instructions were discussed and will be mailed.

## 2022-07-06 ENCOUNTER — Encounter: Payer: Self-pay | Admitting: Vascular Surgery

## 2022-07-06 ENCOUNTER — Ambulatory Visit
Admission: RE | Admit: 2022-07-06 | Discharge: 2022-07-06 | Disposition: A | Discharge status: Home / Self Care | Payer: 59 | Attending: Vascular Surgery | Admitting: Vascular Surgery

## 2022-07-06 ENCOUNTER — Encounter
Admission: RE | Disposition: A | Discharge status: Home / Self Care | Payer: Self-pay | Source: Home / Self Care | Attending: Vascular Surgery

## 2022-07-06 ENCOUNTER — Other Ambulatory Visit: Payer: Self-pay

## 2022-07-06 DIAGNOSIS — I700 Atherosclerosis of aorta: Secondary | ICD-10-CM

## 2022-07-06 DIAGNOSIS — T82898A Other specified complication of vascular prosthetic devices, implants and grafts, initial encounter: Secondary | ICD-10-CM

## 2022-07-06 DIAGNOSIS — I743 Embolism and thrombosis of arteries of the lower extremities: Secondary | ICD-10-CM

## 2022-07-06 DIAGNOSIS — I70222 Atherosclerosis of native arteries of extremities with rest pain, left leg: Secondary | ICD-10-CM | POA: Insufficient documentation

## 2022-07-06 DIAGNOSIS — I701 Atherosclerosis of renal artery: Secondary | ICD-10-CM

## 2022-07-06 DIAGNOSIS — Z9889 Other specified postprocedural states: Secondary | ICD-10-CM

## 2022-07-06 DIAGNOSIS — I70211 Atherosclerosis of native arteries of extremities with intermittent claudication, right leg: Secondary | ICD-10-CM | POA: Diagnosis not present

## 2022-07-06 DIAGNOSIS — I708 Atherosclerosis of other arteries: Secondary | ICD-10-CM

## 2022-07-06 DIAGNOSIS — T82858A Stenosis of vascular prosthetic devices, implants and grafts, initial encounter: Secondary | ICD-10-CM

## 2022-07-06 DIAGNOSIS — I70219 Atherosclerosis of native arteries of extremities with intermittent claudication, unspecified extremity: Secondary | ICD-10-CM

## 2022-07-06 HISTORY — PX: LOWER EXTREMITY ANGIOGRAPHY: CATH118251

## 2022-07-06 LAB — CREATININE, SERUM
Creatinine, Ser: 0.89 mg/dL (ref 0.61–1.24)
GFR, Estimated: >60 mL/min (ref >60)

## 2022-07-06 LAB — GLUCOSE, CAPILLARY: Glucose-Capillary: 237 mg/dL — ABNORMAL HIGH (ref 70–99)

## 2022-07-06 LAB — BUN: BUN: 14 mg/dL (ref 8–23)

## 2022-07-06 SURGERY — LOWER EXTREMITY ANGIOGRAPHY
Anesthesia: Moderate Sedation | Site: Leg Lower | Laterality: Left

## 2022-07-06 MED ORDER — MIDAZOLAM HCL 2 MG/2ML IJ SOLN
INTRAMUSCULAR | Status: DC | PRN
  Administered 2022-07-06: 2 mg via INTRAVENOUS
  Administered 2022-07-06 (×3): 1 mg via INTRAVENOUS

## 2022-07-06 MED ORDER — MIDAZOLAM HCL 2 MG/ML PO SYRP
8.0000 mg | ORAL_SOLUTION | Freq: Once | ORAL | Status: DC | PRN
Start: 2022-07-06 — End: 2022-07-06

## 2022-07-06 MED ORDER — CLOPIDOGREL BISULFATE 300 MG PO TABS: 300.0000 mg | ORAL_TABLET | ORAL | Status: DC

## 2022-07-06 MED ORDER — PANTOPRAZOLE SODIUM 40 MG PO TBEC: 40.0000 mg | DELAYED_RELEASE_TABLET | Freq: Every day | ORAL | 5 refills | Status: AC

## 2022-07-06 MED ORDER — METHYLPREDNISOLONE SODIUM SUCC 125 MG IJ SOLR: 125.0000 mg | Freq: Once | INTRAMUSCULAR | Status: DC | PRN

## 2022-07-06 MED ORDER — MIDAZOLAM HCL 2 MG/2ML IJ SOLN
INTRAMUSCULAR | Status: AC
  Filled 2022-07-06: qty 2

## 2022-07-06 MED ORDER — CEFAZOLIN SODIUM-DEXTROSE 2-4 GM/100ML-% IV SOLN
INTRAVENOUS | Status: AC
  Filled 2022-07-06: qty 100

## 2022-07-06 MED ORDER — HEPARIN SODIUM (PORCINE) 1000 UNIT/ML IJ SOLN
INTRAMUSCULAR | Status: DC | PRN
  Administered 2022-07-06: 6000 [IU] via INTRAVENOUS

## 2022-07-06 MED ORDER — DIPHENHYDRAMINE HCL 50 MG/ML IJ SOLN
50.0000 mg | Freq: Once | INTRAMUSCULAR | Status: DC | PRN
Start: 2022-07-06 — End: 2022-07-06

## 2022-07-06 MED ORDER — HYDROMORPHONE HCL 1 MG/ML IJ SOLN: 1.0000 mg | Freq: Once | INTRAMUSCULAR | Status: DC | PRN

## 2022-07-06 MED ORDER — CEFAZOLIN SODIUM-DEXTROSE 2-4 GM/100ML-% IV SOLN
2.0000 g | INTRAVENOUS | Status: AC
  Administered 2022-07-06: 2 g via INTRAVENOUS

## 2022-07-06 MED ORDER — FENTANYL CITRATE (PF) 100 MCG/2ML IJ SOLN
INTRAMUSCULAR | Status: DC | PRN
Start: 2022-07-06 — End: 2022-07-06
  Administered 2022-07-06 (×3): 25 ug via INTRAVENOUS
  Administered 2022-07-06: 50 ug via INTRAVENOUS

## 2022-07-06 MED ORDER — NITROGLYCERIN 1 MG/10 ML FOR IR/CATH LAB
INTRA_ARTERIAL | Status: DC | PRN
  Administered 2022-07-06: 400 ug via INTRA_ARTERIAL
  Administered 2022-07-06: 300 ug via INTRA_ARTERIAL

## 2022-07-06 MED ORDER — CLOPIDOGREL BISULFATE 75 MG PO TABS: 75.0000 mg | ORAL_TABLET | Freq: Every day | ORAL | 1 refills | Status: DC

## 2022-07-06 MED ORDER — FENTANYL CITRATE PF 50 MCG/ML IJ SOSY
PREFILLED_SYRINGE | INTRAMUSCULAR | Status: AC
  Filled 2022-07-06: qty 1

## 2022-07-06 MED ORDER — NITROGLYCERIN 1 MG/10 ML FOR IR/CATH LAB
INTRA_ARTERIAL | Status: AC
  Filled 2022-07-06: qty 10

## 2022-07-06 MED ORDER — HEPARIN SODIUM (PORCINE) 1000 UNIT/ML IJ SOLN
INTRAMUSCULAR | Status: AC
  Filled 2022-07-06: qty 10

## 2022-07-06 MED ORDER — IODIXANOL 320 MG/ML IV SOLN
INTRAVENOUS | Status: DC | PRN
  Administered 2022-07-06: 75 mL via INTRA_ARTERIAL

## 2022-07-06 MED ORDER — ONDANSETRON HCL 4 MG/2ML IJ SOLN
4.0000 mg | Freq: Four times a day (QID) | INTRAMUSCULAR | Status: DC | PRN
Start: 2022-07-06 — End: 2022-07-06

## 2022-07-06 MED ORDER — SODIUM CHLORIDE 0.9 % IV SOLN: INTRAVENOUS | Status: DC

## 2022-07-06 MED ORDER — MORPHINE SULFATE (PF) 4 MG/ML IV SOLN: 2.0000 mg | INTRAVENOUS | Status: DC | PRN

## 2022-07-06 MED ORDER — FAMOTIDINE 20 MG PO TABS: 40.0000 mg | ORAL_TABLET | Freq: Once | ORAL | Status: DC | PRN

## 2022-07-06 SURGICAL SUPPLY — 30 items
BALLN LUTONIX 018 5X300X130 (BALLOONS) ×1
BALLN LUTONIX 018 6X300X130 (BALLOONS) ×1
BALLN LUTONIX 018 6X60X130 (BALLOONS) ×1
BALLN LUTONIX 018 6X80X130 (BALLOONS) ×1
BALLOON LUTONIX 018 5X300X130 (BALLOONS) IMPLANT
BALLOON LUTONIX 018 6X300X130 (BALLOONS) IMPLANT
BALLOON LUTONIX 018 6X60X130 (BALLOONS) IMPLANT
BALLOON LUTONIX 018 6X80X130 (BALLOONS) IMPLANT
CATH ANGIO 5F PIGTAIL 65CM (CATHETERS) IMPLANT
CATH BEACON 5 .035 65 KMP TIP (CATHETERS) IMPLANT
CATH ROTAREX 135 6FR (CATHETERS) IMPLANT
CATH VERT 5X100 (CATHETERS) IMPLANT
DEVICE STARCLOSE SE CLOSURE (Vascular Products) IMPLANT
GLIDEWIRE ADV .035X260CM (WIRE) IMPLANT
KIT ENCORE 26 ADVANTAGE (KITS) IMPLANT
NDL ENTRY 21GA 7CM ECHOTIP (NEEDLE) IMPLANT
NEEDLE ENTRY 21GA 7CM ECHOTIP (NEEDLE) ×1 IMPLANT
PACK ANGIOGRAPHY (CUSTOM PROCEDURE TRAY) ×1 IMPLANT
SET INTRO CAPELLA COAXIAL (SET/KITS/TRAYS/PACK) IMPLANT
SHEATH ANL2 6FRX45 HC (SHEATH) IMPLANT
SHEATH BRITE TIP 6FRX11 (SHEATH) IMPLANT
SHEATH FLEXOR ANSEL2 7FRX45 (SHEATH) IMPLANT
SHEATH PROBE COVER 6X72 (BAG) IMPLANT
STENT LIFESTENT 5F 7X40X135 (Permanent Stent) IMPLANT
STENT VIABAHN 7X100X120 (Permanent Stent) IMPLANT
STENT VIABAHN 7X25X120 (Permanent Stent) IMPLANT
SYR MEDRAD MARK 7 150ML (SYRINGE) IMPLANT
TUBING CONTRAST HIGH PRESS 72 (TUBING) IMPLANT
WIRE G V18X300CM (WIRE) IMPLANT
WIRE GUIDERIGHT .035X150 (WIRE) IMPLANT

## 2022-07-06 NOTE — Op Note (Signed)
[facility]  Percutaneous Study/Intervention Procedural Note   Date of Surgery: 07/06/2022  Surgeon:  Katha Cabal, MD.  Pre-operative Diagnosis: Atherosclerotic occlusive disease bilateral lower extremities with lifestyle limiting claudication and mild rest pain of the left lower extremity  Post-operative diagnosis:  Same  Procedure(s) Performed:             1.  Introduction catheter into left lower extremity 3rd order catheter placement.  Contrast injection left lower extremity for distal runoff              2.    Mechanical thrombectomy with the [place] Rex catheter left superficial femoral artery             3.  Percutaneous transluminal angioplasty and stent placement left superficial femoral artery              4.  Star close closure right common femoral arteriotomy  Anesthesia: Conscious sedation was administered under my direct supervision by the interventional radiology RN. IV Versed plus fentanyl were utilized. Continuous ECG, pulse oximetry and blood pressure was monitored throughout the entire procedure.  Conscious sedation was for a total of 1 hour 42 minutes.  Sheath: 7 [place] Ansell right common femoral retrograde  Contrast: 75 cc  Fluoroscopy Time: 20.3 minutes  Indications:  CONTRELL BALLENTINE presents with increasing pain of his left lower extremity.  Approximately 1 year ago he underwent revascularization with crosser atherectomy and then stent placement of the SFA.  Within the past few months he has developed increasing pain in his left lower extremity.  Physical examination as well as noninvasive studies support recurrence of his atherosclerosis.  Angiography with intervention is recommended.  The risks and benefits are reviewed all questions answered patient agrees to proceed.  Procedure:  DAYMON HORA is a 66 y.o. y.o. male who was identified and appropriate procedural time out was performed.  The patient was then placed supine on  the table and prepped and draped in the usual sterile fashion.    Ultrasound was placed in the sterile sleeve and the right groin was evaluated the right common femoral artery was echolucent and pulsatile indicating patency.  Image was recorded for the permanent record and under real-time visualization a microneedle was inserted into the common femoral artery microwire followed by a micro-sheath.  A J-wire was then advanced through the micro-sheath and a  5 [place] sheath was then inserted over a J-wire. J-wire was then advanced and a 5 French pigtail catheter was positioned at the level of T12. AP projection of the aorta was then obtained. Pigtail catheter was repositioned to above the bifurcation and a RAO view of the pelvis was obtained.  Subsequently a detail catheter with the stiff angle Glidewire was used to cross the aortic bifurcation the catheter wire were advanced down into the left distal external iliac artery. Oblique view of the femoral bifurcation was then obtained and subsequently the wire was reintroduced and the pigtail catheter negotiated into the SFA representing third order catheter placement. Distal runoff was then performed.  5000 units of heparin was then given and allowed to circulate and a 6 [place] Ansell sheath was advanced up and over the bifurcation and positioned in the femoral artery.  Later in the case the sheath was upsized to a [place].  KMP  catheter and stiff angle Glidewire were then negotiated down into the distal popliteal.  Distal runoff was then completed by hand injection through the catheter.  A V18 wire was then reintroduced through the Kum[place] catheter and the catheter removed.  The [place] Rex catheter was then prepped on the field.  Thrombectomy from the origin of the SFA to the level of Hunter's canal was then performed.  Approximately 2 passes were made.  Follow-up imaging demonstrated successful recanalization of the superficial femoral artery however there  were 4 locations along the stent that retained greater than 70% stenosis and appeared to be associated with stent fractures.  With the V18 wire in place a 5 mm x 30 cm Lutonix balloon was used to angioplasty the superficial femoral artery from Hunter's canal proximally.  A 6 mm x 80 mm Lutonix drug-eluting balloon was then used to treat the origin of the SFA.  Inflations for both these balloons were to 10 atm for approximately 1 minute.  I then selected a 7 mm x 25 cm Viabahn which was deployed across all 4 of the areas with obvious deformities of the previous stent.  Once the stent was deployed a 6 mm x 30 cm Viabahn was advanced across the stent and inflated to 12 atm for 1 full minute.  I then Magnant up the C arm and in a steep LAO imaged the femoral bifurcation.  I deployed a 7 mm x 40 mm life stent landing the stent approximately 1 mm into the common femoral.  This was then postdilated with a 6 mm x 60 mm Lutonix drug-eluting balloon inflated to 12 atm for 1 full minute.  Follow-up imaging now is demonstrated the origin was widely patent.  There was no impingement on the profunda femoris and down to the distal end of the Viabahn stent was widely patent.  Unfortunately there appeared to be significant residual stenosis below this level and I elected to cover this area with 1 more Viabahn stent.  A 7 mm x 100 mm Viabahn was deployed essentially completing the Viabahn across the length of the previous stent.  The entire leg was then reimaged from the groin the SFA is now widely patent with less than 10% residual stenosis throughout its length.  The popliteal remains widely patent.  There is two-vessel runoff to the foot via the tibioperoneal trunk "peroneal and posterior tibial.  The posterior tibial does have some moderate stenosis but is patent.  Anterior tibial occludes shortly after its origin.  After review of these images the sheath is pulled into the right patent.  RAO of the common femoral is  obtained and a Star close device deployed. There no immediate complications.   Findings:  The abdominal aorta is opacified with a bolus injection contrast. Renal arteries are there appears to be greater than 70% stenosis of the right renal artery at its origin left appears to be widely patent. The aorta itself has diffuse disease but no hemodynamically significant lesions. The common and external iliac arteries are widely patent on the right on the left the common iliac artery is widely patent.  There appears to be a moderate 40 to 50% stenosis of the origin of the left external iliac artery.  There is greater than 90% stenosis bilateral internal iliac arteries.  The left common femoral is widely patent as is the profunda femoris.  The SFA is occluded just a few millimeters distal to the origin.  Previously placed stents are noted and as described above there are several areas that appear to show stent fractures..  The popliteal demonstrates mild to moderate atherosclerosis and the trifurcation is heavily diseased with  occlusion of the anterior tibial artery.  The tibioperoneal trunk posterior tibial and peroneal are patent with the peroneal being the dominant runoff to the foot.  Moderate disease is noted within the posterior tibial  Following mechanical thrombectomy there is now reestablishment of a flow lumen.  Following angioplasty and stent placement of the SFA for the origin to Hunter's canal there is an excellent result with less than 10% residual stenosis.    Summary: Successful recanalization left lower extremity for limb salvage                        Disposition: Patient was taken to the recovery room in stable condition having tolerated the procedure well.  Katha Cabal 07/06/2022,4:12 PM

## 2022-07-06 NOTE — Progress Notes (Signed)
This RN asked MD if there was concern for bradycardia. MD instructed if patient stands without dizziness to continue to d/c. Also instructed pt to contact cardiologist tomorrow.

## 2022-07-07 ENCOUNTER — Encounter: Payer: Self-pay | Admitting: Vascular Surgery

## 2022-07-11 ENCOUNTER — Other Ambulatory Visit: Payer: Self-pay | Admitting: Dermatology

## 2022-07-11 ENCOUNTER — Telehealth (INDEPENDENT_AMBULATORY_CARE_PROVIDER_SITE_OTHER): Payer: Self-pay

## 2022-07-11 DIAGNOSIS — B353 Tinea pedis: Secondary | ICD-10-CM

## 2022-07-11 NOTE — Telephone Encounter (Signed)
Patient left a voicemail stating that he was having some swelling with left foot and ankle. Patient had left lower extremity on 07/06/22. I spoke with Dr Delana Meyer he recommended that the swelling some pain is to be expected.The patient should continue with elevating,wear compression stocking,take Tylenol or Ibuprofen for pain. Patient was made aware with medical advice and verbalized understanding.

## 2022-08-04 ENCOUNTER — Other Ambulatory Visit (INDEPENDENT_AMBULATORY_CARE_PROVIDER_SITE_OTHER): Payer: Self-pay | Admitting: Vascular Surgery

## 2022-08-04 DIAGNOSIS — Z95820 Peripheral vascular angioplasty status with implants and grafts: Secondary | ICD-10-CM

## 2022-08-04 DIAGNOSIS — I70212 Atherosclerosis of native arteries of extremities with intermittent claudication, left leg: Secondary | ICD-10-CM

## 2022-08-05 ENCOUNTER — Ambulatory Visit (INDEPENDENT_AMBULATORY_CARE_PROVIDER_SITE_OTHER): Payer: 59 | Admitting: Nurse Practitioner

## 2022-08-05 ENCOUNTER — Ambulatory Visit (INDEPENDENT_AMBULATORY_CARE_PROVIDER_SITE_OTHER): Payer: 59

## 2022-08-05 DIAGNOSIS — Z95820 Peripheral vascular angioplasty status with implants and grafts: Secondary | ICD-10-CM

## 2022-08-05 DIAGNOSIS — I70212 Atherosclerosis of native arteries of extremities with intermittent claudication, left leg: Secondary | ICD-10-CM | POA: Diagnosis not present

## 2022-09-12 ENCOUNTER — Encounter (INDEPENDENT_AMBULATORY_CARE_PROVIDER_SITE_OTHER): Payer: Self-pay

## 2022-09-27 ENCOUNTER — Other Ambulatory Visit: Payer: Self-pay | Admitting: Internal Medicine

## 2022-09-27 DIAGNOSIS — F17200 Nicotine dependence, unspecified, uncomplicated: Secondary | ICD-10-CM

## 2022-09-27 DIAGNOSIS — Z122 Encounter for screening for malignant neoplasm of respiratory organs: Secondary | ICD-10-CM

## 2022-09-27 DIAGNOSIS — F17210 Nicotine dependence, cigarettes, uncomplicated: Secondary | ICD-10-CM

## 2022-09-27 DIAGNOSIS — E1165 Type 2 diabetes mellitus with hyperglycemia: Secondary | ICD-10-CM

## 2022-10-05 ENCOUNTER — Ambulatory Visit
Admission: RE | Admit: 2022-10-05 | Discharge: 2022-10-05 | Disposition: A | Discharge status: Home / Self Care | Payer: 59 | Source: Ambulatory Visit | Attending: Internal Medicine | Admitting: Internal Medicine

## 2022-10-05 DIAGNOSIS — F17210 Nicotine dependence, cigarettes, uncomplicated: Secondary | ICD-10-CM

## 2022-10-05 DIAGNOSIS — E1165 Type 2 diabetes mellitus with hyperglycemia: Secondary | ICD-10-CM

## 2022-10-05 DIAGNOSIS — Z122 Encounter for screening for malignant neoplasm of respiratory organs: Secondary | ICD-10-CM

## 2022-10-05 DIAGNOSIS — F17200 Nicotine dependence, unspecified, uncomplicated: Secondary | ICD-10-CM

## 2022-10-14 ENCOUNTER — Other Ambulatory Visit (INDEPENDENT_AMBULATORY_CARE_PROVIDER_SITE_OTHER): Payer: Self-pay | Admitting: Vascular Surgery

## 2022-10-14 DIAGNOSIS — Z9889 Other specified postprocedural states: Secondary | ICD-10-CM

## 2022-10-17 ENCOUNTER — Ambulatory Visit (INDEPENDENT_AMBULATORY_CARE_PROVIDER_SITE_OTHER): Payer: 59

## 2022-10-17 DIAGNOSIS — Z9889 Other specified postprocedural states: Secondary | ICD-10-CM

## 2022-10-17 DIAGNOSIS — I739 Peripheral vascular disease, unspecified: Secondary | ICD-10-CM | POA: Diagnosis not present

## 2022-10-18 ENCOUNTER — Ambulatory Visit (INDEPENDENT_AMBULATORY_CARE_PROVIDER_SITE_OTHER): Payer: 59 | Admitting: Nurse Practitioner

## 2022-10-18 ENCOUNTER — Encounter (INDEPENDENT_AMBULATORY_CARE_PROVIDER_SITE_OTHER): Payer: Self-pay | Admitting: Nurse Practitioner

## 2022-10-18 VITALS — BP 137/72 | HR 60 | Resp 16 | Wt 214.0 lb

## 2022-10-18 DIAGNOSIS — E782 Mixed hyperlipidemia: Secondary | ICD-10-CM | POA: Diagnosis not present

## 2022-10-18 DIAGNOSIS — Z720 Tobacco use: Secondary | ICD-10-CM

## 2022-10-18 DIAGNOSIS — I10 Essential (primary) hypertension: Secondary | ICD-10-CM | POA: Diagnosis not present

## 2022-10-18 DIAGNOSIS — I70211 Atherosclerosis of native arteries of extremities with intermittent claudication, right leg: Secondary | ICD-10-CM

## 2022-10-18 DIAGNOSIS — E119 Type 2 diabetes mellitus without complications: Secondary | ICD-10-CM | POA: Diagnosis not present

## 2022-10-19 ENCOUNTER — Encounter (INDEPENDENT_AMBULATORY_CARE_PROVIDER_SITE_OTHER): Payer: Self-pay | Admitting: Nurse Practitioner

## 2022-10-19 NOTE — H&P (View-Only) (Signed)
Subjective:    Patient ID: Hector Garcia, male    DOB: Jul 02, 1956, 66 y.o.   MRN: [id number] Chief Complaint  Patient presents with   Follow-up    Ultrasound results    Hector Garcia is a 66 year old male that returns to the office for followup and review of the noninvasive studies.  He recently underwent intervention on his left lower extremity in August.  He notes that the symptoms in his left lower extremity are improved but he has been having worsening claudication-like symptoms in his right lower extremity.  The patient notes that there has been a significant deterioration in the lower extremity symptoms.  The patient notes interval shortening of their claudication distance and development of mild rest pain symptoms. No new ulcers or wounds have occurred since the last visit.  There have been no significant changes to the patient's overall health care.  The patient denies amaurosis fugax or recent TIA symptoms. There are no recent neurological changes noted. There is no history of DVT, PE or superficial thrombophlebitis. The patient denies recent episodes of angina or shortness of breath.   ABI's Rt=0.80 and Lt=1.08 (previous ABI's Rt=1.07 and Lt=1.07) Duplex US of the lower extremity arterial system shows the left lower extremity has primarily biphasic waveforms throughout with an open patent stent without significant stenosis.  However, the right lower extremity, has biphasic waveforms noted until the mid SFA where there is a noted 75 to 99% stenosis.  Based on the velocities this is likely more towards the higher end.    Review of Systems  Cardiovascular:        Claudication  All other systems reviewed and are negative.      Objective:   Physical Exam Vitals reviewed.  HENT:     Head: Normocephalic.  Cardiovascular:     Rate and Rhythm: Normal rate.     Pulses:          Dorsalis pedis pulses are detected w/ Doppler on the right side and 1+ on the left side.        Posterior tibial pulses are detected w/ Doppler on the right side and 1+ on the left side.  Pulmonary:     Effort: Pulmonary effort is normal.  Skin:    General: Skin is warm and dry.     Capillary Refill: Capillary refill takes 2 to 3 seconds.  Neurological:     Mental Status: He is alert and oriented to person, place, and time.  Psychiatric:        Mood and Affect: Mood normal.        Behavior: Behavior normal.        Thought Content: Thought content normal.        Judgment: Judgment normal.     BP 137/72 (BP Location: Left Arm)   Pulse 60   Resp 16   Wt 214 lb (97.1 kg)   BMI 29.02 kg/m   Past Medical History:  Diagnosis Date   CAD (coronary artery disease)    Diabetes mellitus without complication (HCC)    Hx of dysplastic nevus 11/28/2007   R lat abdomen ant side   Hx of dysplastic nevus 04/05/2016   R of midline sacral, mild   Hx of dysplastic nevus 07/02/2019   R sacral post waistline, severe atypia   Hx of dysplastic nevus 05/08/2019   L sacral post waistline, moderate atypia   Hx of melanoma in situ 04/09/2020   Left cheek, txted with [facility] Dr.  Merritt   Hyperlipidemia    Hypertension    Pemphigus     Social History   Socioeconomic History   Marital status: Divorced    Spouse name: Not on file   Number of children: Not on file   Years of education: Not on file   Highest education level: Not on file  Occupational History   Not on file  Tobacco Use   Smoking status: Some Days    Packs/day: 0.50    Types: Cigarettes    Last attempt to quit: 11/2016    Years since quitting: 5.9   Smokeless tobacco: Never   Tobacco comments:    may smoke 1 pack a week  Vaping Use   Vaping Use: Never used  Substance and Sexual Activity   Alcohol use: Yes    Alcohol/week: 1.0 standard drink of alcohol    Types: 1 Cans of beer per week    Comment: 1 beer weekly   Drug use: No   Sexual activity: Not on file  Other Topics Concern   Not on file  Social History  Narrative   Not on file   Social Determinants of Health   Financial Resource Strain: Not on file  Food Insecurity: Not on file  Transportation Needs: Not on file  Physical Activity: Not on file  Stress: Not on file  Social Connections: Not on file  Intimate Partner Violence: Not on file    Past Surgical History:  Procedure Laterality Date   CORONARY STENT INTERVENTION N/A 08/26/2019   Procedure: CORONARY STENT INTERVENTION;  Surgeon: Isaias Cowman, MD;  Location: [place] [facility];  Service: Cardiovascular;  Laterality: N/A;   fisture surgery  1979   LEFT HEART CATH AND CORONARY ANGIOGRAPHY Left 08/26/2019   Procedure: LEFT HEART CATH AND CORONARY ANGIOGRAPHY;  Surgeon: Isaias Cowman, MD;  Location: [place] [facility];  Service: Cardiovascular;  Laterality: Left;   LOWER EXTREMITY ANGIOGRAPHY Left 08/27/2021   Procedure: LOWER EXTREMITY ANGIOGRAPHY;  Surgeon: Katha Cabal, MD;  Location: [place] [facility];  Service: Cardiovascular;  Laterality: Left;   LOWER EXTREMITY ANGIOGRAPHY Left 07/06/2022   Procedure: Lower Extremity Angiography;  Surgeon: Katha Cabal, MD;  Location: [place] [facility];  Service: Cardiovascular;  Laterality: Left;   ROTATOR CUFF REPAIR     TRIGGER FINGER RELEASE     x several    Family History  Problem Relation Age of Onset   Benign prostatic hyperplasia Brother    Prostate cancer Neg Hx    Bladder Cancer Neg Hx    Kidney cancer Neg Hx     No Known Allergies     Latest Ref Rng & Units 08/27/2019    4:12 AM  CBC  WBC 4.0 - 10.5 K/uL 13.5   Hemoglobin 13.0 - 17.0 g/dL 13.0   Hematocrit 39.0 - 52.0 % 39.8   Platelets 150 - 400 K/uL 247       CMP     Component Value Date/Time   NA 137 08/27/2019 0412   K 4.1 08/27/2019 0412   CL 105 08/27/2019 0412   CO2 23 08/27/2019 0412   GLUCOSE 179 (H) 08/27/2019 0412   BUN 14 07/06/2022 1333   CREATININE 0.89 07/06/2022 1333   CALCIUM 8.9 08/27/2019 0412    GFRNONAA >60 07/06/2022 1333   GFRAA >60 08/27/2019 0412     No results found.     Assessment & Plan:   1. Atherosclerosis of native artery of right lower  extremity with intermittent claudication (HCC) Recommend:  The patient has experienced increased claudication symptoms and is now describing lifestyle limiting claudication and appears to be having mild rest pain symptroms.  Given the severity of the patient's severe right lower extremity symptoms the patient should undergo angiography with the hope for intervention.  Risk and benefits were reviewed the patient.  Indications for the procedure were reviewed.  All questions were answered, the patient agrees to proceed with right lower extremity angiography and possible intervention.   The patient should continue walking and begin a more formal exercise program.  The patient should continue antiplatelet therapy and aggressive treatment of the lipid abnormalities  The patient will follow up with me after the angiogram.   2. Hyperlipidemia, mixed Continue statin as ordered and reviewed, no changes at this time  3. Controlled type 2 diabetes mellitus without complication, without long-term current use of insulin (HCC) Continue hypoglycemic medications as already ordered, these medications have been reviewed and there are no changes at this time.  Hgb A1C to be monitored as already arranged by primary service  4. Benign essential hypertension Continue antihypertensive medications as already ordered, these medications have been reviewed and there are no changes at this time.  5. Tobacco abuse Smoking cessation was discussed, 3-10 minutes spent on this topic specifically   Current Outpatient Medications on File Prior to Visit  Medication Sig Dispense Refill   amLODipine (NORVASC) 10 MG tablet Take 10 mg by mouth daily.      aspirin EC 81 MG tablet Take 81 mg by mouth at bedtime.     clopidogrel (PLAVIX) 75 MG tablet Take 1 tablet  (75 mg total) by mouth daily. 90 tablet 1   Flaxseed, Linseed, (BIO-FLAX) 1000 MG CAPS Take 1,000 mg by mouth daily.     gabapentin (NEURONTIN) 300 MG capsule 1 capsule by mouth 3 times daily     glimepiride (AMARYL) 2 MG tablet Take 2 mg by mouth daily.     ketoconazole (NIZORAL) 2 % cream Apply 1 application topically at bedtime. qhs to feet 60 g 3   lisinopril-hydrochlorothiazide (ZESTORETIC) 20-25 MG tablet Take 1 tablet by mouth daily.     metFORMIN (GLUCOPHAGE) 500 MG tablet Take 500 mg by mouth 2 (two) times daily with a meal.     metoprolol succinate (TOPROL-XL) 25 MG 24 hr tablet Take 0.5 tablets (12.5 mg total) by mouth daily. (Patient taking differently: Take 25 mg by mouth daily.) 30 tablet 11   niacin (NIASPAN) 500 MG CR tablet Take 500 mg by mouth at bedtime.     nitroGLYCERIN (NITROSTAT) 0.4 MG SL tablet Place 0.4 mg under the tongue every 5 (five) minutes as needed for chest pain.     Omega-3 Fatty Acids (FISH OIL) 1000 MG CPDR Take 1,000 mg by mouth daily.     pantoprazole (PROTONIX) 40 MG tablet Take 1 tablet (40 mg total) by mouth daily. Additional refills per patient's primary service 30 tablet 5   Phenylephrine-Pheniramine (DRISTAN NA) Place 1 spray into the nose daily as needed (allergies).     sildenafil (VIAGRA) 100 MG tablet Take 100 mg by mouth as needed for erectile dysfunction.     terbinafine (LAMISIL) 250 MG tablet Take 1 tablet (250 mg total) by mouth daily. 30 tablet 0   vitamin C (ASCORBIC ACID) 500 MG tablet Take 500 mg by mouth daily.     zolpidem (AMBIEN) 10 MG tablet Take 5 mg by mouth at bedtime as needed for  sleep.     rosuvastatin (CRESTOR) 20 MG tablet Take 20 mg by mouth at bedtime. (Patient not taking: Reported on 07/06/2022)     No current facility-administered medications on file prior to visit.    There are no Patient Instructions on file for this visit. No follow-ups on file.   Kris Hartmann, NP

## 2022-10-19 NOTE — Progress Notes (Signed)
Subjective:    Patient ID: Hector Garcia, male    DOB: September 06, 1956, 66 y.o.   MRN: [id number] Chief Complaint  Patient presents with  . Follow-up    Ultrasound results    HPI  Review of Systems     Objective:   Physical Exam  BP 137/72 (BP Location: Left Arm)   Pulse 60   Resp 16   Wt 214 lb (97.1 kg)   BMI 29.02 kg/m   Past Medical History:  Diagnosis Date  . CAD (coronary artery disease)   . Diabetes mellitus without complication (HCC)   . Hx of dysplastic nevus 11/28/2007   R lat abdomen ant side  . Hx of dysplastic nevus 04/05/2016   R of midline sacral, mild  . Hx of dysplastic nevus 07/02/2019   R sacral post waistline, severe atypia  . Hx of dysplastic nevus 05/08/2019   L sacral post waistline, moderate atypia  . Hx of melanoma in situ 04/09/2020   Left cheek, txted with [facility] Dr. Lorn Junes  . Hyperlipidemia   . Hypertension   . Pemphigus     Social History   Socioeconomic History  . Marital status: Divorced    Spouse name: Not on file  . Number of children: Not on file  . Years of education: Not on file  . Highest education level: Not on file  Occupational History  . Not on file  Tobacco Use  . Smoking status: Some Days    Packs/day: 0.50    Types: Cigarettes    Last attempt to quit: 11/2016    Years since quitting: 5.9  . Smokeless tobacco: Never  . Tobacco comments:    may smoke 1 pack a week  Vaping Use  . Vaping Use: Never used  Substance and Sexual Activity  . Alcohol use: Yes    Alcohol/week: 1.0 standard drink of alcohol    Types: 1 Cans of beer per week    Comment: 1 beer weekly  . Drug use: No  . Sexual activity: Not on file  Other Topics Concern  . Not on file  Social History Narrative  . Not on file   Social Determinants of Health   Financial Resource Strain: Not on file  Food Insecurity: Not on file  Transportation Needs: Not on file  Physical Activity: Not on file  Stress: Not on file  Social Connections: Not  on file  Intimate Partner Violence: Not on file    Past Surgical History:  Procedure Laterality Date  . CORONARY STENT INTERVENTION N/A 08/26/2019   Procedure: CORONARY STENT INTERVENTION;  Surgeon: Marcina Millard, MD;  Location: [facility];  Service: Cardiovascular;  Laterality: N/A;  . fisture surgery  1979  . LEFT HEART CATH AND CORONARY ANGIOGRAPHY Left 08/26/2019   Procedure: LEFT HEART CATH AND CORONARY ANGIOGRAPHY;  Surgeon: Marcina Millard, MD;  Location: [facility];  Service: Cardiovascular;  Laterality: Left;  . LOWER EXTREMITY ANGIOGRAPHY Left 08/27/2021   Procedure: LOWER EXTREMITY ANGIOGRAPHY;  Surgeon: Renford Dills, MD;  Location: [facility];  Service: Cardiovascular;  Laterality: Left;  . LOWER EXTREMITY ANGIOGRAPHY Left 07/06/2022   Procedure: Lower Extremity Angiography;  Surgeon: Renford Dills, MD;  Location: [facility];  Service: Cardiovascular;  Laterality: Left;  . ROTATOR CUFF REPAIR    . TRIGGER FINGER RELEASE     x several    Family History  Problem Relation Age of Onset  . Benign prostatic hyperplasia  Brother   . Prostate cancer Neg Hx   . Bladder Cancer Neg Hx   . Kidney cancer Neg Hx     No Known Allergies     Latest Ref Rng & Units 08/27/2019    4:12 AM  CBC  WBC 4.0 - 10.5 K/uL 13.5   Hemoglobin 13.0 - 17.0 g/dL 29.5   Hematocrit 18.8 - 52.0 % 39.8   Platelets 150 - 400 K/uL 247       CMP     Component Value Date/Time   NA 137 08/27/2019 0412   K 4.1 08/27/2019 0412   CL 105 08/27/2019 0412   CO2 23 08/27/2019 0412   GLUCOSE 179 (H) 08/27/2019 0412   BUN 14 07/06/2022 1333   CREATININE 0.89 07/06/2022 1333   CALCIUM 8.9 08/27/2019 0412   GFRNONAA >60 07/06/2022 1333   GFRAA >60 08/27/2019 0412     No results found.     Assessment & Plan:   1. Atherosclerosis of native artery of right lower extremity with intermittent claudication (HCC) ***  2. Hyperlipidemia,  mixed ***  3. Controlled type 2 diabetes mellitus without complication, without long-term current use of insulin (HCC) ***  4. Benign essential hypertension ***  5. Tobacco abuse ***   Current Outpatient Medications on File Prior to Visit  Medication Sig Dispense Refill  . amLODipine (NORVASC) 10 MG tablet Take 10 mg by mouth daily.     Marland Kitchen aspirin EC 81 MG tablet Take 81 mg by mouth at bedtime.    . clopidogrel (PLAVIX) 75 MG tablet Take 1 tablet (75 mg total) by mouth daily. 90 tablet 1  . Flaxseed, Linseed, (BIO-FLAX) 1000 MG CAPS Take 1,000 mg by mouth daily.    Marland Kitchen gabapentin (NEURONTIN) 300 MG capsule 1 capsule by mouth 3 times daily    . glimepiride (AMARYL) 2 MG tablet Take 2 mg by mouth daily.    Marland Kitchen ketoconazole (NIZORAL) 2 % cream Apply 1 application topically at bedtime. qhs to feet 60 g 3  . lisinopril-hydrochlorothiazide (ZESTORETIC) 20-25 MG tablet Take 1 tablet by mouth daily.    . metFORMIN (GLUCOPHAGE) 500 MG tablet Take 500 mg by mouth 2 (two) times daily with a meal.    . metoprolol succinate (TOPROL-XL) 25 MG 24 hr tablet Take 0.5 tablets (12.5 mg total) by mouth daily. (Patient taking differently: Take 25 mg by mouth daily.) 30 tablet 11  . niacin (NIASPAN) 500 MG CR tablet Take 500 mg by mouth at bedtime.    . nitroGLYCERIN (NITROSTAT) 0.4 MG SL tablet Place 0.4 mg under the tongue every 5 (five) minutes as needed for chest pain.    . Omega-3 Fatty Acids (FISH OIL) 1000 MG CPDR Take 1,000 mg by mouth daily.    . pantoprazole (PROTONIX) 40 MG tablet Take 1 tablet (40 mg total) by mouth daily. Additional refills per patient's primary service 30 tablet 5  . Phenylephrine-Pheniramine (DRISTAN NA) Place 1 spray into the nose daily as needed (allergies).    . sildenafil (VIAGRA) 100 MG tablet Take 100 mg by mouth as needed for erectile dysfunction.    . terbinafine (LAMISIL) 250 MG tablet Take 1 tablet (250 mg total) by mouth daily. 30 tablet 0  . vitamin C (ASCORBIC ACID)  500 MG tablet Take 500 mg by mouth daily.    Marland Kitchen zolpidem (AMBIEN) 10 MG tablet Take 5 mg by mouth at bedtime as needed for sleep.    . rosuvastatin (CRESTOR) 20 MG tablet  Take 20 mg by mouth at bedtime. (Patient not taking: Reported on 07/06/2022)     No current facility-administered medications on file prior to visit.    There are no Patient Instructions on file for this visit. No follow-ups on file.   Georgiana Spinner, NP

## 2022-10-28 ENCOUNTER — Ambulatory Visit (INDEPENDENT_AMBULATORY_CARE_PROVIDER_SITE_OTHER): Payer: 59 | Admitting: Vascular Surgery

## 2022-10-28 ENCOUNTER — Encounter (INDEPENDENT_AMBULATORY_CARE_PROVIDER_SITE_OTHER): Payer: 59

## 2022-10-31 ENCOUNTER — Telehealth (INDEPENDENT_AMBULATORY_CARE_PROVIDER_SITE_OTHER): Payer: Self-pay

## 2022-10-31 NOTE — Telephone Encounter (Signed)
Spoke with the patient and advisef him of a different time to [facility] am for his RLE angio with Dr. Delana Meyer.

## 2022-11-01 ENCOUNTER — Telehealth (INDEPENDENT_AMBULATORY_CARE_PROVIDER_SITE_OTHER): Payer: Self-pay

## 2022-11-01 ENCOUNTER — Encounter
Admission: RE | Disposition: A | Discharge status: Home / Self Care | Payer: Self-pay | Source: Home / Self Care | Attending: Vascular Surgery

## 2022-11-01 ENCOUNTER — Ambulatory Visit
Admission: RE | Admit: 2022-11-01 | Discharge: 2022-11-01 | Disposition: A | Discharge status: Home / Self Care | Payer: 59 | Attending: Vascular Surgery | Admitting: Vascular Surgery

## 2022-11-01 DIAGNOSIS — I70219 Atherosclerosis of native arteries of extremities with intermittent claudication, unspecified extremity: Secondary | ICD-10-CM

## 2022-11-01 SURGERY — LOWER EXTREMITY ANGIOGRAPHY
Anesthesia: Moderate Sedation | Site: Leg Lower | Laterality: Right

## 2022-11-01 MED ORDER — ONDANSETRON HCL 4 MG/2ML IJ SOLN: 4.0000 mg | Freq: Four times a day (QID) | INTRAMUSCULAR | Status: DC | PRN

## 2022-11-01 MED ORDER — DIPHENHYDRAMINE HCL 50 MG/ML IJ SOLN: 50.0000 mg | Freq: Once | INTRAMUSCULAR | Status: DC | PRN

## 2022-11-01 MED ORDER — METHYLPREDNISOLONE SODIUM SUCC 125 MG IJ SOLR: 125.0000 mg | Freq: Once | INTRAMUSCULAR | Status: DC | PRN

## 2022-11-01 MED ORDER — HYDROMORPHONE HCL 1 MG/ML IJ SOLN: 1.0000 mg | Freq: Once | INTRAMUSCULAR | Status: DC | PRN

## 2022-11-01 MED ORDER — SODIUM CHLORIDE 0.9 % IV SOLN: INTRAVENOUS | Status: DC

## 2022-11-01 MED ORDER — FAMOTIDINE 20 MG PO TABS: 40.0000 mg | ORAL_TABLET | Freq: Once | ORAL | Status: DC | PRN

## 2022-11-01 MED ORDER — CEFAZOLIN SODIUM-DEXTROSE 2-4 GM/100ML-% IV SOLN: 2.0000 g | INTRAVENOUS | Status: DC

## 2022-11-01 MED ORDER — MIDAZOLAM HCL 2 MG/ML PO SYRP: 8.0000 mg | ORAL_SOLUTION | Freq: Once | ORAL | Status: DC | PRN

## 2022-11-01 NOTE — Telephone Encounter (Signed)
Spoke with the patient yesterday and explained about getting the prior Auth and that it was taking unusually long to do so, patient stated he understood. I advised yesterday that I would call when I had the prior Auth to let him know to go to the hospital. I received prior Auth today at 10:45 am and called the patient to let him know he will be scheduled with Dr. Delana Meyer on 11/02/22 with a 12:00 pm arrival time to [facility]. I did speak with the patient this morning to let him know I was still working on the prior Auth and he stated he was already at the hospital. I stayed on hold with the insurance company a total of 2 hours and 45 min's attempting to get this authorization starting at 7:30 am this morning. I have been in the process of getting this Auth since 10/26/22.

## 2022-11-01 NOTE — Progress Notes (Signed)
Pt. Informed that procedure not able to be done today. MD office to phone pt. For reschedule date.

## 2022-11-01 NOTE — Progress Notes (Signed)
Holding pt. In pre-op area "pending" insurance approval for Dr. Delana Meyer. Pt. States he was already approved for Dr. Lucky Cowboy, but that insurance was double checking about approval for Dr. Delana Meyer for today.

## 2022-11-02 ENCOUNTER — Encounter: Payer: Self-pay | Admitting: Vascular Surgery

## 2022-11-02 ENCOUNTER — Ambulatory Visit
Admission: RE | Admit: 2022-11-02 | Discharge: 2022-11-02 | Disposition: A | Discharge status: Home / Self Care | Payer: 59 | Attending: Vascular Surgery | Admitting: Vascular Surgery

## 2022-11-02 ENCOUNTER — Encounter
Admission: RE | Disposition: A | Discharge status: Home / Self Care | Payer: Self-pay | Source: Home / Self Care | Attending: Vascular Surgery

## 2022-11-02 ENCOUNTER — Ambulatory Visit: Admit: 2022-11-02 | Payer: 59 | Admitting: Vascular Surgery

## 2022-11-02 DIAGNOSIS — E1151 Type 2 diabetes mellitus with diabetic peripheral angiopathy without gangrene: Secondary | ICD-10-CM | POA: Insufficient documentation

## 2022-11-02 DIAGNOSIS — I700 Atherosclerosis of aorta: Secondary | ICD-10-CM | POA: Diagnosis not present

## 2022-11-02 DIAGNOSIS — I10 Essential (primary) hypertension: Secondary | ICD-10-CM | POA: Insufficient documentation

## 2022-11-02 DIAGNOSIS — I70221 Atherosclerosis of native arteries of extremities with rest pain, right leg: Secondary | ICD-10-CM | POA: Diagnosis present

## 2022-11-02 DIAGNOSIS — F17210 Nicotine dependence, cigarettes, uncomplicated: Secondary | ICD-10-CM | POA: Insufficient documentation

## 2022-11-02 DIAGNOSIS — I701 Atherosclerosis of renal artery: Secondary | ICD-10-CM

## 2022-11-02 DIAGNOSIS — Z7984 Long term (current) use of oral hypoglycemic drugs: Secondary | ICD-10-CM | POA: Insufficient documentation

## 2022-11-02 DIAGNOSIS — E782 Mixed hyperlipidemia: Secondary | ICD-10-CM | POA: Insufficient documentation

## 2022-11-02 DIAGNOSIS — I70219 Atherosclerosis of native arteries of extremities with intermittent claudication, unspecified extremity: Secondary | ICD-10-CM

## 2022-11-02 HISTORY — PX: LOWER EXTREMITY ANGIOGRAPHY: CATH118251

## 2022-11-02 LAB — BUN: BUN: 22 mg/dL (ref 8–23)

## 2022-11-02 LAB — CREATININE, SERUM
Creatinine, Ser: 0.92 mg/dL (ref 0.61–1.24)
GFR, Estimated: >60 mL/min (ref >60)

## 2022-11-02 SURGERY — LOWER EXTREMITY ANGIOGRAPHY
Anesthesia: Moderate Sedation | Site: Leg Lower | Laterality: Right

## 2022-11-02 MED ORDER — CEFAZOLIN SODIUM-DEXTROSE 2-4 GM/100ML-% IV SOLN: 2.0000 g | INTRAVENOUS | Status: DC

## 2022-11-02 MED ORDER — HEPARIN SODIUM (PORCINE) 1000 UNIT/ML IJ SOLN
INTRAMUSCULAR | Status: AC
  Filled 2022-11-02: qty 10

## 2022-11-02 MED ORDER — OXYCODONE HCL 5 MG PO TABS: 5.0000 mg | ORAL_TABLET | ORAL | Status: DC | PRN

## 2022-11-02 MED ORDER — FENTANYL CITRATE (PF) 100 MCG/2ML IJ SOLN
INTRAMUSCULAR | Status: AC
  Filled 2022-11-02: qty 2

## 2022-11-02 MED ORDER — ONDANSETRON HCL 4 MG/2ML IJ SOLN: 4.0000 mg | Freq: Four times a day (QID) | INTRAMUSCULAR | Status: DC | PRN

## 2022-11-02 MED ORDER — SODIUM CHLORIDE 0.9 % IV SOLN: INTRAVENOUS | Status: DC

## 2022-11-02 MED ORDER — HYDRALAZINE HCL 20 MG/ML IJ SOLN: 5.0000 mg | INTRAMUSCULAR | Status: DC | PRN

## 2022-11-02 MED ORDER — SODIUM CHLORIDE 0.9% FLUSH: 3.0000 mL | INTRAVENOUS | Status: DC | PRN

## 2022-11-02 MED ORDER — METHYLPREDNISOLONE SODIUM SUCC 125 MG IJ SOLR: 125.0000 mg | Freq: Once | INTRAMUSCULAR | Status: DC | PRN

## 2022-11-02 MED ORDER — SODIUM CHLORIDE 0.9% FLUSH: 3.0000 mL | Freq: Two times a day (BID) | INTRAVENOUS | Status: DC

## 2022-11-02 MED ORDER — FENTANYL CITRATE (PF) 100 MCG/2ML IJ SOLN
INTRAMUSCULAR | Status: DC | PRN
  Administered 2022-11-02: 50 ug via INTRAVENOUS
  Administered 2022-11-02 (×2): 25 ug via INTRAVENOUS

## 2022-11-02 MED ORDER — DIPHENHYDRAMINE HCL 50 MG/ML IJ SOLN: 50.0000 mg | Freq: Once | INTRAMUSCULAR | Status: DC | PRN

## 2022-11-02 MED ORDER — CEFAZOLIN SODIUM-DEXTROSE 1-4 GM/50ML-% IV SOLN
INTRAVENOUS | Status: DC | PRN
  Administered 2022-11-02: 2 g via INTRAVENOUS

## 2022-11-02 MED ORDER — MIDAZOLAM HCL 2 MG/ML PO SYRP: 8.0000 mg | ORAL_SOLUTION | Freq: Once | ORAL | Status: DC | PRN

## 2022-11-02 MED ORDER — ACETAMINOPHEN 325 MG PO TABS: 650.0000 mg | ORAL_TABLET | ORAL | Status: DC | PRN

## 2022-11-02 MED ORDER — SODIUM CHLORIDE 0.9 % IV SOLN: 250.0000 mL | INTRAVENOUS | Status: DC | PRN

## 2022-11-02 MED ORDER — HYDROMORPHONE HCL 1 MG/ML IJ SOLN: 1.0000 mg | Freq: Once | INTRAMUSCULAR | Status: DC | PRN

## 2022-11-02 MED ORDER — MIDAZOLAM HCL 2 MG/2ML IJ SOLN
INTRAMUSCULAR | Status: DC | PRN
  Administered 2022-11-02 (×2): 1 mg via INTRAVENOUS
  Administered 2022-11-02: 2 mg via INTRAVENOUS

## 2022-11-02 MED ORDER — MORPHINE SULFATE (PF) 4 MG/ML IV SOLN: 2.0000 mg | INTRAVENOUS | Status: DC | PRN

## 2022-11-02 MED ORDER — MIDAZOLAM HCL 5 MG/5ML IJ SOLN
INTRAMUSCULAR | Status: AC
  Filled 2022-11-02: qty 5

## 2022-11-02 MED ORDER — LABETALOL HCL 5 MG/ML IV SOLN: 10.0000 mg | INTRAVENOUS | Status: DC | PRN

## 2022-11-02 MED ORDER — HEPARIN SODIUM (PORCINE) 1000 UNIT/ML IJ SOLN
INTRAMUSCULAR | Status: DC | PRN
  Administered 2022-11-02: 6000 [IU] via INTRAVENOUS

## 2022-11-02 MED ORDER — IODIXANOL 320 MG/ML IV SOLN
INTRAVENOUS | Status: DC | PRN
  Administered 2022-11-02: 75 mL

## 2022-11-02 MED ORDER — CEFAZOLIN SODIUM-DEXTROSE 2-4 GM/100ML-% IV SOLN
INTRAVENOUS | Status: AC
  Filled 2022-11-02: qty 100

## 2022-11-02 MED ORDER — FAMOTIDINE 20 MG PO TABS: 40.0000 mg | ORAL_TABLET | Freq: Once | ORAL | Status: DC | PRN

## 2022-11-02 SURGICAL SUPPLY — 23 items
BALLN LUTONIX DCB 4X80X130 (BALLOONS) ×1
BALLN LUTONIX DCB 6X100X130 (BALLOONS) ×1
BALLN LUTONIX DCB 6X40X130 (BALLOONS) ×1
BALLOON LUTONIX DCB 4X80X130 (BALLOONS) IMPLANT
BALLOON LUTONIX DCB 6X100X130 (BALLOONS) IMPLANT
BALLOON LUTONIX DCB 6X40X130 (BALLOONS) IMPLANT
CATH OMNI FLUSH 5F 65CM (CATHETERS) IMPLANT
COVER PROBE ULTRASOUND 5X96 (MISCELLANEOUS) IMPLANT
DEVICE STARCLOSE SE CLOSURE (Vascular Products) IMPLANT
GLIDEWIRE ADV .035X260CM (WIRE) IMPLANT
GOWN STRL REUS W/ TWL LRG LVL3 (GOWN DISPOSABLE) ×1 IMPLANT
GOWN STRL REUS W/TWL LRG LVL3 (GOWN DISPOSABLE) ×1
KIT ENCORE 26 ADVANTAGE (KITS) IMPLANT
NDL ENTRY 21GA 7CM ECHOTIP (NEEDLE) IMPLANT
NEEDLE ENTRY 21GA 7CM ECHOTIP (NEEDLE) ×1 IMPLANT
PACK ANGIOGRAPHY (CUSTOM PROCEDURE TRAY) ×1 IMPLANT
SET INTRO CAPELLA COAXIAL (SET/KITS/TRAYS/PACK) IMPLANT
SHEATH BRITE TIP 5FRX11 (SHEATH) IMPLANT
SHEATH RAABE 6FR (SHEATH) IMPLANT
STENT LIFESTENT 5F 7X100X135 (Permanent Stent) IMPLANT
SYR MEDRAD MARK 7 150ML (SYRINGE) IMPLANT
TUBING CONTRAST HIGH PRESS 72 (TUBING) IMPLANT
WIRE GUIDERIGHT .035X150 (WIRE) IMPLANT

## 2022-11-02 NOTE — Op Note (Signed)
[facility]  Percutaneous Study/Intervention Procedural Note   Date of Surgery: 11/02/2022  Surgeon:  Katha Cabal, MD.  Pre-operative Diagnosis: Atherosclerotic occlusive disease bilateral lower extremities with lifestyle limiting claudication and rest pain symptoms of the right lower extremity  Post-operative diagnosis:  Same  Procedure(s) Performed:             1.  Introduction catheter into right lower extremity 3rd order catheter placement              2.    Contrast injection right lower extremity for distal runoff             3.  Percutaneous transluminal angioplasty and stent placement right superficial femoral artery             4.  Star close closure left common femoral arteriotomy  Anesthesia: Conscious sedation was administered under my direct supervision by the interventional radiology RN. IV Versed plus fentanyl were utilized. Continuous ECG, pulse oximetry and blood pressure was monitored throughout the entire procedure.  Conscious sedation was for a total of 46 minutes and 28 seconds.   Sheath: 6 [place] Rabie left common femoral retrograde  Contrast: 75 cc  Fluoroscopy Time: 5.7 minutes  Indications:  Hector Garcia presents with increasing pain of his right lower extremity.  Noninvasive studies as well as physical examination demonstrated deterioration in his arterial status of the right lower extremity.  This places him at increased risk for limb loss.  Angiography is recommended with the hope for intervention for limb salvage.  The risks and benefits are reviewed all questions answered patient agrees to proceed.  Procedure:  Hector Garcia is a 66 y.o. y.o. male who was identified and appropriate procedural time out was performed.  The patient was then placed supine on the table and prepped and draped in the usual sterile fashion.    Ultrasound was placed in the sterile sleeve and the left groin was evaluated the left common  femoral artery was echolucent and pulsatile indicating patency.  Image was recorded for the permanent record and under real-time visualization a microneedle was inserted into the common femoral artery microwire followed by a micro-sheath.  A J-wire was then advanced through the micro-sheath and a  5 [place] sheath was then inserted over a J-wire. J-wire was then advanced and a 5 French Omni Flush catheter was positioned at the level of T12. AP projection of the aorta was then obtained.  Omni Flush catheter was repositioned to above the bifurcation and a LAO view of the pelvis was obtained.  Subsequently the Omni Flush catheter with the stiff angle Glidewire was used to cross the aortic bifurcation the catheter wire were advanced down into the RAO distal external iliac artery. Oblique view of the femoral bifurcation was then obtained and subsequently the wire was reintroduced and the pigtail catheter negotiated into the SFA representing third order catheter placement. Distal runoff was then performed.  5000 units of heparin was then given and allowed to circulate and a 6 [place] Rabie sheath was advanced up and over the bifurcation and positioned in the femoral artery  The advantage Glidewire were then negotiated down into the distal popliteal.  A 4 mm x 80 mm Lutonix balloon was used to angioplasty the superficial femoral artery in its midportion. Inflation was to 10 atmospheres for 1 minute. Follow-up imaging demonstrated greater than 50% residual stenosis.  A 7 mm x 100 mm life stent was deployed and subsequently  postdilated with a 6 mm x 100 mm Lutonix drug-eluting balloon inflated to 8 atm for 1 minute.  The detector was then repositioned to the femoral bifurcation.  An area of abnormality about 2 cm distal to the origin was reidentified.  A 6 mm x 40 mm Lutonix drug-eluting balloon was advanced across this lesion and inflated to 10 atm for 1 full minute.  Follow-up imaging demonstrated less than 10% residual  stenosis.  Distal runoff was then reassessed beginning at the distal common femoral and imaging down to the ankle.  After review of these images the sheath is pulled into the left external iliac oblique of the common femoral is obtained and a Star close device deployed. There no immediate complications.   Findings:  The abdominal aorta is opacified with a bolus injection contrast. Renal arteries are single there is moderate left renal artery stenosis but this does not appear to be hemodynamically significant at this time. The aorta itself has diffuse disease but no hemodynamically significant lesions. The common and external iliac arteries are widely patent bilaterally.  The right common femoral is widely patent as is the profunda femoris.  The SFA does indeed have a significant stenosis approximately 2 cm distal to the origin there is a focal napkin ring like lesion of approximately 60 to 70%.  Beginning in the midportion there is diffuse disease with a focal area of greater than 90% stenosis.  The area of high-grade focal stenosis extends over a distance of 30 to 40 mm in length.  The popliteal demonstrates mild disease without hemodynamically significant stenosis.  The trifurcation is heavily diseased with occlusion of the anterior tibial and posterior tibial arteries the dominant runoff is the peroneal which is widely patent down to the ankle where collaterals fill the distal posterior tibial and plantar arteries  Following angioplasty and stent placement the SFA is now widely patent with less than 10% residual stenosis.  Distal runoff is preserved.   Summary: Successful recanalization right lower extremity for limb salvage                        Disposition: Patient was taken to the recovery room in stable condition having tolerated the procedure well.  Hector Garcia, Hector Garcia 11/02/2022,2:45 PM

## 2022-11-02 NOTE — Interval H&P Note (Signed)
History and Physical Interval Note:  11/02/2022 12:17 PM  Hector Garcia  has presented today for surgery, with the diagnosis of RLE Angio  BARD    ASO w claudication.  The various methods of treatment have been discussed with the patient and family. After consideration of risks, benefits and other options for treatment, the patient has consented to  Procedure(s): Lower Extremity Angiography (Right) as a surgical intervention.  The patient's history has been reviewed, patient examined, no change in status, stable for surgery.  I have reviewed the patient's chart and labs.  Questions were answered to the patient's satisfaction.     Hortencia Pilar

## 2022-11-03 ENCOUNTER — Encounter: Payer: Self-pay | Admitting: Vascular Surgery

## 2022-11-09 LAB — GLUCOSE, CAPILLARY
Glucose-Capillary: 171 mg/dL — ABNORMAL HIGH (ref 70–99)
Glucose-Capillary: 122 mg/dL — ABNORMAL HIGH (ref 70–99)

## 2022-11-23 ENCOUNTER — Other Ambulatory Visit (INDEPENDENT_AMBULATORY_CARE_PROVIDER_SITE_OTHER): Payer: Self-pay | Admitting: Vascular Surgery

## 2022-11-23 DIAGNOSIS — I739 Peripheral vascular disease, unspecified: Secondary | ICD-10-CM

## 2022-11-24 ENCOUNTER — Encounter: Payer: Self-pay | Admitting: Urology

## 2022-11-24 ENCOUNTER — Ambulatory Visit: Payer: 59 | Admitting: Urology

## 2022-11-24 VITALS — BP 164/77 | HR 69 | Ht 72.0 in | Wt 198.0 lb

## 2022-11-24 DIAGNOSIS — Z125 Encounter for screening for malignant neoplasm of prostate: Secondary | ICD-10-CM

## 2022-11-24 DIAGNOSIS — N529 Male erectile dysfunction, unspecified: Secondary | ICD-10-CM

## 2022-11-24 MED ORDER — TADALAFIL 20 MG PO TABS: 20.0000 mg | ORAL_TABLET | ORAL | 3 refills | Status: DC

## 2022-11-24 NOTE — Patient Instructions (Addendum)
Cialis was sent to [email or web address] online [facility].  Go to the website and set up a free account, and they should already have your prescription information.  This could be taken as needed at least an hour prior to sexual activity, or every other day and the medication will always be in your system.  Do not mix this with the sildenafil, or nitrates for chest pain.  Erectile Dysfunction Erectile dysfunction (ED) is the inability to get or keep an erection in order to have sexual intercourse. ED is considered a symptom of an underlying disorder and is not considered a disease. ED may include: Inability to get an erection. Lack of enough hardness of the erection to allow penetration. Loss of erection before sex is finished. What are the causes? This condition may be caused by: Physical causes, such as: Artery problems. This may include heart disease, high blood pressure, atherosclerosis, and diabetes. Hormonal problems, such as low testosterone. Obesity. Nerve problems. This may include back or pelvic injuries, multiple sclerosis, Parkinson's disease, spinal cord injury, and stroke. Certain medicines, such as: Pain relievers. Antidepressants. Blood pressure medicines and water pills (diuretics). Cancer medicines. Antihistamines. Muscle relaxants. Lifestyle factors, such as: Use of drugs such as marijuana, cocaine, or opioids. Excessive use of alcohol. Smoking. Lack of physical activity or exercise. Psychological causes, such as: Anxiety or stress. Sadness or depression. Exhaustion. Fear about sexual performance. Guilt. What are the signs or symptoms? Symptoms of this condition include: Inability to get an erection. Lack of enough hardness of the erection to allow penetration. Loss of the erection before sex is finished. Sometimes having normal erections, but with frequent unsatisfactory episodes. Low sexual satisfaction in either partner due to erection problems. A curved penis  occurring with erection. The curve may cause pain, or the penis may be too curved to allow for intercourse. Never having nighttime or morning erections. How is this diagnosed? This condition is often diagnosed by: Performing a physical exam to find other diseases or specific problems with the penis. Asking you detailed questions about the problem. Doing tests, such as: Blood tests to check for diabetes mellitus or high cholesterol, or to measure hormone levels. Other tests to check for underlying health conditions. An ultrasound exam to check for scarring. A test to check blood flow to the penis. Doing a sleep study at home to measure nighttime erections. How is this treated? This condition may be treated by: Medicines, such as: Medicine taken by mouth to help you achieve an erection (oral medicine). Hormone replacement therapy to replace low testosterone levels. Medicine that is injected into the penis. Your health care provider may instruct you how to give yourself these injections at home. Medicine that is delivered with a short applicator tube. The tube is inserted into the opening at the tip of the penis, which is the opening of the urethra. A tiny pellet of medicine is put in the urethra. The pellet dissolves and enhances erectile function. This is also called MUSE (medicated urethral system for erections) therapy. Vacuum pump. This is a pump with a ring on it. The pump and ring are placed on the penis and used to create pressure that helps the penis become erect. Penile implant surgery. In this procedure, you may receive: An inflatable implant. This consists of cylinders, a pump, and a reservoir. The cylinders can be inflated with a fluid that helps to create an erection, and they can be deflated after intercourse. A semi-rigid implant. This consists of two silicone rubber rods.  The rods provide some rigidity. They are also flexible, so the penis can both curve downward in its normal  position and become straight for sexual intercourse. Blood vessel surgery to improve blood flow to the penis. During this procedure, a blood vessel from a different part of the body is placed into the penis to allow blood to flow around (bypass) damaged or blocked blood vessels. Lifestyle changes, such as exercising more, losing weight, and quitting smoking. Follow these instructions at home: Medicines  Take over-the-counter and prescription medicines only as told by your health care provider. Do not increase the dosage without first discussing it with your health care provider. If you are using self-injections, do injections as directed by your health care provider. Make sure you avoid any veins that are on the surface of the penis. After giving an injection, apply pressure to the injection site for 5 minutes. Talk to your health care provider about how to prevent headaches while taking ED medicines. These medicines may cause a sudden headache due to the increase in blood flow in your body. General instructions Exercise regularly, as directed by your health care provider. Work with your health care provider to lose weight, if needed. Do not use any products that contain nicotine or tobacco. These products include cigarettes, chewing tobacco, and vaping devices, such as e-cigarettes. If you need help quitting, ask your health care provider. Before using a vacuum pump, read the instructions that come with the pump and discuss any questions with your health care provider. Keep all follow-up visits. This is important. Contact a health care provider if: You feel nauseous. You are vomiting. You get sudden headaches while taking ED medicines. You have any concerns about your sexual health. Get help right away if: You are taking oral or injectable medicines and you have an erection that lasts longer than 4 hours. If your health care provider is unavailable, go to the nearest emergency room for  evaluation. An erection that lasts much longer than 4 hours can result in permanent damage to your penis. You have severe pain in your groin or abdomen. You develop redness or severe swelling of your penis. You have redness spreading at your groin or lower abdomen. You are unable to urinate. You experience chest pain or a rapid heartbeat (palpitations) after taking oral medicines. These symptoms may represent a serious problem that is an emergency. Do not wait to see if the symptoms will go away. Get medical help right away. Call your local emergency services (911 in the [place] Do not drive yourself to the hospital. Summary Erectile dysfunction (ED) is the inability to get or keep an erection during sexual intercourse. This condition is diagnosed based on a physical exam, your symptoms, and tests to determine the cause. Treatment varies depending on the cause and may include medicines, hormone therapy, surgery, or a vacuum pump. You may need follow-up visits to make sure that you are using your medicines or devices correctly. Get help right away if you are taking or injecting medicines and you have an erection that lasts longer than 4 hours. This information is not intended to replace advice given to you by your health care provider. Make sure you discuss any questions you have with your health care provider. Document Revised: 01/27/2021 Document Reviewed: 01/27/2021 Elsevier Patient Education  [place] of a Plant-Based Diet for Urology Health  A plant-based diet emphasizes the consumption of whole, unprocessed plant foods while minimizing or excluding animal products  including meat and dairy products. This dietary approach has gained attention for its potential to promote overall health, including urology-related conditions. Incorporating a plant-based diet into your lifestyle can offer numerous benefits for maintaining optimal urology health.  1. Reduced Risk of Kidney  Stones: A plant-based diet is typically rich in fruits, vegetables, legumes, and whole grains. These foods are high in dietary fiber, potassium, and magnesium, which can help reduce the risk of developing kidney stones. Be careful to avoid high quantities of spinach, as these can contribute to kidney stone formation if eaten in large volumes. The increased intake of water-soluble fiber can enhance the excretion of waste products and prevent the crystallization of minerals that lead to stone formation.  2. Improved Prostate Health: Studies have suggested a link between the consumption of red and processed meats and an increased risk of prostate problems, including benign prostatic hyperplasia (BPH) and prostate cancer. By adopting a plant-based diet, you can lower your intake of saturated fats and decrease the risk of these conditions. PSA levels can often decrease on plant based diets! Plant foods are also rich in antioxidants and phytochemicals that have been associated with prostate health.  3. Better Bladder Function: A diet focused on plant-based foods can contribute to better bladder health by reducing the risk of urinary tract infections (UTIs). Berries, citrus fruits, and leafy greens are known for their high vitamin C content, which can acidify urine and create an environment less favorable for bacteria growth. Additionally, plant-based diets are generally lower in sodium, which can help prevent fluid retention and reduce the strain on the bladder.  4. Management of Erectile Dysfunction (ED): Some research suggests that a plant-based diet can positively impact erectile function. Plant-based diets are associated with improved cardiovascular health, which is crucial for maintaining healthy blood flow and nerve function required for proper erectile function. By reducing the consumption of high-cholesterol and high-saturated fat animal products, a plant-based diet may contribute to a decreased risk of  ED.  5. Prevention of Chronic Conditions: A plant-based diet can help prevent or manage chronic conditions such as obesity, diabetes, and hypertension. These conditions can contribute to urology-related issues, including urinary incontinence and kidney dysfunction. By maintaining a healthy weight and managing these conditions, you can reduce the risk of urology-related complications.  Conclusion: Embracing a plant-based diet can offer significant benefits for urology health. By incorporating a variety of colorful fruits, vegetables, whole grains, nuts, seeds, and legumes into your meals, you can support kidney health, prostate health, bladder function, and overall well-being. Remember to consult with a healthcare professional or registered dietitian before making any significant dietary changes, especially if you have existing health conditions. Your personalized approach to a plant-based diet can contribute to improved urology health and enhance your quality of life.

## 2022-11-24 NOTE — Progress Notes (Signed)
11/24/22 11:33 AM   Belva Bertin May 26, 1956 [id number]  CC: Erectile dysfunction, PSA screening  HPI: 67 year old male with a number of comorbidities and very long history of ED who previously was followed by [facility]ara Council, [place] and then [facility].  He previously was on Trimix.  He has not been on this for many years.  He currently is using sildenafil 100 mg on demand with only mild to moderate results.  He has a number of comorbidities including smoking, poorly controlled diabetes(hemoglobin A1c 9.4), hypertension and hypertension medications, CAD.  Nitroglycerin is on his medication list, but he has not taken this.  Recent PSA May 2023 normal at 0.26.  No recent testosterone values to review.   PMH: Past Medical History:  Diagnosis Date   CAD (coronary artery disease)    Diabetes mellitus without complication ([place])    Hx of dysplastic nevus 11/28/2007   R lat abdomen ant side   Hx of dysplastic nevus 04/05/2016   R of midline sacral, mild   Hx of dysplastic nevus 07/02/2019   R sacral post waistline, severe atypia   Hx of dysplastic nevus 05/08/2019   L sacral post waistline, moderate atypia   Hx of melanoma in situ 04/09/2020   Left cheek, txted with MOHs Dr. Manley Mason   Hyperlipidemia    Hypertension    Pemphigus     Surgical History: Past Surgical History:  Procedure Laterality Date   CORONARY STENT INTERVENTION N/A 08/26/2019   Procedure: CORONARY STENT INTERVENTION;  Surgeon: Isaias Cowman, MD;  Location: [facility];  Service: Cardiovascular;  Laterality: N/A;   fisture surgery  1979   LEFT HEART CATH AND CORONARY ANGIOGRAPHY Left 08/26/2019   Procedure: LEFT HEART CATH AND CORONARY ANGIOGRAPHY;  Surgeon: Isaias Cowman, MD;  Location: [facility];  Service: Cardiovascular;  Laterality: Left;   LOWER EXTREMITY ANGIOGRAPHY Left 08/27/2021   Procedure: LOWER EXTREMITY ANGIOGRAPHY;  Surgeon: Katha Cabal, MD;  Location: [facility];  Service: Cardiovascular;  Laterality: Left;   LOWER EXTREMITY ANGIOGRAPHY Left 07/06/2022   Procedure: Lower Extremity Angiography;  Surgeon: Katha Cabal, MD;  Location: [facility];  Service: Cardiovascular;  Laterality: Left;   LOWER EXTREMITY ANGIOGRAPHY Right 11/02/2022   Procedure: Lower Extremity Angiography;  Surgeon: Katha Cabal, MD;  Location: [facility];  Service: Cardiovascular;  Laterality: Right;   ROTATOR CUFF REPAIR     TRIGGER FINGER RELEASE     x several     Family History: Family History  Problem Relation Age of Onset   Benign prostatic hyperplasia Brother    Prostate cancer Neg Hx    Bladder Cancer Neg Hx    Kidney cancer Neg Hx     Social History:  reports that he has been smoking cigarettes. He has been smoking an average of .5 packs per day. He has never used smokeless tobacco. He reports current alcohol use of about 1.0 standard drink of alcohol per week. He reports that he does not use drugs.  Physical Exam: BP (!) 164/77 (BP Location: Left Arm, Patient Position: Sitting, Cuff Size: Normal)   Pulse 69   Ht 6' (1.829 m)   Wt 198 lb (89.8 kg)   BMI 26.85 kg/m    Constitutional:  Alert and oriented, No acute distress. Cardiovascular: No clubbing, cyanosis, or edema. Respiratory: Normal respiratory effort, no increased work of breathing. GI: Abdomen is soft, nontender, nondistended, no abdominal masses  Assessment & Plan:  67 year old male with a number of risk factors for ED including CAD, smoking, uncontrolled diabetes, hypertension, antihypertensive medications, and long history of ED.  He previously was on Trimix through [employer], [place], but has had some mild to moderate improvement on sildenafil 100 mg on demand.  He is interested in what other options he might have for ED.  We focused on behavioral strategies including smoking cessation, diabetes control, and diet.  I also recommended the Cialis 20 mg  every other day since he is getting some mild to moderate improvement with sildenafil at this time prior to resuming penile injections.  I also recommended a morning testosterone per the AUA guideline recommendations.  We reviewed his normal PSA values, AUA guidelines reviewed, can continue PSA screening every other year.  -Trial of Cialis 20 mg every other day for ED, risks and benefits discussed -Check testosterone level -RTC with [facility] in 4 to 6 weeks symptom check, consider testosterone replacement if low, consider resuming Trimix injections if persistent ED despite max dose Cialis   Nickolas Madrid, MD 11/24/2022  [facility] [address] [place], [place] [address] [phone]

## 2022-11-25 ENCOUNTER — Ambulatory Visit (INDEPENDENT_AMBULATORY_CARE_PROVIDER_SITE_OTHER): Payer: 59

## 2022-11-25 ENCOUNTER — Encounter (INDEPENDENT_AMBULATORY_CARE_PROVIDER_SITE_OTHER): Payer: Self-pay | Admitting: Nurse Practitioner

## 2022-11-25 ENCOUNTER — Ambulatory Visit (INDEPENDENT_AMBULATORY_CARE_PROVIDER_SITE_OTHER): Payer: 59 | Admitting: Nurse Practitioner

## 2022-11-25 VITALS — BP 132/71 | HR 63 | Resp 16 | Wt 205.0 lb

## 2022-11-25 DIAGNOSIS — I10 Essential (primary) hypertension: Secondary | ICD-10-CM

## 2022-11-25 DIAGNOSIS — I739 Peripheral vascular disease, unspecified: Secondary | ICD-10-CM | POA: Diagnosis not present

## 2022-11-25 DIAGNOSIS — Z9889 Other specified postprocedural states: Secondary | ICD-10-CM

## 2022-11-25 DIAGNOSIS — E119 Type 2 diabetes mellitus without complications: Secondary | ICD-10-CM

## 2022-11-28 LAB — VAS US ABI WITH/WO TBI
Left ABI: 1.01
Right ABI: 0.89

## 2022-12-11 ENCOUNTER — Encounter (INDEPENDENT_AMBULATORY_CARE_PROVIDER_SITE_OTHER): Payer: Self-pay | Admitting: Nurse Practitioner

## 2022-12-11 NOTE — Progress Notes (Signed)
Subjective:    Patient ID: Hector Garcia, male    DOB: 1956/04/16, 68 y.o.   MRN: [id number] Chief Complaint  Patient presents with   Follow-up    [facility] 3 week follow up    The patient returns to the office for followup and review status post angiogram with intervention on 11/02/2022.   Procedure: Procedure(s) Performed:             1.  Introduction catheter into right lower extremity 3rd order catheter placement              2.    Contrast injection right lower extremity for distal runoff             3.  Percutaneous transluminal angioplasty and stent placement right superficial femoral artery             4.  Star close closure left common femoral arteriotomy   The patient notes improvement in the lower extremity symptoms. No interval shortening of the patient's claudication distance or rest pain symptoms.  He notes that he has been able to walk 2 miles without issue.  No new ulcers or wounds have occurred since the last visit.  There have been no significant changes to the patient's overall health care.  No documented history of amaurosis fugax or recent TIA symptoms. There are no recent neurological changes noted. No documented history of DVT, PE or superficial thrombophlebitis. The patient denies recent episodes of angina or shortness of breath.   ABI's Rt=0.86 and Lt=1.01  (previous ABI's Rt=0.80 and Lt=1.08) Duplex US of the right lower extremity shows biphasic/triphasic waveforms with triphasic tibial artery waveforms on the left    Review of Systems  Cardiovascular:  Positive for leg swelling.  All other systems reviewed and are negative.      Objective:   Physical Exam Vitals reviewed.  HENT:     Head: Normocephalic.  Cardiovascular:     Rate and Rhythm: Normal rate.     Pulses:          Dorsalis pedis pulses are detected w/ Doppler on the right side and detected w/ Doppler on the left side.       Posterior tibial pulses are detected w/ Doppler on the  right side and detected w/ Doppler on the left side.  Pulmonary:     Effort: Pulmonary effort is normal.  Skin:    General: Skin is warm and dry.  Neurological:     Mental Status: He is alert and oriented to person, place, and time.  Psychiatric:        Mood and Affect: Mood normal.        Behavior: Behavior normal.        Thought Content: Thought content normal.        Judgment: Judgment normal.     BP 132/71 (BP Location: Left Arm)   Pulse 63   Resp 16   Wt 205 lb (93 kg)   BMI 27.80 kg/m   Past Medical History:  Diagnosis Date   CAD (coronary artery disease)    Diabetes mellitus without complication (HCC)    Hx of dysplastic nevus 11/28/2007   R lat abdomen ant side   Hx of dysplastic nevus 04/05/2016   R of midline sacral, mild   Hx of dysplastic nevus 07/02/2019   R sacral post waistline, severe atypia   Hx of dysplastic nevus 05/08/2019   L sacral post waistline, moderate atypia   Hx of  melanoma in situ 04/09/2020   Left cheek, txted with MOHs Dr. Manley Mason   Hyperlipidemia    Hypertension    Pemphigus     Social History   Socioeconomic History   Marital status: Divorced    Spouse name: Not on file   Number of children: Not on file   Years of education: Not on file   Highest education level: Not on file  Occupational History   Not on file  Tobacco Use   Smoking status: Every Day    Packs/day: 0.50    Types: Cigarettes    Last attempt to quit: 11/2016    Years since quitting: 6.0   Smokeless tobacco: Never   Tobacco comments:    may smoke 1 pack a week  Vaping Use   Vaping Use: Never used  Substance and Sexual Activity   Alcohol use: Yes    Alcohol/week: 1.0 standard drink of alcohol    Types: 1 Cans of beer per week    Comment: 1 beer weekly   Drug use: No   Sexual activity: Not on file  Other Topics Concern   Not on file  Social History Narrative   Not on file   Social Determinants of Health   Financial Resource Strain: Not on file   Food Insecurity: Not on file  Transportation Needs: Not on file  Physical Activity: Not on file  Stress: Not on file  Social Connections: Not on file  Intimate Partner Violence: Not on file    Past Surgical History:  Procedure Laterality Date   CORONARY STENT INTERVENTION N/A 08/26/2019   Procedure: CORONARY STENT INTERVENTION;  Surgeon: Isaias Cowman, MD;  Location: [facility];  Service: Cardiovascular;  Laterality: N/A;   fisture surgery  1979   LEFT HEART CATH AND CORONARY ANGIOGRAPHY Left 08/26/2019   Procedure: LEFT HEART CATH AND CORONARY ANGIOGRAPHY;  Surgeon: Isaias Cowman, MD;  Location: [place] [facility];  Service: Cardiovascular;  Laterality: Left;   LOWER EXTREMITY ANGIOGRAPHY Left 08/27/2021   Procedure: LOWER EXTREMITY ANGIOGRAPHY;  Surgeon: Katha Cabal, MD;  Location: [place] [facility];  Service: Cardiovascular;  Laterality: Left;   LOWER EXTREMITY ANGIOGRAPHY Left 07/06/2022   Procedure: Lower Extremity Angiography;  Surgeon: Katha Cabal, MD;  Location: [facility] [facility];  Service: Cardiovascular;  Laterality: Left;   LOWER EXTREMITY ANGIOGRAPHY Right 11/02/2022   Procedure: Lower Extremity Angiography;  Surgeon: Katha Cabal, MD;  Location: [facility];  Service: Cardiovascular;  Laterality: Right;   ROTATOR CUFF REPAIR     TRIGGER FINGER RELEASE     x several    Family History  Problem Relation Age of Onset   Benign prostatic hyperplasia Brother    Prostate cancer Neg Hx    Bladder Cancer Neg Hx    Kidney cancer Neg Hx     No Known Allergies     Latest Ref Rng & Units 08/27/2019    4:12 AM  CBC  WBC 4.0 - 10.5 K/uL 13.5   Hemoglobin 13.0 - 17.0 g/dL 13.0   Hematocrit 39.0 - 52.0 % 39.8   Platelets 150 - 400 K/uL 247       CMP     Component Value Date/Time   NA 137 08/27/2019 0412   K 4.1 08/27/2019 0412   CL 105 08/27/2019 0412   CO2 23 08/27/2019 0412   GLUCOSE 179 (H) 08/27/2019  0412   BUN 22 11/02/2022 1228   CREATININE 0.92 11/02/2022  1228   CALCIUM 8.9 08/27/2019 0412   GFRNONAA >60 11/02/2022 1228   GFRAA >60 08/27/2019 0412     VAS [place] ABI WITH/WO TBI  Result Date: 11/28/2022  LOWER EXTREMITY DOPPLER STUDY Patient Name:  Hector Garcia  Date of Exam:   11/25/2022 Medical Rec #: [id number]           Accession #:    [id number] Date of Birth: [birth year]            Patient Gender: M Patient Age:   86 years Exam Location:  [facility] Procedure:      VAS [place] ABI WITH/WO TBI Referring Phys: [facility] --------------------------------------------------------------------------------  Indications: Peripheral artery disease. High Risk Factors: Diabetes, current smoker.  Vascular Interventions: 11/02/2022 Right SFA stent                          09/07/21: Left SFA/popliteal stent with PTA angioplasty;                         08/2021 left atherectomy                         Left LEA thrombectomy stent. Performing Technologist: Delorise Shiner RVT  Examination Guidelines: A complete evaluation includes at minimum, Doppler waveform signals and systolic blood pressure reading at the level of bilateral brachial, anterior tibial, and posterior tibial arteries, when vessel segments are accessible. Bilateral testing is considered an integral part of a complete examination. Photoelectric Plethysmograph (PPG) waveforms and toe systolic pressure readings are included as required and additional duplex testing as needed. Limited examinations for reoccurring indications may be performed as noted.  ABI Findings: +---------+------------------+-----+---------+--------+ Right    Rt Pressure (mmHg)IndexWaveform Comment  +---------+------------------+-----+---------+--------+ Brachial 136                                      +---------+------------------+-----+---------+--------+ PTA      121               0.89 triphasic          +---------+------------------+-----+---------+--------+ DP       101               0.74 biphasic          +---------+------------------+-----+---------+--------+ Great Toe77                0.57                   +---------+------------------+-----+---------+--------+ +---------+------------------+-----+---------+-------+ Left     Lt Pressure (mmHg)IndexWaveform Comment +---------+------------------+-----+---------+-------+ Brachial 134                                     +---------+------------------+-----+---------+-------+ PTA      129               0.95 triphasic        +---------+------------------+-----+---------+-------+ DP       137               1.01 triphasic        +---------+------------------+-----+---------+-------+ Great Toe66                0.49                  +---------+------------------+-----+---------+-------+ +-------+-----------+-----------+------------+------------+  ABI/TBIToday's ABIToday's TBIPrevious ABIPrevious TBI +-------+-----------+-----------+------------+------------+ Right  0.89       0.57       0.80        0.38         +-------+-----------+-----------+------------+------------+ Left   1.01       0.49       1.08        0.53         +-------+-----------+-----------+------------+------------+ Right ABIs appear increased compared to prior study on 10/17/2022. Left ABIs appear essentially unchanged compared to prior study on 10/17/2022.  Summary: Right: Resting right ankle-brachial index indicates mild right lower extremity arterial disease. The right toe-brachial index is abnormal. Left: Resting left ankle-brachial index is within normal range. The left toe-brachial index is abnormal. *See table(s) above for measurements and observations.  Electronically signed by Hortencia Pilar MD on 11/28/2022 at 4:50:39 PM.    Final    VAS [place] ABI WITH/WO TBI  Result Date: 10/20/2022  LOWER EXTREMITY DOPPLER STUDY Patient Name:  Hector Garcia  Date of Exam:   10/17/2022 Medical Rec #: [id number]           Accession #:    [id number] Date of Birth: 05-24-1956            Patient Gender: M Patient Age:   28 years Exam Location:  [facility] Procedure:      VAS [place] ABI WITH/WO TBI Referring Phys: [facility] --------------------------------------------------------------------------------  Indications: Claudication, and peripheral artery disease. High Risk Factors: Diabetes, current smoker.  Vascular Interventions: 09/07/21: Left SFA/popliteal stent with PTA angioplasty;                         08/2021 left atherectomy                         Left LEA thrombectomy stent. Performing Technologist: Delorise Shiner RVT  Examination Guidelines: A complete evaluation includes at minimum, Doppler waveform signals and systolic blood pressure reading at the level of bilateral brachial, anterior tibial, and posterior tibial arteries, when vessel segments are accessible. Bilateral testing is considered an integral part of a complete examination. Photoelectric Plethysmograph (PPG) waveforms and toe systolic pressure readings are included as required and additional duplex testing as needed. Limited examinations for reoccurring indications may be performed as noted.  ABI Findings: +---------+------------------+-----+--------+--------+ Right    Rt Pressure (mmHg)IndexWaveformComment  +---------+------------------+-----+--------+--------+ Brachial 154                                     +---------+------------------+-----+--------+--------+ PTA      116               0.73 biphasic         +---------+------------------+-----+--------+--------+ DP       127               0.80 biphasic         +---------+------------------+-----+--------+--------+ Great Toe60                0.38                  +---------+------------------+-----+--------+--------+ +---------+------------------+-----+---------+-------+ Left     Lt Pressure  (mmHg)IndexWaveform Comment +---------+------------------+-----+---------+-------+ Brachial 159                                     +---------+------------------+-----+---------+-------+  PTA      159               1.00 triphasic        +---------+------------------+-----+---------+-------+ DP       172               1.08 triphasic        +---------+------------------+-----+---------+-------+ Great Toe85                0.53                  +---------+------------------+-----+---------+-------+ +-------+-----------+-----------+------------+------------+ ABI/TBIToday's ABIToday's TBIPrevious ABIPrevious TBI +-------+-----------+-----------+------------+------------+ Right  0.80       0.38       1.07        0.79         +-------+-----------+-----------+------------+------------+ Left   1.08       0.53       1.07        0.69         +-------+-----------+-----------+------------+------------+  Right ABIs appear decreased compared to prior study on 08/05/2022. Left ABIs appear essentially unchanged compared to prior study on 08/05/2022.  Summary: Right: Resting right ankle-brachial index indicates mild right lower extremity arterial disease. The right toe-brachial index is abnormal. Left: Resting left ankle-brachial index is within normal range. The left toe-brachial index is abnormal. *See table(s) above for measurements and observations.  Electronically signed by Hortencia Pilar MD on 10/20/2022 at 12:25:35 PM.    Final        Assessment & Plan:   1. Peripheral arterial disease with history of revascularization ([place]) Recommend:  The patient is status post successful angiogram with intervention.  The patient reports that the claudication symptoms and leg pain has improved.   The patient denies lifestyle limiting changes at this point in time.  No further invasive studies, angiography or surgery at this time The patient should continue walking and begin a more  formal exercise program.  The patient should continue antiplatelet therapy and aggressive treatment of the lipid abnormalities  Continued surveillance is indicated as atherosclerosis is likely to progress with time.    Patient should undergo noninvasive studies as ordered. The patient will follow up with me to review the studies.  - VAS [place] ABI WITH/WO TBI  2. Benign essential hypertension Continue antihypertensive medications as already ordered, these medications have been reviewed and there are no changes at this time.  3. Controlled type 2 diabetes mellitus without complication, without long-term current use of insulin ([place]) Continue hypoglycemic medications as already ordered, these medications have been reviewed and there are no changes at this time.  Hgb A1C to be monitored as already arranged by primary service   Current Outpatient Medications on File Prior to Visit  Medication Sig Dispense Refill   amLODipine (NORVASC) 10 MG tablet Take 10 mg by mouth daily.      aspirin EC 81 MG tablet Take 81 mg by mouth at bedtime.     clopidogrel (PLAVIX) 75 MG tablet Take 1 tablet (75 mg total) by mouth daily. 90 tablet 1   empagliflozin (JARDIANCE) 10 MG TABS tablet Take 1 tablet by mouth daily.     Flaxseed, Linseed, (BIO-FLAX) 1000 MG CAPS Take 1,000 mg by mouth daily.     gabapentin (NEURONTIN) 300 MG capsule 1 capsule by mouth 3 times daily     glipiZIDE (GLUCOTROL XL) 10 MG 24 hr tablet Take 1 tablet by mouth daily.     lisinopril-hydrochlorothiazide (ZESTORETIC) 20-25  MG tablet Take 1 tablet by mouth daily.     metFORMIN (GLUCOPHAGE) 500 MG tablet Take 500 mg by mouth 2 (two) times daily with a meal.     metoprolol succinate (TOPROL-XL) 25 MG 24 hr tablet Take 0.5 tablets (12.5 mg total) by mouth daily. (Patient taking differently: Take 25 mg by mouth daily.) 30 tablet 11   niacin (NIASPAN) 500 MG CR tablet Take 500 mg by mouth at bedtime.     Omega-3 Fatty Acids (FISH OIL) 1000 MG  CPDR Take 1,000 mg by mouth daily.     pantoprazole (PROTONIX) 40 MG tablet Take 1 tablet (40 mg total) by mouth daily. Additional refills per patient's primary service 30 tablet 5   Phenylephrine-Pheniramine (DRISTAN NA) Place 1 spray into the nose daily as needed (allergies).     pramipexole (MIRAPEX) 0.125 MG tablet Take 0.125 mg by mouth at bedtime.     rosuvastatin (CRESTOR) 20 MG tablet Take 20 mg by mouth at bedtime.     sildenafil (VIAGRA) 100 MG tablet Take 100 mg by mouth as needed for erectile dysfunction.     tadalafil (CIALIS) 20 MG tablet Take 1 tablet (20 mg total) by mouth every other day. 90 tablet 3   TRULICITY 1.82 XH/3.7JI SOPN Inject 0.75 mg into the skin.     vitamin C (ASCORBIC ACID) 500 MG tablet Take 500 mg by mouth daily.     zolpidem (AMBIEN) 10 MG tablet Take 5 mg by mouth at bedtime as needed for sleep.     nitroGLYCERIN (NITROSTAT) 0.4 MG SL tablet Place 0.4 mg under the tongue every 5 (five) minutes as needed for chest pain. (Patient not taking: Reported on 11/24/2022)     No current facility-administered medications on file prior to visit.    There are no Patient Instructions on file for this visit. No follow-ups on file.   Kris Hartmann, NP

## 2022-12-20 ENCOUNTER — Ambulatory Visit: Payer: Medicare HMO | Admitting: Dermatology

## 2022-12-20 VITALS — BP 158/76 | HR 66

## 2022-12-20 DIAGNOSIS — Z86018 Personal history of other benign neoplasm: Secondary | ICD-10-CM

## 2022-12-20 DIAGNOSIS — L578 Other skin changes due to chronic exposure to nonionizing radiation: Secondary | ICD-10-CM | POA: Diagnosis not present

## 2022-12-20 DIAGNOSIS — L570 Actinic keratosis: Secondary | ICD-10-CM | POA: Diagnosis not present

## 2022-12-20 DIAGNOSIS — D229 Melanocytic nevi, unspecified: Secondary | ICD-10-CM

## 2022-12-20 DIAGNOSIS — D692 Other nonthrombocytopenic purpura: Secondary | ICD-10-CM

## 2022-12-20 DIAGNOSIS — B353 Tinea pedis: Secondary | ICD-10-CM

## 2022-12-20 DIAGNOSIS — Z1283 Encounter for screening for malignant neoplasm of skin: Secondary | ICD-10-CM

## 2022-12-20 DIAGNOSIS — L821 Other seborrheic keratosis: Secondary | ICD-10-CM

## 2022-12-20 DIAGNOSIS — Z86006 Personal history of melanoma in-situ: Secondary | ICD-10-CM

## 2022-12-20 DIAGNOSIS — L814 Other melanin hyperpigmentation: Secondary | ICD-10-CM

## 2022-12-20 DIAGNOSIS — Z85820 Personal history of malignant melanoma of skin: Secondary | ICD-10-CM

## 2022-12-20 NOTE — Patient Instructions (Signed)
Due to recent changes in healthcare laws, you may see results of your pathology and/or laboratory studies on MyChart before the doctors have had a chance to review them. We understand that in some cases there may be results that are confusing or concerning to you. Please understand that not all results are received at the same time and often the doctors may need to interpret multiple results in order to provide you with the best plan of care or course of treatment. Therefore, we ask that you please give us 2 business days to thoroughly review all your results before contacting the office for clarification. Should we see a critical lab result, you will be contacted sooner.   If You Need Anything After Your Visit  If you have any questions or concerns for your doctor, please call our main line at [phone] and press option 4 to reach your doctor's medical assistant. If no one answers, please leave a voicemail as directed and we will return your call as soon as possible. Messages left after 4 pm will be answered the following business day.   You may also send us a message via MyChart. We typically respond to MyChart messages within 1-2 business days.  For prescription refills, please ask your pharmacy to contact our office. Our fax number is [phone].  If you have an urgent issue when the clinic is closed that cannot wait until the next business day, you can page your doctor at the number below.    Please note that while we do our best to be available for urgent issues outside of office hours, we are not available 24/7.   If you have an urgent issue and are unable to reach us, you may choose to seek medical care at your doctor's office, retail clinic, urgent care center, or emergency room.  If you have a medical emergency, please immediately call 911 or go to the emergency department.  Pager Numbers  - Dr. Kowalski: [phone]  - Dr. Moye: [phone]  - Dr. Stewart:  [phone]  In the event of inclement weather, please call our main line at [phone] for an update on the status of any delays or closures.  Dermatology Medication Tips: Please keep the boxes that topical medications come in in order to help keep track of the instructions about where and how to use these. Pharmacies typically print the medication instructions only on the boxes and not directly on the medication tubes.   If your medication is too expensive, please contact our office at [phone] option 4 or send us a message through MyChart.   We are unable to tell what your co-pay for medications will be in advance as this is different depending on your insurance coverage. However, we may be able to find a substitute medication at lower cost or fill out paperwork to get insurance to cover a needed medication.   If a prior authorization is required to get your medication covered by your insurance company, please allow us 1-2 business days to complete this process.  Drug prices often vary depending on where the prescription is filled and some pharmacies may offer cheaper prices.  The website www.goodrx.com contains coupons for medications through different pharmacies. The prices here do not account for what the cost may be with help from insurance (it may be cheaper with your insurance), but the website can give you the price if you did not use any insurance.  - You can print the associated coupon and take it with   your prescription to the pharmacy.  - You may also stop by our office during regular business hours and pick up a GoodRx coupon card.  - If you need your prescription sent electronically to a different pharmacy, notify our office through [place] MyChart or by phone at [phone] option 4.     Si Usted Necesita Algo Despus de Su Visita  Tambin puede enviarnos un mensaje a travs de MyChart. Por lo general respondemos a los mensajes de MyChart en el transcurso de 1 a 2  das hbiles.  Para renovar recetas, por favor pida a su farmacia que se ponga en contacto con nuestra oficina. Nuestro nmero de fax es el [phone].  Si tiene un asunto urgente cuando la clnica est cerrada y que no puede esperar hasta el siguiente da hbil, puede llamar/localizar a su doctor(a) al nmero que aparece a continuacin.   Por favor, tenga en cuenta que aunque hacemos todo lo posible para estar disponibles para asuntos urgentes fuera del horario de oficina, no estamos disponibles las 24 horas del da, los 7 das de la semana.   Si tiene un problema urgente y no puede comunicarse con nosotros, puede optar por buscar atencin mdica  en el consultorio de su doctor(a), en una clnica privada, en un centro de atencin urgente o en una sala de emergencias.  Si tiene una emergencia mdica, por favor llame inmediatamente al 911 o vaya a la sala de emergencias.  Nmeros de bper  - Dr. Kowalski: [phone]  - Dra. Moye: [phone]  - Dra. Stewart: [phone]  En caso de inclemencias del tiempo, por favor llame a nuestra lnea principal al [phone] para una actualizacin sobre el estado de cualquier retraso o cierre.  Consejos para la medicacin en dermatologa: Por favor, guarde las cajas en las que vienen los medicamentos de uso tpico para ayudarle a seguir las instrucciones sobre dnde y cmo usarlos. Las farmacias generalmente imprimen las instrucciones del medicamento slo en las cajas y no directamente en los tubos del medicamento.   Si su medicamento es muy caro, por favor, pngase en contacto con nuestra oficina llamando al [phone] y presione la opcin 4 o envenos un mensaje a travs de MyChart.   No podemos decirle cul ser su copago por los medicamentos por adelantado ya que esto es diferente dependiendo de la cobertura de su seguro. Sin embargo, es posible que podamos encontrar un medicamento sustituto a menor costo o llenar un formulario para que el  seguro cubra el medicamento que se considera necesario.   Si se requiere una autorizacin previa para que su compaa de seguros cubra su medicamento, por favor permtanos de 1 a 2 das hbiles para completar este proceso.  Los precios de los medicamentos varan con frecuencia dependiendo del lugar de dnde se surte la receta y alguna farmacias pueden ofrecer precios ms baratos.  El sitio web www.goodrx.com tiene cupones para medicamentos de diferentes farmacias. Los precios aqu no tienen en cuenta lo que podra costar con la ayuda del seguro (puede ser ms barato con su seguro), pero el sitio web puede darle el precio si no utiliz ningn seguro.  - Puede imprimir el cupn correspondiente y llevarlo con su receta a la farmacia.  - Tambin puede pasar por nuestra oficina durante el horario de atencin regular y recoger una tarjeta de cupones de GoodRx.  - Si necesita que su receta se enve electrnicamente a una farmacia diferente, informe a nuestra oficina a travs de MyChart de [place]   o por telfono llamando al [phone] y presione la opcin 4.  

## 2022-12-20 NOTE — Progress Notes (Unsigned)
Follow-Up Visit   Subjective  Hector Garcia is a 67 y.o. male who presents for the following: Annual Exam (Hx MM, hx dysplastic nevi ). The patient presents for Total-Body Skin Exam (TBSE) for skin cancer screening and mole check.  The patient has spots, moles and lesions to be evaluated, some may be new or changing and the patient has concerns that these could be cancer.  The following portions of the chart were reviewed this encounter and updated as appropriate:   Tobacco  Allergies  Meds  Problems  Med Hx  Surg Hx  Fam Hx     Review of Systems:  No other skin or systemic complaints except as noted in HPI or Assessment and Plan.  Objective  Well appearing patient in no apparent distress; mood and affect are within normal limits.  A full examination was performed including scalp, head, eyes, ears, nose, lips, neck, chest, axillae, abdomen, back, buttocks, bilateral upper extremities, bilateral lower extremities, hands, feet, fingers, toes, fingernails, and toenails. All findings within normal limits unless otherwise noted below.  Face x 5 (5) Erythematous thin papules/macules with gritty scale.   B/L foot Scaling and maceration web spaces and over distal and lateral soles.    Assessment & Plan  AK (actinic keratosis) (5) Face x 5 Destruction of lesion - Face x 5 Complexity: simple   Destruction method: cryotherapy   Informed consent: discussed and consent obtained   Timeout:  patient name, date of birth, surgical site, and procedure verified Lesion destroyed using liquid nitrogen: Yes   Region frozen until ice ball extended beyond lesion: Yes   Outcome: patient tolerated procedure well with no complications   Post-procedure details: wound care instructions given    Tinea pedis of both feet B/L foot S/P Lamisil, improved, no further tx needed. Recheck next visit.  Lentigines - Scattered tan macules - Due to sun exposure - Benign-appearing, observe - Recommend  daily broad spectrum sunscreen SPF 30+ to sun-exposed areas, reapply every 2 hours as needed. - Call for any changes  Seborrheic Keratoses - Stuck-on, waxy, tan-brown papules and/or plaques  - Benign-appearing - Discussed benign etiology and prognosis. - Observe - Call for any changes  Melanocytic Nevi - Tan-brown and/or pink-flesh-colored symmetric macules and papules - Benign appearing on exam today - Observation - Call clinic for new or changing moles - Recommend daily use of broad spectrum spf 30+ sunscreen to sun-exposed areas.   Hemangiomas - Red papules - Discussed benign nature - Observe - Call for any changes  Actinic Damage - Chronic condition, secondary to cumulative UV/sun exposure - diffuse scaly erythematous macules with underlying dyspigmentation - Recommend daily broad spectrum sunscreen SPF 30+ to sun-exposed areas, reapply every 2 hours as needed.  - Staying in the shade or wearing long sleeves, sun glasses (UVA+UVB protection) and wide brim hats (4-inch brim around the entire circumference of the hat) are also recommended for sun protection.  - Call for new or changing lesions.  Purpura - Chronic; persistent and recurrent.  Treatable, but not curable. - Violaceous macules and patches - Benign - Related to trauma, age, sun damage and/or use of blood thinners, chronic use of topical and/or oral steroids - Observe - Can use OTC arnica containing moisturizer such as Dermend Bruise Formula if desired - Call for worsening or other concerns  History of Melanoma in Situ - No evidence of recurrence today - Recommend regular full body skin exams - Recommend daily broad spectrum sunscreen SPF 30+  to sun-exposed areas, reapply every 2 hours as needed.  - Call if any new or changing lesions are noted between office visits - No LAD  History of Dysplastic Nevi - No evidence of recurrence today - Recommend regular full body skin exams - Recommend daily broad  spectrum sunscreen SPF 30+ to sun-exposed areas, reapply every 2 hours as needed.  - Call if any new or changing lesions are noted between office visits  Skin cancer screening performed today.  Return in about 6 months (around 06/20/2023) for TBSE.  Luther Redo, CMA, am acting as scribe for Sarina Ser, MD . Documentation: I have reviewed the above documentation for accuracy and completeness, and I agree with the above.  Sarina Ser, MD

## 2022-12-21 ENCOUNTER — Encounter: Payer: Self-pay | Admitting: Dermatology

## 2022-12-27 ENCOUNTER — Other Ambulatory Visit: Payer: Medicare HMO

## 2022-12-27 DIAGNOSIS — N529 Male erectile dysfunction, unspecified: Secondary | ICD-10-CM

## 2022-12-28 ENCOUNTER — Other Ambulatory Visit (INDEPENDENT_AMBULATORY_CARE_PROVIDER_SITE_OTHER): Payer: Self-pay | Admitting: Vascular Surgery

## 2022-12-28 LAB — TESTOSTERONE: Testosterone: 539 ng/dL (ref 264–916)

## 2022-12-29 NOTE — Progress Notes (Signed)
12/30/2022 9:16 AM   Hector Garcia [birth year] [id number]  Referring provider: Rusty Aus, MD [facility] [place],  [place] [address]  Urological history: 1.  Erectile dysfunction -Contributing factors of age, BPH, smoking, alcohol consumption, diabetes, CAD, HTN, lumbar disc disease, anticoagulation therapy and HLD -testosterone level (12/2022) 539 -SHIM 11 -failing PDE5i's   2. BPH w/ LU TS -PSA (03/2022) 0.26  Chief Complaint  Patient presents with   Erectile Dysfunction    HPI: Hector Garcia is a 67 y.o. male who presents today for ED.  He has been somewhat pleased with the tadalafil 20 mg daily.  He is wondering if there was a higher dose.  He is having issues with the firmness of the erections and the duration of the erections.  Patient still having spontaneous erections.  He denies any pain or curvature with erections.     SHIM     Row Name 12/30/22 0855         SHIM: Over the last 6 months:   How do you rate your confidence that you could get and keep an erection? Moderate     When you had erections with sexual stimulation, how often were your erections hard enough for penetration (entering your partner)? A Few Times (much less than half the time)     During sexual intercourse, how often were you able to maintain your erection after you had penetrated (entered) your partner? A Few Times (much less than half the time)     During sexual intercourse, how difficult was it to maintain your erection to completion of intercourse? Very Difficult     When you attempted sexual intercourse, how often was it satisfactory for you? A Few Times (much less than half the time)       SHIM Total Score   SHIM 11              Score: 1-7 Severe ED 8-11 Moderate ED 12-16 Mild-Moderate ED 17-21 Mild ED 22-25 No ED    PMH: Past Medical History:  Diagnosis Date   CAD (coronary artery disease)    Diabetes mellitus  without complication (HCC)    Hx of dysplastic nevus 11/28/2007   R lat abdomen ant side   Hx of dysplastic nevus 04/05/2016   R of midline sacral, mild   Hx of dysplastic nevus 07/02/2019   R sacral post waistline, severe atypia   Hx of dysplastic nevus 05/08/2019   L sacral post waistline, moderate atypia   Hx of melanoma in situ 04/09/2020   Left cheek, txted with MOHs Dr. Manley Mason   Hyperlipidemia    Hypertension    Pemphigus     Surgical History: Past Surgical History:  Procedure Laterality Date   CORONARY STENT INTERVENTION N/A 08/26/2019   Procedure: CORONARY STENT INTERVENTION;  Surgeon: Isaias Cowman, MD;  Location: [place];  Service: Cardiovascular;  Laterality: N/A;   fisture surgery  1979   LEFT HEART CATH AND CORONARY ANGIOGRAPHY Left 08/26/2019   Procedure: LEFT HEART CATH AND CORONARY ANGIOGRAPHY;  Surgeon: Isaias Cowman, MD;  Location: [place] [facility];  Service: Cardiovascular;  Laterality: Left;   LOWER EXTREMITY ANGIOGRAPHY Left 08/27/2021   Procedure: LOWER EXTREMITY ANGIOGRAPHY;  Surgeon: Katha Cabal, MD;  Location: [place] [facility];  Service: Cardiovascular;  Laterality: Left;   LOWER EXTREMITY ANGIOGRAPHY Left 07/06/2022   Procedure: Lower Extremity Angiography;  Surgeon: Katha Cabal, MD;  Location: [place] [facility] [facility];  Service: Cardiovascular;  Laterality: Left;   LOWER EXTREMITY ANGIOGRAPHY Right 11/02/2022   Procedure: Lower Extremity Angiography;  Surgeon: Katha Cabal, MD;  Location: [place];  Service: Cardiovascular;  Laterality: Right;   ROTATOR CUFF REPAIR     TRIGGER FINGER RELEASE     x several    Home Medications:  Allergies as of 12/30/2022   No Known Allergies      Medication List        Accurate as of December 30, 2022  9:16 AM. If you have any questions, ask your nurse or doctor.          STOP taking these medications    nitroGLYCERIN 0.4 MG SL tablet Commonly  known as: NITROSTAT Stopped by: Vertie Dibbern, PA-C       TAKE these medications    amLODipine 10 MG tablet Commonly known as: NORVASC Take 10 mg by mouth daily.   ascorbic acid 500 MG tablet Commonly known as: VITAMIN C Take 500 mg by mouth daily.   aspirin EC 81 MG tablet Take 81 mg by mouth at bedtime.   Bio-Flax 1000 MG Caps Take 1,000 mg by mouth daily.   clopidogrel 75 MG tablet Commonly known as: PLAVIX TAKE 1 TABLET BY MOUTH EVERY DAY   DRISTAN NA Place 1 spray into the nose daily as needed (allergies).   empagliflozin 10 MG Tabs tablet Commonly known as: JARDIANCE Take 1 tablet by mouth daily.   Fish Oil 1000 MG Cpdr Take 1,000 mg by mouth daily.   gabapentin 300 MG capsule Commonly known as: NEURONTIN 1 capsule by mouth 3 times daily   glipiZIDE 10 MG 24 hr tablet Commonly known as: GLUCOTROL XL Take 1 tablet by mouth daily.   lisinopril-hydrochlorothiazide 20-25 MG tablet Commonly known as: ZESTORETIC Take 1 tablet by mouth daily.   metFORMIN 500 MG tablet Commonly known as: GLUCOPHAGE Take 500 mg by mouth 2 (two) times daily with a meal.   metoprolol succinate 25 MG 24 hr tablet Commonly known as: TOPROL-XL Take 0.5 tablets (12.5 mg total) by mouth daily. What changed: how much to take   niacin 500 MG ER tablet Commonly known as: VITAMIN B3 Take 500 mg by mouth at bedtime.   pantoprazole 40 MG tablet Commonly known as: Protonix Take 1 tablet (40 mg total) by mouth daily. Additional refills per patient's primary service   pramipexole 0.125 MG tablet Commonly known as: MIRAPEX Take 0.125 mg by mouth at bedtime.   rosuvastatin 20 MG tablet Commonly known as: CRESTOR Take 20 mg by mouth at bedtime.   sildenafil 100 MG tablet Commonly known as: VIAGRA Take 1 tablet (100 mg total) by mouth as needed for erectile dysfunction.   tadalafil 20 MG tablet Commonly known as: CIALIS Take 1 tablet (20 mg total) by mouth every other day.    Trulicity [id number] Sopn Generic drug: Dulaglutide Inject 0.75 mg into the skin.   zolpidem 10 MG tablet Commonly known as: AMBIEN Take 5 mg by mouth at bedtime as needed for sleep.        Allergies: No Known Allergies  Family History: Family History  Problem Relation Age of Onset   Benign prostatic hyperplasia Brother    Prostate cancer Neg Hx    Bladder Cancer Neg Hx    Kidney cancer Neg Hx     Social History:  reports that he has been smoking cigarettes. He has been smoking an average  of .5 packs per day. He has never used smokeless tobacco. He reports current alcohol use of about 1.0 standard drink of alcohol per week. He reports that he does not use drugs.  ROS: Pertinent ROS in HPI  Physical Exam: BP (!) 148/80   Pulse 67   Ht 6' (1.829 m)   Wt 200 lb (90.7 kg)   BMI 27.12 kg/m   Constitutional:  Well nourished. Alert and oriented, No acute distress. HEENT: [place] AT, moist mucus membranes.  Trachea midline Cardiovascular: No clubbing, cyanosis, or edema. Respiratory: Normal respiratory effort, no increased work of breathing. Neurologic: Grossly intact, no focal deficits, moving all 4 extremities. Psychiatric: Normal mood and affect.  Laboratory Data: Hemoglobin A1c (12/2022) 7.1 Serum creatinine (12/2022) 1.0  Lab Results  Component Value Date   TESTOSTERONE 539 12/27/2022  I have reviewed the labs.   Pertinent Imaging: N/A  Assessment & Plan:    1. ED -Not at goal with tadalafil 20 mg daily -Will have a trial sildenafil 100 mg daily -He would like to have a trial of ICI only if he fails PDE 5 inhibitors completely  Return in about 3 months (around 03/30/2023) for SHIM .  These notes generated with voice recognition software. I apologize for typographical errors.  [place], [place] [address]  [place], [place] [address] [phone]

## 2022-12-30 ENCOUNTER — Ambulatory Visit: Payer: Medicare HMO | Admitting: Urology

## 2022-12-30 ENCOUNTER — Encounter: Payer: Self-pay | Admitting: Urology

## 2022-12-30 VITALS — BP 148/80 | HR 67 | Ht 72.0 in | Wt 200.0 lb

## 2022-12-30 DIAGNOSIS — N529 Male erectile dysfunction, unspecified: Secondary | ICD-10-CM

## 2022-12-30 MED ORDER — SILDENAFIL CITRATE 100 MG PO TABS: 100.0000 mg | ORAL_TABLET | ORAL | 0 refills | Status: DC | PRN

## 2023-02-14 ENCOUNTER — Other Ambulatory Visit (INDEPENDENT_AMBULATORY_CARE_PROVIDER_SITE_OTHER): Payer: Self-pay | Admitting: Nurse Practitioner

## 2023-02-14 DIAGNOSIS — I739 Peripheral vascular disease, unspecified: Secondary | ICD-10-CM

## 2023-02-26 DIAGNOSIS — I2510 Atherosclerotic heart disease of native coronary artery without angina pectoris: Secondary | ICD-10-CM | POA: Insufficient documentation

## 2023-02-26 NOTE — Progress Notes (Unsigned)
MRN : [id number]  GEOVANIE Garcia is a 67 y.o. (1956/05/05) male who presents with chief complaint of check circulation.  History of Present Illness:   The patient returns to the office for followup and review status post angiogram with intervention on 11/02/2022.    Procedure: Procedure(s) Performed:             1.  Introduction catheter into right lower extremity 3rd order catheter placement              2.    Contrast injection right lower extremity for distal runoff             3.  Percutaneous transluminal angioplasty and stent placement right superficial femoral artery             4.  Star close closure left common femoral arteriotomy     The patient notes improvement in the lower extremity symptoms. No interval shortening of the patient's claudication distance or rest pain symptoms.  He notes that he has been playing golf without issue.  No new ulcers or wounds have occurred since the last visit.   There have been no significant changes to the patient's overall health care.   No documented history of amaurosis fugax or recent TIA symptoms. There are no recent neurological changes noted. No documented history of DVT, PE or superficial thrombophlebitis. The patient denies recent episodes of angina or shortness of breath.    ABI's Rt=1.10 and Lt=1.14  (previous ABI's Rt=0.86 and Lt=1.01) Duplex US of the right lower extremity shows biphasic/triphasic waveforms with triphasic tibial artery waveforms on the left  No outpatient medications have been marked as taking for the 02/27/23 encounter (Appointment) with Gilda Crease, Latina Craver, MD.    Past Medical History:  Diagnosis Date   CAD (coronary artery disease)    Diabetes mellitus without complication (HCC)    Hx of dysplastic nevus 11/28/2007   R lat abdomen ant side   Hx of dysplastic nevus 04/05/2016   R of midline sacral, mild   Hx of dysplastic nevus 07/02/2019   R sacral post waistline, severe atypia    Hx of dysplastic nevus 05/08/2019   L sacral post waistline, moderate atypia   Hx of melanoma in situ 04/09/2020   Left cheek, txted with MOHs Dr. Lorn Junes   Hyperlipidemia    Hypertension    Pemphigus     Past Surgical History:  Procedure Laterality Date   CORONARY STENT INTERVENTION N/A 08/26/2019   Procedure: CORONARY STENT INTERVENTION;  Surgeon: Marcina Millard, MD;  Location: [facility];  Service: Cardiovascular;  Laterality: N/A;   fisture surgery  1979   LEFT HEART CATH AND CORONARY ANGIOGRAPHY Left 08/26/2019   Procedure: LEFT HEART CATH AND CORONARY ANGIOGRAPHY;  Surgeon: Marcina Millard, MD;  Location: [facility];  Service: Cardiovascular;  Laterality: Left;   LOWER EXTREMITY ANGIOGRAPHY Left 08/27/2021   Procedure: LOWER EXTREMITY ANGIOGRAPHY;  Surgeon: Renford Dills, MD;  Location: [facility];  Service: Cardiovascular;  Laterality: Left;   LOWER EXTREMITY ANGIOGRAPHY Left 07/06/2022   Procedure: Lower Extremity Angiography;  Surgeon: Renford Dills, MD;  Location: [facility];  Service: Cardiovascular;  Laterality: Left;   LOWER EXTREMITY ANGIOGRAPHY Right 11/02/2022   Procedure: Lower Extremity Angiography;  Surgeon: Renford Dills, MD;  Location: [facility];  Service: Cardiovascular;  Laterality: Right;  ROTATOR CUFF REPAIR     TRIGGER FINGER RELEASE     x several    Social History Social History   Tobacco Use   Smoking status: Every Day    Packs/day: .5    Types: Cigarettes    Last attempt to quit: 11/2016    Years since quitting: 6.2   Smokeless tobacco: Never   Tobacco comments:    may smoke 1 pack a week  Vaping Use   Vaping Use: Never used  Substance Use Topics   Alcohol use: Yes    Alcohol/week: 1.0 standard drink of alcohol    Types: 1 Cans of beer per week    Comment: 1 beer weekly   Drug use: No    Family History Family History  Problem Relation Age of Onset   Benign  prostatic hyperplasia Brother    Prostate cancer Neg Hx    Bladder Cancer Neg Hx    Kidney cancer Neg Hx     No Known Allergies   REVIEW OF SYSTEMS (Negative unless checked)  Constitutional: [] Weight loss  [] Fever  [] Chills Cardiac: [] Chest pain   [] Chest pressure   [] Palpitations   [] Shortness of breath when laying flat   [] Shortness of breath with exertion. Vascular:  [x] Pain in legs with walking   [] Pain in legs at rest  [] History of DVT   [] Phlebitis   [] Swelling in legs   [] Varicose veins   [] Non-healing ulcers Pulmonary:   [] Uses home oxygen   [] Productive cough   [] Hemoptysis   [] Wheeze  [] COPD   [] Asthma Neurologic:  [] Dizziness   [] Seizures   [] History of stroke   [] History of TIA  [] Aphasia   [] Vissual changes   [] Weakness or numbness in arm   [] Weakness or numbness in leg Musculoskeletal:   [] Joint swelling   [] Joint pain   [] Low back pain Hematologic:  [] Easy bruising  [] Easy bleeding   [] Hypercoagulable state   [] Anemic Gastrointestinal:  [] Diarrhea   [] Vomiting  [] Gastroesophageal reflux/heartburn   [] Difficulty swallowing. Genitourinary:  [] Chronic kidney disease   [] Difficult urination  [] Frequent urination   [] Blood in urine Skin:  [] Rashes   [] Ulcers  Psychological:  [] History of anxiety   []  History of major depression.  Physical Examination  There were no vitals filed for this visit. There is no height or weight on file to calculate BMI. Gen: WD/WN, NAD Head: [place]/AT, No temporalis wasting.  Ear/Nose/Throat: Hearing grossly intact, nares w/o erythema or drainage Eyes: PER, EOMI, sclera nonicteric.  Neck: Supple, no masses.  No bruit or JVD.  Pulmonary:  Good air movement, no audible wheezing, no use of accessory muscles.  Cardiac: RRR, normal S1, S2, no Murmurs. Vascular:  mild trophic changes, no open wounds Vessel Right Left  Radial Palpable Palpable  PT Palpable  Palpable  DP Not Palpable Not Palpable  Gastrointestinal: soft, non-distended. No guarding/no  peritoneal signs.  Musculoskeletal: M/S 5/5 throughout.  No visible deformity.  Neurologic: CN 2-12 intact. Pain and light touch intact in extremities.  Symmetrical.  Speech is fluent. Motor exam as listed above. Psychiatric: Judgment intact, Mood & affect appropriate for pt's clinical situation. Dermatologic: No rashes or ulcers noted.  No changes consistent with cellulitis.   CBC Lab Results  Component Value Date   WBC 13.5 (H) 08/27/2019   HGB 13.0 08/27/2019   HCT 39.8 08/27/2019   MCV 88.6 08/27/2019   PLT 247 08/27/2019    BMET    Component Value Date/Time   NA 137 08/27/2019  0412   K 4.1 08/27/2019 0412   CL 105 08/27/2019 0412   CO2 23 08/27/2019 0412   GLUCOSE 179 (H) 08/27/2019 0412   BUN 22 11/02/2022 1228   CREATININE 0.92 11/02/2022 1228   CALCIUM 8.9 08/27/2019 0412   GFRNONAA >60 11/02/2022 1228   GFRAA >60 08/27/2019 0412   CrCl cannot be calculated (Patient's most recent lab result is older than the maximum 21 days allowed.).  COAG No results found for: "INR", "PROTIME"  Radiology No results found.   Assessment/Plan 1. Atherosclerosis of native artery of right lower extremity with intermittent claudication Recommend:  The patient is status post successful angiogram with intervention.  The patient reports that the claudication symptoms and leg pain has improved.   The patient denies lifestyle limiting changes at this point in time.  No further invasive studies, angiography or surgery at this time The patient should continue walking and begin a more formal exercise program.  The patient should continue antiplatelet therapy and aggressive treatment of the lipid abnormalities  Continued surveillance is indicated as atherosclerosis is likely to progress with time.    Patient should undergo noninvasive studies as ordered. The patient will follow up with me to review the studies.  - VAS [place] LOWER EXTREMITY ARTERIAL DUPLEX; Future - VAS [place] ABI WITH/WO  TBI; Future  2. Benign essential hypertension Continue antihypertensive medications as already ordered, these medications have been reviewed and there are no changes at this time.  3. Hyperlipidemia, mixed Continue statin as ordered and reviewed, no changes at this time  4. Coronary artery disease involving native coronary artery of native heart with angina pectoris Continue cardiac and antihypertensive medications as already ordered and reviewed, no changes at this time.  Continue statin as ordered and reviewed, no changes at this time  Nitrates PRN for chest pain    Levora Dredge, MD  02/26/2023 12:52 PM

## 2023-02-27 ENCOUNTER — Encounter (INDEPENDENT_AMBULATORY_CARE_PROVIDER_SITE_OTHER): Payer: Self-pay | Admitting: Vascular Surgery

## 2023-02-27 ENCOUNTER — Ambulatory Visit (INDEPENDENT_AMBULATORY_CARE_PROVIDER_SITE_OTHER): Payer: Medicare HMO | Admitting: Vascular Surgery

## 2023-02-27 ENCOUNTER — Ambulatory Visit (INDEPENDENT_AMBULATORY_CARE_PROVIDER_SITE_OTHER): Payer: Medicare HMO

## 2023-02-27 VITALS — BP 161/77 | HR 54 | Resp 18 | Ht 72.0 in | Wt 209.2 lb

## 2023-02-27 DIAGNOSIS — I739 Peripheral vascular disease, unspecified: Secondary | ICD-10-CM | POA: Diagnosis not present

## 2023-02-27 DIAGNOSIS — I10 Essential (primary) hypertension: Secondary | ICD-10-CM

## 2023-02-27 DIAGNOSIS — E782 Mixed hyperlipidemia: Secondary | ICD-10-CM | POA: Diagnosis not present

## 2023-02-27 DIAGNOSIS — I70211 Atherosclerosis of native arteries of extremities with intermittent claudication, right leg: Secondary | ICD-10-CM

## 2023-02-27 DIAGNOSIS — Z9889 Other specified postprocedural states: Secondary | ICD-10-CM

## 2023-02-27 DIAGNOSIS — I25119 Atherosclerotic heart disease of native coronary artery with unspecified angina pectoris: Secondary | ICD-10-CM

## 2023-03-13 LAB — VAS US ABI WITH/WO TBI
Left ABI: 1.14
Right ABI: 1.1

## 2023-03-30 ENCOUNTER — Ambulatory Visit: Payer: Medicare HMO | Admitting: Urology

## 2023-04-04 ENCOUNTER — Other Ambulatory Visit: Payer: Self-pay | Admitting: *Deleted

## 2023-04-04 DIAGNOSIS — N529 Male erectile dysfunction, unspecified: Secondary | ICD-10-CM

## 2023-04-04 NOTE — Telephone Encounter (Signed)
Pt calling triage asking for refill for Sildenafil to [facility] pended.

## 2023-04-11 ENCOUNTER — Telehealth: Payer: Self-pay | Admitting: Urology

## 2023-04-11 DIAGNOSIS — N529 Male erectile dysfunction, unspecified: Secondary | ICD-10-CM

## 2023-04-11 MED ORDER — SILDENAFIL CITRATE 100 MG PO TABS: 100.0000 mg | ORAL_TABLET | ORAL | 1 refills | Status: DC | PRN

## 2023-04-11 NOTE — Telephone Encounter (Signed)
Pt needs refill for Sildenafil sent to mail order pharmacy, it looks like something has been done, but it says pending.  Pt said he contacted pharmacy and they sent a refill request also.

## 2023-04-11 NOTE — Telephone Encounter (Signed)
Spoke to patient and he does not use Tadalafil. He is doing well on Sildenafil 100mg . I have sent a refill to the mail order pharmacy.

## 2023-06-26 ENCOUNTER — Other Ambulatory Visit (INDEPENDENT_AMBULATORY_CARE_PROVIDER_SITE_OTHER): Payer: Self-pay | Admitting: Nurse Practitioner

## 2023-08-01 ENCOUNTER — Ambulatory Visit: Payer: 59 | Admitting: Dermatology

## 2023-08-20 NOTE — Progress Notes (Unsigned)
MRN : [id number]  Hector Garcia is a 67 y.o. (1956-03-23) male who presents with chief complaint of check circulation.  History of Present Illness:  History of Present Illness:    The patient returns to the office for followup and review status post angiogram with intervention on 11/02/2022.    Procedure: Procedure(s) Performed:             1.  Percutaneous transluminal angioplasty and stent placement right superficial femoral artery               The patient notes improvement in the lower extremity symptoms. No interval shortening of the patient's claudication distance or rest pain symptoms.  He notes that he has been playing golf without issue.  No new ulcers or wounds have occurred since the last visit.   There have been no significant changes to the patient's overall health care.   No documented history of amaurosis fugax or recent TIA symptoms. There are no recent neurological changes noted. No documented history of DVT, PE or superficial thrombophlebitis. The patient denies recent episodes of angina or shortness of breath.    ABI's Rt=1.10 and Lt=1.14  (previous ABI's Rt=0.86 and Lt=1.01) Duplex US of the right lower extremity shows biphasic/triphasic waveforms with triphasic tibial artery waveforms on the left    No outpatient medications have been marked as taking for the 08/21/23 encounter (Appointment) with Gilda Crease, Latina Craver, MD.    Past Medical History:  Diagnosis Date   CAD (coronary artery disease)    Diabetes mellitus without complication (HCC)    Hx of dysplastic nevus 11/28/2007   R lat abdomen ant side   Hx of dysplastic nevus 04/05/2016   R of midline sacral, mild   Hx of dysplastic nevus 07/02/2019   R sacral post waistline, severe atypia   Hx of dysplastic nevus 05/08/2019   L sacral post waistline, moderate atypia   Hx of melanoma in situ 04/09/2020   Left cheek, txted with MOHs  Dr. Lorn Junes   Hyperlipidemia    Hypertension    Pemphigus     Past Surgical History:  Procedure Laterality Date   CORONARY STENT INTERVENTION N/A 08/26/2019   Procedure: CORONARY STENT INTERVENTION;  Surgeon: Marcina Millard, MD;  Location: [facility];  Service: Cardiovascular;  Laterality: N/A;   fisture surgery  1979   LEFT HEART CATH AND CORONARY ANGIOGRAPHY Left 08/26/2019   Procedure: LEFT HEART CATH AND CORONARY ANGIOGRAPHY;  Surgeon: Marcina Millard, MD;  Location: [facility];  Service: Cardiovascular;  Laterality: Left;   LOWER EXTREMITY ANGIOGRAPHY Left 08/27/2021   Procedure: LOWER EXTREMITY ANGIOGRAPHY;  Surgeon: Renford Dills, MD;  Location: [facility];  Service: Cardiovascular;  Laterality: Left;   LOWER EXTREMITY ANGIOGRAPHY Left 07/06/2022   Procedure: Lower Extremity Angiography;  Surgeon: Renford Dills, MD;  Location: [facility];  Service: Cardiovascular;  Laterality: Left;   LOWER EXTREMITY ANGIOGRAPHY Right 11/02/2022   Procedure: Lower Extremity Angiography;  Surgeon: Renford Dills, MD;  Location: [facility];  Service: Cardiovascular;  Laterality: Right;  ROTATOR CUFF REPAIR     TRIGGER FINGER RELEASE     x several    Social History Social History   Tobacco Use   Smoking status: Every Day    Current packs/day: 0.00    Types: Cigarettes    Last attempt to quit: 11/2016    Years since quitting: 6.7   Smokeless tobacco: Never   Tobacco comments:    may smoke 1 pack a week  Vaping Use   Vaping status: Never Used  Substance Use Topics   Alcohol use: Yes    Alcohol/week: 1.0 standard drink of alcohol    Types: 1 Cans of beer per week    Comment: 1 beer weekly   Drug use: No    Family History Family History  Problem Relation Age of Onset   Benign prostatic hyperplasia Brother    Prostate cancer Neg Hx    Bladder Cancer Neg Hx    Kidney cancer Neg Hx     No Known  Allergies   REVIEW OF SYSTEMS (Negative unless checked)  Constitutional: [] Weight loss  [] Fever  [] Chills Cardiac: [] Chest pain   [] Chest pressure   [] Palpitations   [] Shortness of breath when laying flat   [] Shortness of breath with exertion. Vascular:  [x] Pain in legs with walking   [] Pain in legs at rest  [] History of DVT   [] Phlebitis   [] Swelling in legs   [] Varicose veins   [] Non-healing ulcers Pulmonary:   [] Uses home oxygen   [] Productive cough   [] Hemoptysis   [] Wheeze  [] COPD   [] Asthma Neurologic:  [] Dizziness   [] Seizures   [] History of stroke   [] History of TIA  [] Aphasia   [] Vissual changes   [] Weakness or numbness in arm   [] Weakness or numbness in leg Musculoskeletal:   [] Joint swelling   [] Joint pain   [] Low back pain Hematologic:  [] Easy bruising  [] Easy bleeding   [] Hypercoagulable state   [] Anemic Gastrointestinal:  [] Diarrhea   [] Vomiting  [] Gastroesophageal reflux/heartburn   [] Difficulty swallowing. Genitourinary:  [] Chronic kidney disease   [] Difficult urination  [] Frequent urination   [] Blood in urine Skin:  [] Rashes   [] Ulcers  Psychological:  [] History of anxiety   []  History of major depression.  Physical Examination  There were no vitals filed for this visit. There is no height or weight on file to calculate BMI. Gen: WD/WN, NAD Head: [place]/AT, No temporalis wasting.  Ear/Nose/Throat: Hearing grossly intact, nares w/o erythema or drainage Eyes: PER, EOMI, sclera nonicteric.  Neck: Supple, no masses.  No bruit or JVD.  Pulmonary:  Good air movement, no audible wheezing, no use of accessory muscles.  Cardiac: RRR, normal S1, S2, no Murmurs. Vascular:  mild trophic changes, no open wounds Vessel Right Left  Radial Palpable Palpable  PT Not Palpable Not Palpable  DP Not Palpable Not Palpable  Gastrointestinal: soft, non-distended. No guarding/no peritoneal signs.  Musculoskeletal: M/S 5/5 throughout.  No visible deformity.  Neurologic: CN 2-12 intact. Pain and  light touch intact in extremities.  Symmetrical.  Speech is fluent. Motor exam as listed above. Psychiatric: Judgment intact, Mood & affect appropriate for pt's clinical situation. Dermatologic: No rashes or ulcers noted.  No changes consistent with cellulitis.   CBC Lab Results  Component Value Date   WBC 13.5 (H) 08/27/2019   HGB 13.0 08/27/2019   HCT 39.8 08/27/2019   MCV 88.6 08/27/2019   PLT 247 08/27/2019    BMET    Component Value Date/Time   NA  137 08/27/2019 0412   K 4.1 08/27/2019 0412   CL 105 08/27/2019 0412   CO2 23 08/27/2019 0412   GLUCOSE 179 (H) 08/27/2019 0412   BUN 22 11/02/2022 1228   CREATININE 0.92 11/02/2022 1228   CALCIUM 8.9 08/27/2019 0412   GFRNONAA >60 11/02/2022 1228   GFRAA >60 08/27/2019 0412   CrCl cannot be calculated (Patient's most recent lab result is older than the maximum 21 days allowed.).  COAG No results found for: "INR", "PROTIME"  Radiology No results found.   Assessment/Plan There are no diagnoses linked to this encounter.   Levora Dredge, MD  08/20/2023 2:49 PM

## 2023-08-21 ENCOUNTER — Encounter (INDEPENDENT_AMBULATORY_CARE_PROVIDER_SITE_OTHER): Payer: Self-pay | Admitting: Vascular Surgery

## 2023-08-21 ENCOUNTER — Ambulatory Visit (INDEPENDENT_AMBULATORY_CARE_PROVIDER_SITE_OTHER): Payer: Medicare HMO

## 2023-08-21 ENCOUNTER — Ambulatory Visit (INDEPENDENT_AMBULATORY_CARE_PROVIDER_SITE_OTHER): Payer: Medicare HMO | Admitting: Vascular Surgery

## 2023-08-21 VITALS — BP 131/73 | HR 66 | Resp 16 | Wt 214.4 lb

## 2023-08-21 DIAGNOSIS — I25119 Atherosclerotic heart disease of native coronary artery with unspecified angina pectoris: Secondary | ICD-10-CM

## 2023-08-21 DIAGNOSIS — I70211 Atherosclerosis of native arteries of extremities with intermittent claudication, right leg: Secondary | ICD-10-CM

## 2023-08-21 DIAGNOSIS — I10 Essential (primary) hypertension: Secondary | ICD-10-CM

## 2023-08-21 DIAGNOSIS — E782 Mixed hyperlipidemia: Secondary | ICD-10-CM

## 2023-08-24 ENCOUNTER — Encounter (INDEPENDENT_AMBULATORY_CARE_PROVIDER_SITE_OTHER): Payer: Self-pay | Admitting: Vascular Surgery

## 2023-08-28 ENCOUNTER — Ambulatory Visit: Payer: 59 | Admitting: Dermatology

## 2023-08-31 LAB — VAS US ABI WITH/WO TBI
Left ABI: 1.12
Right ABI: 1.11

## 2023-09-19 ENCOUNTER — Encounter: Payer: Self-pay | Admitting: Dermatology

## 2023-09-19 ENCOUNTER — Ambulatory Visit: Payer: Medicare HMO | Admitting: Dermatology

## 2023-09-19 DIAGNOSIS — D1801 Hemangioma of skin and subcutaneous tissue: Secondary | ICD-10-CM

## 2023-09-19 DIAGNOSIS — Z1283 Encounter for screening for malignant neoplasm of skin: Secondary | ICD-10-CM | POA: Diagnosis not present

## 2023-09-19 DIAGNOSIS — L820 Inflamed seborrheic keratosis: Secondary | ICD-10-CM

## 2023-09-19 DIAGNOSIS — L570 Actinic keratosis: Secondary | ICD-10-CM

## 2023-09-19 DIAGNOSIS — L814 Other melanin hyperpigmentation: Secondary | ICD-10-CM | POA: Diagnosis not present

## 2023-09-19 DIAGNOSIS — L578 Other skin changes due to chronic exposure to nonionizing radiation: Secondary | ICD-10-CM

## 2023-09-19 DIAGNOSIS — Q828 Other specified congenital malformations of skin: Secondary | ICD-10-CM

## 2023-09-19 DIAGNOSIS — Z86006 Personal history of melanoma in-situ: Secondary | ICD-10-CM

## 2023-09-19 DIAGNOSIS — L821 Other seborrheic keratosis: Secondary | ICD-10-CM

## 2023-09-19 DIAGNOSIS — D229 Melanocytic nevi, unspecified: Secondary | ICD-10-CM

## 2023-09-19 DIAGNOSIS — W908XXA Exposure to other nonionizing radiation, initial encounter: Secondary | ICD-10-CM

## 2023-09-19 DIAGNOSIS — Z86018 Personal history of other benign neoplasm: Secondary | ICD-10-CM

## 2023-09-19 NOTE — Patient Instructions (Addendum)
Cryotherapy Aftercare  Wash gently with soap and water everyday.   Apply Vaseline Jelly daily until healed.   Return to clinic in 2 months if not resolved. Right calf.   Recommend daily broad spectrum sunscreen SPF 30+ to sun-exposed areas, reapply every 2 hours as needed. Call for new or changing lesions.  Staying in the shade or wearing long sleeves, sun glasses (UVA+UVB protection) and wide brim hats (4-inch brim around the entire circumference of the hat) are also recommended for sun protection.      Melanoma ABCDEs  Melanoma is the most dangerous type of skin cancer, and is the leading cause of death from skin disease.  You are more likely to develop melanoma if you: Have light-colored skin, light-colored eyes, or red or blond hair Spend a lot of time in the sun Tan regularly, either outdoors or in a tanning bed Have had blistering sunburns, especially during childhood Have a close family member who has had a melanoma Have atypical moles or large birthmarks  Early detection of melanoma is key since treatment is typically straightforward and cure rates are extremely high if we catch it early.   The first sign of melanoma is often a change in a mole or a new dark spot.  The ABCDE system is a way of remembering the signs of melanoma.  A for asymmetry:  The two halves do not match. B for border:  The edges of the growth are irregular. C for color:  A mixture of colors are present instead of an even brown color. D for diameter:  Melanomas are usually (but not always) greater than 6mm - the size of a pencil eraser. E for evolution:  The spot keeps changing in size, shape, and color.  Please check your skin once per month between visits. You can use a small mirror in front and a large mirror behind you to keep an eye on the back side or your body.   If you see any new or changing lesions before your next follow-up, please call to schedule a visit.  Please continue daily skin  protection including broad spectrum sunscreen SPF 30+ to sun-exposed areas, reapplying every 2 hours as needed when you're outdoors.   Staying in the shade or wearing long sleeves, sun glasses (UVA+UVB protection) and wide brim hats (4-inch brim around the entire circumference of the hat) are also recommended for sun protection.       Due to recent changes in healthcare laws, you may see results of your pathology and/or laboratory studies on MyChart before the doctors have had a chance to review them. We understand that in some cases there may be results that are confusing or concerning to you. Please understand that not all results are received at the same time and often the doctors may need to interpret multiple results in order to provide you with the best plan of care or course of treatment. Therefore, we ask that you please give [place] 2 business days to thoroughly review all your results before contacting the office for clarification. Should we see a critical lab result, you will be contacted sooner.   If You Need Anything After Your Visit  If you have any questions or concerns for your doctor, please call our main line at [phone] and press option 4 to reach your doctor's medical assistant. If no one answers, please leave a voicemail as directed and we will return your call as soon as possible. Messages left after 4 pm will  be answered the following business day.   You may also send [place] a message via MyChart. We typically respond to MyChart messages within 1-2 business days.  For prescription refills, please ask your pharmacy to contact our office. Our fax number is [phone].  If you have an urgent issue when the clinic is closed that cannot wait until the next business day, you can page your doctor at the number below.    Please note that while we do our best to be available for urgent issues outside of office hours, we are not available 24/7.   If you have an urgent issue and are  unable to reach [place], you may choose to seek medical care at your doctor's office, retail clinic, urgent care center, or emergency room.  If you have a medical emergency, please immediately call 911 or go to the emergency department.  Pager Numbers  - Dr. Gwen Pounds: [phone]  - Dr. Roseanne Reno: [phone]  - Dr. Katrinka Blazing: [phone]   In the event of inclement weather, please call our main line at [phone] for an update on the status of any delays or closures.  Dermatology Medication Tips: Please keep the boxes that topical medications come in in order to help keep track of the instructions about where and how to use these. Pharmacies typically print the medication instructions only on the boxes and not directly on the medication tubes.   If your medication is too expensive, please contact our office at [phone] option 4 or send [place] a message through MyChart.   We are unable to tell what your co-pay for medications will be in advance as this is different depending on your insurance coverage. However, we may be able to find a substitute medication at lower cost or fill out paperwork to get insurance to cover a needed medication.   If a prior authorization is required to get your medication covered by your insurance company, please allow [place] 1-2 business days to complete this process.  Drug prices often vary depending on where the prescription is filled and some pharmacies may offer cheaper prices.  The website www.goodrx.com contains coupons for medications through different pharmacies. The prices here do not account for what the cost may be with help from insurance (it may be cheaper with your insurance), but the website can give you the price if you did not use any insurance.  - You can print the associated coupon and take it with your prescription to the pharmacy.  - You may also stop by our office during regular business hours and pick up a GoodRx coupon card.  - If you need your  prescription sent electronically to a different pharmacy, notify our office through [facility] or by phone at [phone] option 4.     Si Usted Necesita Algo Despus de Su Visita  Tambin puede enviarnos un mensaje a travs de Clinical cytogeneticist. Por lo general respondemos a los mensajes de MyChart en el transcurso de 1 a 2 das hbiles.  Para renovar recetas, por favor pida a su farmacia que se ponga en contacto con nuestra oficina. Annie Sable de fax es [place] [phone].  Si tiene un asunto urgente cuando la clnica est cerrada y que no puede esperar hasta el siguiente da hbil, puede llamar/localizar a su doctor(a) al nmero que aparece a continuacin.   Por favor, tenga en cuenta que aunque hacemos todo lo posible para estar disponibles para asuntos urgentes fuera del horario de oficina, no estamos disponibles las 24 horas  del da, los 7 [address]  [address] de la [place].   Si tiene un problema urgente y no puede comunicarse con nosotros, puede optar por buscar atencin mdica  en el consultorio de su doctor(a), en una clnica privada, en un centro de atencin urgente o en una sala de emergencias.  Si tiene Engineer, drilling, por favor llame inmediatamente al 911 o vaya a la sala de emergencias.  Nmeros de bper  - Dr. Gwen Pounds: [phone]  - Dra. Roseanne Reno: [phone]  - Dr. Katrinka Blazing: [phone]   En caso de inclemencias del tiempo, por favor llame a Lacy Duverney principal al [phone] para una actualizacin sobre el [place] de cualquier retraso o cierre.  Consejos para la medicacin en dermatologa: Por favor, guarde las cajas en las que vienen los medicamentos de uso tpico para ayudarle a seguir las instrucciones sobre dnde y cmo usarlos. Las farmacias generalmente imprimen las instrucciones del medicamento slo en las cajas y no directamente en los tubos del [place].   Si su medicamento es muy caro, por favor, pngase en contacto con Rolm Gala llamando al  [phone] y presione la opcin 4 o envenos un mensaje a travs de Clinical cytogeneticist.   No podemos decirle cul ser su copago por los medicamentos por adelantado ya que esto es diferente dependiendo de la cobertura de su seguro. Sin embargo, es posible que podamos encontrar un medicamento sustituto a Audiological scientist un formulario para que el seguro cubra el medicamento que se considera necesario.   Si se requiere una autorizacin previa para que su compaa de seguros [place] su medicamento, por favor permtanos de 1 a 2 das hbiles para completar [address]  Los precios de los medicamentos varan con frecuencia dependiendo del Environmental consultant de dnde se surte la receta y alguna farmacias pueden ofrecer precios ms baratos.  El sitio web www.goodrx.com tiene cupones para medicamentos de Health and safety inspector. Los precios aqu no tienen en cuenta lo que podra costar con la ayuda del seguro (puede ser ms barato con su seguro), pero el sitio web puede darle el precio si no utiliz Tourist information centre manager.  - Puede imprimir el cupn correspondiente y llevarlo con su receta a la farmacia.  - Tambin puede pasar por nuestra oficina durante el horario de atencin regular y Education officer, museum una tarjeta de cupones de GoodRx.  - Si necesita que su receta se enve electrnicamente a una farmacia diferente, informe a nuestra oficina a travs de MyChart de [place] o por telfono llamando al [phone] y presione la opcin 4.

## 2023-09-19 NOTE — Progress Notes (Signed)
Follow-Up Visit   Subjective  Hector Garcia is a 67 y.o. male who presents for the following: Skin Cancer Screening and Full Body Skin Exam. Hx of multiple dysplastic nevi. Hx MIS  The patient presents for Total-Body Skin Exam (TBSE) for skin cancer screening and mole check. The patient has spots, moles and lesions to be evaluated, some may be new or changing and the patient may have concern these could be cancer.   The following portions of the chart were reviewed this encounter and updated as appropriate: medications, allergies, medical history  Review of Systems:  No other skin or systemic complaints except as noted in HPI or Assessment and Plan.  Objective  Well appearing patient in no apparent distress; mood and affect are within normal limits.  A full examination was performed including scalp, head, eyes, ears, nose, lips, neck, chest, axillae, abdomen, back, buttocks, bilateral upper extremities, bilateral lower extremities, hands, feet, fingers, toes, fingernails, and toenails. All findings within normal limits unless otherwise noted below.   Relevant physical exam findings are noted in the Assessment and Plan.  Scalp and forehead x8 (8) Erythematous thin papules/macules with gritty scale.   Right calf x1 Erythematous keratotic or waxy stuck-on papule or plaque.    Assessment & Plan    HISTORY OF MELANOMA IN SITU. Left cheek. Mohs 04/09/2020. - No evidence of recurrence today - No lymphadenopathy - Recommend regular full body skin exams - Recommend daily broad spectrum sunscreen SPF 30+ to sun-exposed areas, reapply every 2 hours as needed.  - Call if any new or changing lesions are noted between office visits   HISTORY OF DYSPLASTIC NEVI. Multiple, see history.  No evidence of recurrence today Recommend regular full body skin exams Recommend daily broad spectrum sunscreen SPF 30+ to sun-exposed areas, reapply every 2 hours as needed.  Call if any new or changing  lesions are noted between office visits   SKIN CANCER SCREENING PERFORMED TODAY.  ACTINIC DAMAGE - Chronic condition, secondary to cumulative UV/sun exposure - diffuse scaly erythematous macules with underlying dyspigmentation - Recommend daily broad spectrum sunscreen SPF 30+ to sun-exposed areas, reapply every 2 hours as needed.  - Staying in the shade or wearing long sleeves, sun glasses (UVA+UVB protection) and wide brim hats (4-inch brim around the entire circumference of the hat) are also recommended for sun protection.  - Call for new or changing lesions.  LENTIGINES, SEBORRHEIC KERATOSES, HEMANGIOMAS - Benign normal skin lesions - Benign-appearing - Call for any changes  MELANOCYTIC NEVI - Tan-brown and/or pink-flesh-colored symmetric macules and papules - Benign appearing on exam today - Observation - Call clinic for new or changing moles - Recommend daily use of broad spectrum spf 30+ sunscreen to sun-exposed areas.   HISTORY OF DYSPLASTIC NEVUS No evidence of recurrence today Recommend regular full body skin exams Recommend daily broad spectrum sunscreen SPF 30+ to sun-exposed areas, reapply every 2 hours as needed.  Call if any new or changing lesions are noted between office visits  HISTORY OF MELANOMA IN SITU - No evidence of recurrence today - Recommend regular full body skin exams - Recommend daily broad spectrum sunscreen SPF 30+ to sun-exposed areas, reapply every 2 hours as needed.  - Call if any new or changing lesions are noted between office visits  Porokeratosis  Exam: annular scaly pink plaque with erosions from trauma on left lower leg Treatment:  Benign-appearing.  Observation.  Call clinic for new or changing lesions.  Recommend daily use of  broad spectrum spf 30+ sunscreen to sun-exposed areas.   Lentigo vs other  Exam: medium brown macule at right chest.  Patient denies changes. States has been here at least 1 year.   Treatment: The patient  will observe these symptoms, and report promptly any changes.   AK (actinic keratosis) (8) Scalp and forehead x8  Actinic keratoses are precancerous spots that appear secondary to cumulative UV radiation exposure/sun exposure over time. They are chronic with expected duration over 1 year. A portion of actinic keratoses will progress to squamous cell carcinoma of the skin. It is not possible to reliably predict which spots will progress to skin cancer and so treatment is recommended to prevent development of skin cancer.  Recommend daily broad spectrum sunscreen SPF 30+ to sun-exposed areas, reapply every 2 hours as needed.  Recommend staying in the shade or wearing long sleeves, sun glasses (UVA+UVB protection) and wide brim hats (4-inch brim around the entire circumference of the hat). Call for new or changing lesions.  Destruction of lesion - Scalp and forehead x8 (8) Complexity: simple   Destruction method: cryotherapy   Informed consent: discussed and consent obtained   Timeout:  patient name, date of birth, surgical site, and procedure verified Lesion destroyed using liquid nitrogen: Yes   Region frozen until ice ball extended beyond lesion: Yes   Cryo cycles: 1 or 2. Outcome: patient tolerated procedure well with no complications   Post-procedure details: wound care instructions given   Additional details:  Prior to procedure, discussed risks of blister formation, small wound, skin dyspigmentation, or rare scar following cryotherapy. Recommend Vaseline ointment to treated areas while healing.   Inflamed seborrheic keratosis Right calf x1  Vs scc or bcc.   Symptomatic, irritating, patient would like treated. Recommended biopsy. Patient prefers empiric cryotherapy. Patient will return to clinic in 2 months if not resolved.   Destruction of lesion - Right calf x1 Complexity: simple   Destruction method: cryotherapy   Informed consent: discussed and consent obtained   Timeout:   patient name, date of birth, surgical site, and procedure verified Lesion destroyed using liquid nitrogen: Yes   Region frozen until ice ball extended beyond lesion: Yes   Cryo cycles: 1 or 2. Outcome: patient tolerated procedure well with no complications   Post-procedure details: wound care instructions given   Additional details:  Prior to procedure, discussed risks of blister formation, small wound, skin dyspigmentation, or rare scar following cryotherapy. Recommend Vaseline ointment to treated areas while healing.   Multiple benign nevi  Lentigines  Actinic elastosis  Seborrheic keratoses  Cherry angioma  Porokeratosis   Return in about 6 months (around 03/18/2024) for TBSE, HxMIS, HxDN.  I, Lawson Radar, CMA, am acting as scribe for Elie Goody, MD.   Documentation: I have reviewed the above documentation for accuracy and completeness, and I agree with the above.  Elie Goody, MD

## 2023-10-09 ENCOUNTER — Telehealth: Payer: Self-pay | Admitting: Urology

## 2023-10-09 ENCOUNTER — Other Ambulatory Visit: Payer: Self-pay | Admitting: Urology

## 2023-10-09 DIAGNOSIS — N529 Male erectile dysfunction, unspecified: Secondary | ICD-10-CM

## 2023-10-09 MED ORDER — SILDENAFIL CITRATE 100 MG PO TABS: 100.0000 mg | ORAL_TABLET | ORAL | 0 refills | Status: DC | PRN

## 2023-10-09 NOTE — Telephone Encounter (Signed)
Patient called to request refill for Sildenafil 100 mg. Pharmacy is [facility].

## 2023-10-23 ENCOUNTER — Other Ambulatory Visit (INDEPENDENT_AMBULATORY_CARE_PROVIDER_SITE_OTHER): Payer: Self-pay | Admitting: Nurse Practitioner

## 2023-11-20 DIAGNOSIS — M21622 Bunionette of left foot: Secondary | ICD-10-CM | POA: Diagnosis not present

## 2023-11-20 DIAGNOSIS — M79672 Pain in left foot: Secondary | ICD-10-CM | POA: Diagnosis not present

## 2023-11-20 DIAGNOSIS — E119 Type 2 diabetes mellitus without complications: Secondary | ICD-10-CM | POA: Diagnosis not present

## 2023-11-20 DIAGNOSIS — M21621 Bunionette of right foot: Secondary | ICD-10-CM | POA: Diagnosis not present

## 2023-11-20 DIAGNOSIS — I739 Peripheral vascular disease, unspecified: Secondary | ICD-10-CM | POA: Diagnosis not present

## 2023-11-20 DIAGNOSIS — L851 Acquired keratosis [keratoderma] palmaris et plantaris: Secondary | ICD-10-CM | POA: Diagnosis not present

## 2023-12-26 DIAGNOSIS — H6122 Impacted cerumen, left ear: Secondary | ICD-10-CM | POA: Diagnosis not present

## 2023-12-26 DIAGNOSIS — M542 Cervicalgia: Secondary | ICD-10-CM | POA: Diagnosis not present

## 2024-01-10 DIAGNOSIS — Z720 Tobacco use: Secondary | ICD-10-CM | POA: Diagnosis not present

## 2024-01-10 DIAGNOSIS — E782 Mixed hyperlipidemia: Secondary | ICD-10-CM | POA: Diagnosis not present

## 2024-01-10 DIAGNOSIS — Z955 Presence of coronary angioplasty implant and graft: Secondary | ICD-10-CM | POA: Diagnosis not present

## 2024-01-10 DIAGNOSIS — I10 Essential (primary) hypertension: Secondary | ICD-10-CM | POA: Diagnosis not present

## 2024-01-17 ENCOUNTER — Other Ambulatory Visit: Payer: Self-pay

## 2024-01-17 DIAGNOSIS — N529 Male erectile dysfunction, unspecified: Secondary | ICD-10-CM

## 2024-01-17 MED ORDER — SILDENAFIL CITRATE 100 MG PO TABS
100.0000 mg | ORAL_TABLET | ORAL | 0 refills | Status: DC | PRN
Start: 2024-01-17 — End: 2024-02-06

## 2024-02-05 DIAGNOSIS — J432 Centrilobular emphysema: Secondary | ICD-10-CM | POA: Diagnosis not present

## 2024-02-05 DIAGNOSIS — H6122 Impacted cerumen, left ear: Secondary | ICD-10-CM | POA: Diagnosis not present

## 2024-02-05 DIAGNOSIS — H8110 Benign paroxysmal vertigo, unspecified ear: Secondary | ICD-10-CM | POA: Diagnosis not present

## 2024-02-05 DIAGNOSIS — E119 Type 2 diabetes mellitus without complications: Secondary | ICD-10-CM | POA: Diagnosis not present

## 2024-02-05 DIAGNOSIS — E1151 Type 2 diabetes mellitus with diabetic peripheral angiopathy without gangrene: Secondary | ICD-10-CM | POA: Diagnosis not present

## 2024-02-05 DIAGNOSIS — H9193 Unspecified hearing loss, bilateral: Secondary | ICD-10-CM | POA: Diagnosis not present

## 2024-02-05 NOTE — Progress Notes (Unsigned)
 02/06/2024 9:48 AM   [name] October 28, 1956 [id number]  Referring provider: Danella Penton, MD [phone] [facility] [address] [facility] [place],  [place] [address]  Urological history: 1.  Erectile dysfunction -Contributing factors of age, BPH, smoking, alcohol consumption, diabetes, CAD, HTN, lumbar disc disease, anticoagulation therapy and HLD -testosterone level (12/2022) 539 -failing PDE5i's   2. BPH w/ LU TS -PSA (04/2023) 0.41  Chief Complaint  Patient presents with   Follow-up   HPI: Hector Garcia is a 68 y.o. male who presents today for medication refill.   Previous records reviewed.     I PSS 2/1  He has an occasional early morning hesitancy, otherwise no complaints.  Patient denies any modifying or aggravating factors.  Patient denies any recent UTI's, gross hematuria, dysuria or suprapubic/flank pain.  Patient denies any fevers, chills, nausea or vomiting.     IPSS     Row Name 02/06/24 0900         International Prostate Symptom Score   How often have you had the sensation of not emptying your bladder? Not at All     How often have you had to urinate less than every two hours? Not at All     How often have you found you stopped and started again several times when you urinated? Not at All     How often have you found it difficult to postpone urination? Not at All     How often have you had a weak urinary stream? Less than 1 in 5 times     How often have you had to strain to start urination? Not at All     How many times did you typically get up at night to urinate? 1 Time     Total IPSS Score 2       Quality of Life due to urinary symptoms   If you were to spend the rest of your life with your urinary condition just the way it is now how would you feel about that? Pleased              Score:  1-7 Mild 8-19 Moderate 20-35 Severe   SHIM 13  He is taking sildenafil 100 mg daily.  He is only seen a mild improvement in  his erectile dysfunction.  Patient is not having spontaneous erections.  He denies any pain or curvature with erections.     SHIM     Row Name 02/06/24 [phone]         SHIM: Over the last 6 months:   How do you rate your confidence that you could get and keep an erection? Very Low     When you had erections with sexual stimulation, how often were your erections hard enough for penetration (entering your partner)? Sometimes (about half the time)     During sexual intercourse, how often were you able to maintain your erection after you had penetrated (entered) your partner? Sometimes (about half the time)     During sexual intercourse, how difficult was it to maintain your erection to completion of intercourse? Difficult     When you attempted sexual intercourse, how often was it satisfactory for you? Sometimes (about half the time)       SHIM Total Score   SHIM 13               Score: 1-7 Severe ED 8-11 Moderate ED 12-16 Mild-Moderate ED 17-21 Mild ED 22-25  No ED    PMH: Past Medical History:  Diagnosis Date   CAD (coronary artery disease)    Diabetes mellitus without complication (HCC)    Hx of dysplastic nevus 11/28/2007   R lat abdomen ant side   Hx of dysplastic nevus 04/05/2016   R of midline sacral, mild   Hx of dysplastic nevus 07/02/2019   R sacral post waistline, severe atypia   Hx of dysplastic nevus 05/08/2019   L sacral post waistline, moderate atypia   Hx of melanoma in situ 04/09/2020   Left cheek, txted with MOHs Dr. Lorn Junes   Hyperlipidemia    Hypertension    Pemphigus     Surgical History: Past Surgical History:  Procedure Laterality Date   CORONARY STENT INTERVENTION N/A 08/26/2019   Procedure: CORONARY STENT INTERVENTION;  Surgeon: Marcina Millard, MD;  Location: [facility];  Service: Cardiovascular;  Laterality: N/A;   fisture surgery  1979   LEFT HEART CATH AND CORONARY ANGIOGRAPHY Left 08/26/2019   Procedure: LEFT HEART CATH  AND CORONARY ANGIOGRAPHY;  Surgeon: Marcina Millard, MD;  Location: [facility];  Service: Cardiovascular;  Laterality: Left;   LOWER EXTREMITY ANGIOGRAPHY Left 08/27/2021   Procedure: LOWER EXTREMITY ANGIOGRAPHY;  Surgeon: Renford Dills, MD;  Location: [facility];  Service: Cardiovascular;  Laterality: Left;   LOWER EXTREMITY ANGIOGRAPHY Left 07/06/2022   Procedure: Lower Extremity Angiography;  Surgeon: Renford Dills, MD;  Location: [facility];  Service: Cardiovascular;  Laterality: Left;   LOWER EXTREMITY ANGIOGRAPHY Right 11/02/2022   Procedure: Lower Extremity Angiography;  Surgeon: Renford Dills, MD;  Location: [facility];  Service: Cardiovascular;  Laterality: Right;   ROTATOR CUFF REPAIR     TRIGGER FINGER RELEASE     x several    Home Medications:  Allergies as of 02/06/2024   No Known Allergies      Medication List        Accurate as of February 06, 2024  9:48 AM. If you have any questions, ask your nurse or doctor.          STOP taking these medications    clopidogrel 75 MG tablet Commonly known as: PLAVIX Stopped by: Conya Ellinwood       TAKE these medications    amLODipine 10 MG tablet Commonly known as: NORVASC Take 10 mg by mouth daily.   ascorbic acid 500 MG tablet Commonly known as: VITAMIN C Take 500 mg by mouth daily.   aspirin EC 81 MG tablet Take 81 mg by mouth at bedtime.   Bio-Flax 1000 MG Caps Take 1,000 mg by mouth daily.   Coenzyme Q10 100 MG capsule Take by mouth.   DRISTAN NA Place 1 spray into the nose daily as needed (allergies).   Fish Oil 1000 MG Cpdr Take 1,000 mg by mouth daily.   gabapentin 300 MG capsule Commonly known as: NEURONTIN 1 capsule by mouth 3 times daily   glipiZIDE 10 MG 24 hr tablet Commonly known as: GLUCOTROL XL Take 1 tablet by mouth daily.   Jardiance 10 MG Tabs tablet Generic drug: empagliflozin Take 10 mg by mouth daily.    lisinopril-hydrochlorothiazide 20-25 MG tablet Commonly known as: ZESTORETIC Take 1 tablet by mouth daily.   metFORMIN 500 MG tablet Commonly known as: GLUCOPHAGE Take 500 mg by mouth 2 (two) times daily with a meal.   metoprolol succinate 25 MG 24 hr tablet Commonly known as: TOPROL-XL Take 0.5 tablets (  12.5 mg total) by mouth daily. What changed: how much to take   niacin 500 MG ER tablet Commonly known as: VITAMIN B3 Take 500 mg by mouth at bedtime.   nitroGLYCERIN 0.4 MG SL tablet Commonly known as: NITROSTAT Place under the tongue.   pantoprazole 40 MG tablet Commonly known as: Protonix Take 1 tablet (40 mg total) by mouth daily. Additional refills per patient's primary service   pramipexole 0.125 MG tablet Commonly known as: MIRAPEX Take 0.125 mg by mouth at bedtime.   rosuvastatin 20 MG tablet Commonly known as: CRESTOR Take 20 mg by mouth at bedtime.   sildenafil 100 MG tablet Commonly known as: VIAGRA Take 1 tablet (100 mg total) by mouth as needed for erectile dysfunction.   Trulicity 0.75 MG/0.5ML Soaj Generic drug: Dulaglutide Inject 0.75 mg into the skin.   zolpidem 10 MG tablet Commonly known as: AMBIEN Take 5 mg by mouth at bedtime as needed for sleep.        Allergies: No Known Allergies  Family History: Family History  Problem Relation Age of Onset   Benign prostatic hyperplasia Brother    Prostate cancer Neg Hx    Bladder Cancer Neg Hx    Kidney cancer Neg Hx     Social History:  reports that he has been smoking cigarettes. He has never used smokeless tobacco. He reports current alcohol use of about 1.0 standard drink of alcohol per week. He reports that he does not use drugs.  ROS: Pertinent ROS in HPI  Physical Exam: BP 133/81   Pulse 67   Ht 6' (1.829 m)   Wt 204 lb (92.5 kg)   BMI 27.67 kg/m   Constitutional:  Well nourished. Alert and oriented, No acute distress. HEENT: [place] AT, moist mucus membranes.  Trachea  midline Cardiovascular: No clubbing, cyanosis, or edema. Respiratory: Normal respiratory effort, no increased work of breathing. Neurologic: Grossly intact, no focal deficits, moving all 4 extremities. Psychiatric: Normal mood and affect.   Laboratory Data: CBC w/auto Differential (5 Part) Order: [id number] Component Ref Range & Units 3 mo ago  WBC (White Blood Cell Count) 4.1 - 10.2 10^3/uL 13.3 High   RBC (Red Blood Cell Count) 4.69 - 6.13 10^6/uL 4.97  Hemoglobin 14.1 - 18.1 gm/dL 16  Hematocrit 19.1 - 47.8 % 46.5  MCV (Mean Corpuscular Volume) 80.0 - 100.0 fl 93.6  MCH (Mean Corpuscular Hemoglobin) 27.0 - 31.2 pg 32.2 High   MCHC (Mean Corpuscular Hemoglobin Concentration) 32.0 - 36.0 gm/dL 29.5  Platelet Count 621 - 450 10^3/uL 215  RDW-CV (Red Cell Distribution Width) 11.6 - 14.8 % 12.7  MPV (Mean Platelet Volume) 9.4 - 12.4 fl 9.6  Neutrophils 1.50 - 7.80 10^3/uL 9.61 High   Lymphocytes 1.00 - 3.60 10^3/uL 2.5  Monocytes 0.00 - 1.50 10^3/uL 0.8  Eosinophils 0.00 - 0.55 10^3/uL 0.27  Basophils 0.00 - 0.09 10^3/uL 0.07  Neutrophil % 32.0 - 70.0 % 72.5 High   Lymphocyte % 10.0 - 50.0 % 18.8  Monocyte % 4.0 - 13.0 % 6  Eosinophil % 1.0 - 5.0 % 2  Basophil% 0.0 - 2.0 % 0.5  Immature Granulocyte % <=0.7 % 0.2  Immature Granulocyte Count <=0.06 10^3/L 0.03  Resulting Agency [facility]   Specimen Collected: 10/16/23 09:18   Performed by: [facility] Last Resulted: 10/16/23 10:24  Received From: [facility]  Result Received: 01/17/24 13:00    Comprehensive Metabolic Panel (CMP) Order: [id number] Component  Ref Range & Units 3 mo ago  Glucose 70 - 110 mg/dL 782 High   Sodium 956 - 145 mmol/L 141  Potassium 3.6 - 5.1 mmol/L 4  Chloride 97 - 109 mmol/L 106  Carbon Dioxide (CO2) 22.0 - 32.0 mmol/L 24.1  Urea Nitrogen (BUN) 7 - 25 mg/dL 20  Creatinine 0.7 - 1.3 mg/dL 1  Glomerular Filtration Rate  (eGFR) >60 mL/min/1.73sq m 84  Comment: CKD-EPI (2021) does not include patient's race in the calculation of eGFR.  Monitoring changes of plasma creatinine and eGFR over time is useful for monitoring kidney function.  Interpretive Ranges for eGFR (CKD-EPI 2021):  eGFR:       >60 mL/min/1.73 sq. m - Normal eGFR:       30-59 mL/min/1.73 sq. m - Moderately Decreased eGFR:       15-29 mL/min/1.73 sq. m  - Severely Decreased eGFR:       < 15 mL/min/1.73 sq. m  - Kidney Failure   Note: These eGFR calculations do not apply in acute situations when eGFR is changing rapidly or patients on dialysis.  Calcium 8.7 - 10.3 mg/dL 9.5  AST 8 - 39 U/L 16  ALT 6 - 57 U/L 18  Alk Phos (alkaline Phosphatase) 34 - 104 U/L 54  Albumin 3.5 - 4.8 g/dL 4.4  Bilirubin, Total 0.3 - 1.2 mg/dL 0.6  Protein, Total 6.1 - 7.9 g/dL 7  A/G Ratio 1.0 - 5.0 gm/dL 1.7  Resulting Agency [facility]   Specimen Collected: 10/16/23 09:18   Performed by: [facility] Last Resulted: 10/16/23 12:51  Received From: [facility]  Result Received: 01/17/24 13:00    Hemoglobin A1C Order: [id number] Component Ref Range & Units 3 mo ago  Hemoglobin A1C 4.2 - 5.6 % 6.8 High   Average Blood Glucose (Calc) mg/dL 469  Resulting Agency [facility]  Narrative Performed by [facility] Normal Range:    4.2 - 5.6% Increased Risk:  5.7 - 6.4% Diabetes:        >= 6.5% Glycemic Control for adults with diabetes:  <7%    Specimen Collected: 10/16/23 09:18   Performed by: [facility] Last Resulted: 10/16/23 18:00  Received From: [facility]  Result Received: 01/17/24 13:00    ontains abnormal data Lipid Panel w/calc LDL Order: [id number] Component Ref Range & Units 3 mo ago  Cholesterol, Total 100 - 200 mg/dL 244  Triglyceride 35 - 199 mg/dL 010 High   HDL (High Density Lipoprotein) Cholesterol 29.0 - 71.0 mg/dL  27.2 Low   LDL Calculated 0 - 130 mg/dL 28  VLDL Cholesterol mg/dL 53  Cholesterol/HDL Ratio 4.9  Resulting Agency [facility]   Specimen Collected: 10/16/23 09:18   Performed by: [facility] Last Resulted: 10/16/23 12:51  Received From: [facility]  Result Received: 01/17/24 13:00  I have reviewed the labs.   Pertinent Imaging: N/A  Assessment & Plan:    1. ED -Since he is having suboptimal results with sildenafil 100 mg daily, we discussed IPP, but he is still somewhat hesitant to go forward -I given him names of local surgeons that perform the IPP at high-volume so he can research -I also gave him a video to watch via MyChart -He will contact me if he would like a referral -I also advised him that since he is taking sildenafil  100 mg daily he cannot use his nitroglycerin sublingual tablets as this will result in a fatal drop in blood pressure, he will have to call 911 when having chest pain  2. BPH with LU TS -PSA screening up to date  Return in about 1 year (around 02/05/2025) for I PSS, SHIM .  These notes generated with voice recognition software. I apologize for typographical errors.  Cloretta Ned  [facility] [address]  [address] [place], [place] [address] [phone]

## 2024-02-06 ENCOUNTER — Encounter: Payer: Self-pay | Admitting: Urology

## 2024-02-06 ENCOUNTER — Ambulatory Visit: Admitting: Urology

## 2024-02-06 VITALS — BP 133/81 | HR 67 | Ht 72.0 in | Wt 204.0 lb

## 2024-02-06 DIAGNOSIS — N138 Other obstructive and reflux uropathy: Secondary | ICD-10-CM

## 2024-02-06 DIAGNOSIS — N401 Enlarged prostate with lower urinary tract symptoms: Secondary | ICD-10-CM | POA: Diagnosis not present

## 2024-02-06 DIAGNOSIS — N529 Male erectile dysfunction, unspecified: Secondary | ICD-10-CM | POA: Diagnosis not present

## 2024-02-06 MED ORDER — SILDENAFIL CITRATE 100 MG PO TABS: 100.0000 mg | ORAL_TABLET | ORAL | 3 refills | Status: AC | PRN

## 2024-02-06 NOTE — Patient Instructions (Signed)
 Dr. Loura Pardon and Dr. Kathrynn Speed at [facility]  and Dr. Modena Slater at [facility] in [place]

## 2024-02-08 DIAGNOSIS — M48062 Spinal stenosis, lumbar region with neurogenic claudication: Secondary | ICD-10-CM | POA: Diagnosis not present

## 2024-02-08 DIAGNOSIS — M5416 Radiculopathy, lumbar region: Secondary | ICD-10-CM | POA: Diagnosis not present

## 2024-02-19 ENCOUNTER — Ambulatory Visit (INDEPENDENT_AMBULATORY_CARE_PROVIDER_SITE_OTHER): Payer: Medicare HMO | Admitting: Vascular Surgery

## 2024-02-19 ENCOUNTER — Encounter (INDEPENDENT_AMBULATORY_CARE_PROVIDER_SITE_OTHER): Payer: Medicare HMO

## 2024-02-21 DIAGNOSIS — I10 Essential (primary) hypertension: Secondary | ICD-10-CM | POA: Diagnosis not present

## 2024-02-21 DIAGNOSIS — R0789 Other chest pain: Secondary | ICD-10-CM | POA: Diagnosis not present

## 2024-02-21 DIAGNOSIS — E782 Mixed hyperlipidemia: Secondary | ICD-10-CM | POA: Diagnosis not present

## 2024-02-21 DIAGNOSIS — Z955 Presence of coronary angioplasty implant and graft: Secondary | ICD-10-CM | POA: Diagnosis not present

## 2024-02-21 DIAGNOSIS — I25118 Atherosclerotic heart disease of native coronary artery with other forms of angina pectoris: Secondary | ICD-10-CM | POA: Diagnosis not present

## 2024-02-29 DIAGNOSIS — J310 Chronic rhinitis: Secondary | ICD-10-CM | POA: Diagnosis not present

## 2024-02-29 DIAGNOSIS — H90A21 Sensorineural hearing loss, unilateral, right ear, with restricted hearing on the contralateral side: Secondary | ICD-10-CM | POA: Diagnosis not present

## 2024-02-29 DIAGNOSIS — R42 Dizziness and giddiness: Secondary | ICD-10-CM | POA: Diagnosis not present

## 2024-02-29 DIAGNOSIS — H9121 Sudden idiopathic hearing loss, right ear: Secondary | ICD-10-CM | POA: Diagnosis not present

## 2024-03-05 DIAGNOSIS — M48062 Spinal stenosis, lumbar region with neurogenic claudication: Secondary | ICD-10-CM | POA: Diagnosis not present

## 2024-03-05 DIAGNOSIS — M5126 Other intervertebral disc displacement, lumbar region: Secondary | ICD-10-CM | POA: Diagnosis not present

## 2024-03-05 DIAGNOSIS — M5416 Radiculopathy, lumbar region: Secondary | ICD-10-CM | POA: Diagnosis not present

## 2024-03-05 DIAGNOSIS — E119 Type 2 diabetes mellitus without complications: Secondary | ICD-10-CM | POA: Diagnosis not present

## 2024-03-11 ENCOUNTER — Ambulatory Visit (INDEPENDENT_AMBULATORY_CARE_PROVIDER_SITE_OTHER): Admitting: Vascular Surgery

## 2024-03-11 ENCOUNTER — Encounter (INDEPENDENT_AMBULATORY_CARE_PROVIDER_SITE_OTHER)

## 2024-03-12 ENCOUNTER — Encounter: Payer: Medicare HMO | Admitting: Dermatology

## 2024-03-13 DIAGNOSIS — I10 Essential (primary) hypertension: Secondary | ICD-10-CM | POA: Diagnosis not present

## 2024-03-13 DIAGNOSIS — Z955 Presence of coronary angioplasty implant and graft: Secondary | ICD-10-CM | POA: Diagnosis not present

## 2024-03-13 DIAGNOSIS — I25118 Atherosclerotic heart disease of native coronary artery with other forms of angina pectoris: Secondary | ICD-10-CM | POA: Diagnosis not present

## 2024-03-19 ENCOUNTER — Encounter: Payer: Medicare HMO | Admitting: Dermatology

## 2024-03-21 DIAGNOSIS — R42 Dizziness and giddiness: Secondary | ICD-10-CM | POA: Diagnosis not present

## 2024-03-21 DIAGNOSIS — H9121 Sudden idiopathic hearing loss, right ear: Secondary | ICD-10-CM | POA: Diagnosis not present

## 2024-04-02 ENCOUNTER — Encounter (INDEPENDENT_AMBULATORY_CARE_PROVIDER_SITE_OTHER): Payer: Self-pay

## 2024-04-10 ENCOUNTER — Other Ambulatory Visit (INDEPENDENT_AMBULATORY_CARE_PROVIDER_SITE_OTHER): Payer: Self-pay | Admitting: Vascular Surgery

## 2024-04-10 DIAGNOSIS — I739 Peripheral vascular disease, unspecified: Secondary | ICD-10-CM

## 2024-04-13 NOTE — Progress Notes (Deleted)
 MRN : [id number]  Hector Garcia is a 68 y.o. (Jan 31, 1956) male who presents with chief complaint of check circulation.  History of Present Illness:   The patient returns to the office for followup and review status post angiogram with intervention on 11/02/2022.    Procedure: Procedure(s) Performed:             1.  Percutaneous transluminal angioplasty and stent placement right superficial femoral artery               The patient notes improvement in the lower extremity symptoms. No interval shortening of the patient's claudication distance or rest pain symptoms.  He notes that he has been playing golf without issue.  No new ulcers or wounds have occurred since the last visit.   There have been no significant changes to the patient's overall health care.   No documented history of amaurosis fugax or recent TIA symptoms. There are no recent neurological changes noted. No documented history of DVT, PE or superficial thrombophlebitis. The patient denies recent episodes of angina or shortness of breath.    ABI's Rt=1.11 and Lt=1.12  (previous ABI's Rt=1.10 and Lt=1.14) Previous duplex US  of the right lower extremity shows biphasic/triphasic waveforms with triphasic tibial artery waveforms on the left  No outpatient medications have been marked as taking for the 04/15/24 encounter (Appointment) with Prescilla Brod, Ninette Basque, MD.    Past Medical History:  Diagnosis Date   CAD (coronary artery disease)    Diabetes mellitus without complication (HCC)    Hx of dysplastic nevus 11/28/2007   R lat abdomen ant side   Hx of dysplastic nevus 04/05/2016   R of midline sacral, mild   Hx of dysplastic nevus 07/02/2019   R sacral post waistline, severe atypia   Hx of dysplastic nevus 05/08/2019   L sacral post waistline, moderate atypia   Hx of melanoma in situ 04/09/2020   Left cheek, txted with MOHs Dr. Robert Chimes    Hyperlipidemia    Hypertension    Pemphigus     Past Surgical History:  Procedure Laterality Date   CORONARY STENT INTERVENTION N/A 08/26/2019   Procedure: CORONARY STENT INTERVENTION;  Surgeon: Percival Brace, MD;  Location: [facility];  Service: Cardiovascular;  Laterality: N/A;   fisture surgery  1979   LEFT HEART CATH AND CORONARY ANGIOGRAPHY Left 08/26/2019   Procedure: LEFT HEART CATH AND CORONARY ANGIOGRAPHY;  Surgeon: Percival Brace, MD;  Location: [facility];  Service: Cardiovascular;  Laterality: Left;   LOWER EXTREMITY ANGIOGRAPHY Left 08/27/2021   Procedure: LOWER EXTREMITY ANGIOGRAPHY;  Surgeon: Jackquelyn Mass, MD;  Location: [facility];  Service: Cardiovascular;  Laterality: Left;   LOWER EXTREMITY ANGIOGRAPHY Left 07/06/2022   Procedure: Lower Extremity Angiography;  Surgeon: Jackquelyn Mass, MD;  Location: [facility];  Service: Cardiovascular;  Laterality: Left;   LOWER EXTREMITY ANGIOGRAPHY Right 11/02/2022   Procedure: Lower Extremity Angiography;  Surgeon: Jackquelyn Mass, MD;  Location: [facility];  Service: Cardiovascular;  Laterality: Right;   ROTATOR CUFF REPAIR  TRIGGER FINGER RELEASE     x several    Social History Social History   Tobacco Use   Smoking status: Every Day    Current packs/day: 0.00    Types: Cigarettes    Last attempt to quit: 11/2016    Years since quitting: 7.4   Smokeless tobacco: Never   Tobacco comments:    may smoke 1 pack a week  Vaping Use   Vaping status: Never Used  Substance Use Topics   Alcohol use: Yes    Alcohol/week: 1.0 standard drink of alcohol    Types: 1 Cans of beer per week    Comment: 1 beer weekly   Drug use: No    Family History Family History  Problem Relation Age of Onset   Benign prostatic hyperplasia Brother    Prostate cancer Neg Hx    Bladder Cancer Neg Hx    Kidney cancer Neg Hx     No Known Allergies   REVIEW OF  SYSTEMS (Negative unless checked)  Constitutional: [] Weight loss  [] Fever  [] Chills Cardiac: [] Chest pain   [] Chest pressure   [] Palpitations   [] Shortness of breath when laying flat   [] Shortness of breath with exertion. Vascular:  [x] Pain in legs with walking   [] Pain in legs at rest  [] History of DVT   [] Phlebitis   [] Swelling in legs   [] Varicose veins   [] Non-healing ulcers Pulmonary:   [] Uses home oxygen    [] Productive cough   [] Hemoptysis   [] Wheeze  [] COPD   [] Asthma Neurologic:  [] Dizziness   [] Seizures   [] History of stroke   [] History of TIA  [] Aphasia   [] Vissual changes   [] Weakness or numbness in arm   [] Weakness or numbness in leg Musculoskeletal:   [] Joint swelling   [] Joint pain   [] Low back pain Hematologic:  [] Easy bruising  [] Easy bleeding   [] Hypercoagulable state   [] Anemic Gastrointestinal:  [] Diarrhea   [] Vomiting  [] Gastroesophageal reflux/heartburn   [] Difficulty swallowing. Genitourinary:  [] Chronic kidney disease   [] Difficult urination  [] Frequent urination   [] Blood in urine Skin:  [] Rashes   [] Ulcers  Psychological:  [] History of anxiety   []  History of major depression.  Physical Examination  There were no vitals filed for this visit. There is no height or weight on file to calculate BMI. Gen: WD/WN, NAD Head: [place]/AT, No temporalis wasting.  Ear/Nose/Throat: Hearing grossly intact, nares w/o erythema or drainage Eyes: PER, EOMI, sclera nonicteric.  Neck: Supple, no masses.  No bruit or JVD.  Pulmonary:  Good air movement, no audible wheezing, no use of accessory muscles.  Cardiac: RRR, normal S1, S2, no Murmurs. Vascular:  mild trophic changes, no open wounds Vessel Right Left  Radial Palpable Palpable  PT Not Palpable Not Palpable  DP Not Palpable Not Palpable  Gastrointestinal: soft, non-distended. No guarding/no peritoneal signs.  Musculoskeletal: M/S 5/5 throughout.  No visible deformity.  Neurologic: CN 2-12 intact. Pain and light touch intact in  extremities.  Symmetrical.  Speech is fluent. Motor exam as listed above. Psychiatric: Judgment intact, Mood & affect appropriate for pt's clinical situation. Dermatologic: No rashes or ulcers noted.  No changes consistent with cellulitis.   CBC Lab Results  Component Value Date   WBC 13.5 (H) 08/27/2019   HGB 13.0 08/27/2019   HCT 39.8 08/27/2019   MCV 88.6 08/27/2019   PLT 247 08/27/2019    BMET    Component Value Date/Time   NA 137 08/27/2019 0412   K 4.1  08/27/2019 0412   CL 105 08/27/2019 0412   CO2 23 08/27/2019 0412   GLUCOSE 179 (H) 08/27/2019 0412   BUN 22 11/02/2022 1228   CREATININE 0.92 11/02/2022 1228   CALCIUM  8.9 08/27/2019 0412   GFRNONAA >60 11/02/2022 1228   GFRAA >60 08/27/2019 0412   CrCl cannot be calculated (Patient's most recent lab result is older than the maximum 21 days allowed.).  COAG No results found for: "INR", "PROTIME"  Radiology No results found.   Assessment/Plan There are no diagnoses linked to this encounter.   Devon Fogo, MD  04/13/2024 3:57 PM

## 2024-04-15 ENCOUNTER — Encounter (INDEPENDENT_AMBULATORY_CARE_PROVIDER_SITE_OTHER)

## 2024-04-15 ENCOUNTER — Ambulatory Visit (INDEPENDENT_AMBULATORY_CARE_PROVIDER_SITE_OTHER): Admitting: Vascular Surgery

## 2024-04-15 DIAGNOSIS — E119 Type 2 diabetes mellitus without complications: Secondary | ICD-10-CM

## 2024-04-15 DIAGNOSIS — E782 Mixed hyperlipidemia: Secondary | ICD-10-CM

## 2024-04-15 DIAGNOSIS — I70211 Atherosclerosis of native arteries of extremities with intermittent claudication, right leg: Secondary | ICD-10-CM

## 2024-04-15 DIAGNOSIS — I10 Essential (primary) hypertension: Secondary | ICD-10-CM

## 2024-04-15 DIAGNOSIS — I25119 Atherosclerotic heart disease of native coronary artery with unspecified angina pectoris: Secondary | ICD-10-CM

## 2024-04-16 ENCOUNTER — Ambulatory Visit: Admitting: Dermatology

## 2024-04-16 ENCOUNTER — Encounter: Payer: Self-pay | Admitting: Dermatology

## 2024-04-16 DIAGNOSIS — W908XXA Exposure to other nonionizing radiation, initial encounter: Secondary | ICD-10-CM

## 2024-04-16 DIAGNOSIS — L578 Other skin changes due to chronic exposure to nonionizing radiation: Secondary | ICD-10-CM | POA: Diagnosis not present

## 2024-04-16 DIAGNOSIS — L560 Drug phototoxic response: Secondary | ICD-10-CM | POA: Diagnosis not present

## 2024-04-16 DIAGNOSIS — L568 Other specified acute skin changes due to ultraviolet radiation: Secondary | ICD-10-CM

## 2024-04-16 DIAGNOSIS — Z1283 Encounter for screening for malignant neoplasm of skin: Secondary | ICD-10-CM

## 2024-04-16 DIAGNOSIS — L814 Other melanin hyperpigmentation: Secondary | ICD-10-CM

## 2024-04-16 DIAGNOSIS — L821 Other seborrheic keratosis: Secondary | ICD-10-CM

## 2024-04-16 DIAGNOSIS — B351 Tinea unguium: Secondary | ICD-10-CM

## 2024-04-16 DIAGNOSIS — Q828 Other specified congenital malformations of skin: Secondary | ICD-10-CM

## 2024-04-16 DIAGNOSIS — X32XXXA Exposure to sunlight, initial encounter: Secondary | ICD-10-CM | POA: Diagnosis not present

## 2024-04-16 DIAGNOSIS — D1801 Hemangioma of skin and subcutaneous tissue: Secondary | ICD-10-CM | POA: Diagnosis not present

## 2024-04-16 DIAGNOSIS — L570 Actinic keratosis: Secondary | ICD-10-CM

## 2024-04-16 DIAGNOSIS — D229 Melanocytic nevi, unspecified: Secondary | ICD-10-CM

## 2024-04-16 NOTE — Patient Instructions (Signed)

## 2024-04-16 NOTE — Progress Notes (Signed)
 Follow-Up Visit   Subjective  Hector Garcia is a 68 y.o. male who presents for the following: Skin Cancer Screening and Full Body Skin Exam  The patient presents for Total-Body Skin Exam (TBSE) for skin cancer screening and mole check. The patient has spots, moles and lesions to be evaluated, some may be new or changing and the patient may have concern these could be cancer.   The following portions of the chart were reviewed this encounter and updated as appropriate: medications, allergies, medical history  Review of Systems:  No other skin or systemic complaints except as noted in HPI or Assessment and Plan.  Objective  Well appearing patient in no apparent distress; mood and affect are within normal limits.  A full examination was performed including scalp, head, eyes, ears, nose, lips, neck, chest, axillae, abdomen, back, buttocks, bilateral upper extremities, bilateral lower extremities, hands, feet, fingers, toes, fingernails, and toenails. All findings within normal limits unless otherwise noted below.   Relevant physical exam findings are noted in the Assessment and Plan.  L sup forehead x 2 (2) Erythematous thin papules/macules with gritty scale.   Assessment & Plan   SKIN CANCER SCREENING PERFORMED TODAY.  ACTINIC DAMAGE - Chronic condition, secondary to cumulative UV/sun exposure - diffuse scaly erythematous macules with underlying dyspigmentation - Recommend daily broad spectrum sunscreen SPF 30+ to sun-exposed areas, reapply every 2 hours as needed.  - Staying in the shade or wearing long sleeves, sun glasses (UVA+UVB protection) and wide brim hats (4-inch brim around the entire circumference of the hat) are also recommended for sun protection.  - Call for new or changing lesions.  LENTIGINES, SEBORRHEIC KERATOSES, HEMANGIOMAS - Benign normal skin lesions - Benign-appearing - Call for any changes  MELANOCYTIC NEVI - Tan-brown and/or pink-flesh-colored symmetric  macules and papules - Benign appearing on exam today - Observation - Call clinic for new or changing moles - Recommend daily use of broad spectrum spf 30+ sunscreen to sun-exposed areas.   HISTORY OF MELANOMA IN SITU. Left cheek. Mohs 04/09/2020. - No evidence of recurrence today - No lymphadenopathy - Recommend regular full body skin exams - Recommend daily broad spectrum sunscreen SPF 30+ to sun-exposed areas, reapply every 2 hours as needed.  - Call if any new or changing lesions are noted between office visits    HISTORY OF DYSPLASTIC NEVI. Multiple, see history.  No evidence of recurrence today Recommend regular full body skin exams Recommend daily broad spectrum sunscreen SPF 30+ to sun-exposed areas, reapply every 2 hours as needed.  Call if any new or changing lesions are noted between office visits    Porokeratosis  Exam: annular scaly pink plaque with erosions from trauma on left lower leg Treatment:  Benign-appearing.  Observation.  Call clinic for new or changing lesions.  Recommend daily use of broad spectrum spf 30+ sunscreen to sun-exposed areas.    Lentigo vs other   Exam: medium brown macule at right chest.  Patient denies changes. States has been here at least 1 year.    Treatment: The patient will observe these symptoms, and report promptly any changes.   Phototoxicity Diffuse desquamation on trunk. Patient pressure [place] the beach. Encouraged sun protection  AK (ACTINIC KERATOSIS) (2) L sup forehead x 2 (2) Actinic keratoses are precancerous spots that appear secondary to cumulative UV radiation exposure/sun exposure over time. They are chronic with expected duration over 1 year. A portion of actinic keratoses will progress to squamous cell carcinoma of  the skin. It is not possible to reliably predict which spots will progress to skin cancer and so treatment is recommended to prevent development of skin cancer.  Recommend daily broad spectrum sunscreen  SPF 30+ to sun-exposed areas, reapply every 2 hours as needed.  Recommend staying in the shade or wearing long sleeves, sun glasses (UVA+UVB protection) and wide brim hats (4-inch brim around the entire circumference of the hat). Call for new or changing lesions. Destruction of lesion - L sup forehead x 2 (2) Complexity: simple   Destruction method: cryotherapy   Informed consent: discussed and consent obtained   Timeout:  patient name, date of birth, surgical site, and procedure verified Lesion destroyed using liquid nitrogen: Yes   Region frozen until ice ball extended beyond lesion: Yes   Outcome: patient tolerated procedure well with no complications   Post-procedure details: wound care instructions given   MULTIPLE BENIGN NEVI   LENTIGINES   ACTINIC ELASTOSIS   SEBORRHEIC KERATOSES   CHERRY ANGIOMA   PHOTOTOXICITY    ONYCHOMYCOSIS - pt has tried and failed Terbinafine  in 2023 Exam: Thickened toenails with subungal debris c/w onychomycosis  Chronic and persistent condition with duration or expected duration over one year. Condition is symptomatic/ bothersome to patient. Not currently at goal.  Treatment Plan: Discussed oral Ketoconazole  QW x 1 year, and potential side effects of metallic taste in the mouth, irritation of the liver, and rash. Pt defers at this time due to potential side effects.  Return in about 6 months (around 10/16/2024) for TBSE - Hx MM, dysplastic nevi.  Arlinda Lais, CMA, am acting as scribe for Harris Liming, MD .  Documentation: I have reviewed the above documentation for accuracy and completeness, and I agree with the above.  Harris Liming, MD

## 2024-04-17 DIAGNOSIS — R42 Dizziness and giddiness: Secondary | ICD-10-CM | POA: Diagnosis not present

## 2024-04-22 ENCOUNTER — Other Ambulatory Visit: Payer: Self-pay | Admitting: Otolaryngology

## 2024-04-22 DIAGNOSIS — H9121 Sudden idiopathic hearing loss, right ear: Secondary | ICD-10-CM

## 2024-04-25 DIAGNOSIS — E119 Type 2 diabetes mellitus without complications: Secondary | ICD-10-CM | POA: Diagnosis not present

## 2024-04-25 DIAGNOSIS — Z125 Encounter for screening for malignant neoplasm of prostate: Secondary | ICD-10-CM | POA: Diagnosis not present

## 2024-05-02 DIAGNOSIS — M5116 Intervertebral disc disorders with radiculopathy, lumbar region: Secondary | ICD-10-CM | POA: Diagnosis not present

## 2024-05-02 DIAGNOSIS — E538 Deficiency of other specified B group vitamins: Secondary | ICD-10-CM | POA: Diagnosis not present

## 2024-05-02 DIAGNOSIS — Z79899 Other long term (current) drug therapy: Secondary | ICD-10-CM | POA: Diagnosis not present

## 2024-05-02 DIAGNOSIS — Z0000 Encounter for general adult medical examination without abnormal findings: Secondary | ICD-10-CM | POA: Diagnosis not present

## 2024-05-02 DIAGNOSIS — E119 Type 2 diabetes mellitus without complications: Secondary | ICD-10-CM | POA: Diagnosis not present

## 2024-05-02 DIAGNOSIS — I739 Peripheral vascular disease, unspecified: Secondary | ICD-10-CM | POA: Diagnosis not present

## 2024-05-02 DIAGNOSIS — L100 Pemphigus vulgaris: Secondary | ICD-10-CM | POA: Diagnosis not present

## 2024-05-09 ENCOUNTER — Ambulatory Visit
Admission: RE | Admit: 2024-05-09 | Discharge: 2024-05-09 | Disposition: A | Discharge status: Home / Self Care | Source: Ambulatory Visit | Attending: Otolaryngology | Admitting: Otolaryngology

## 2024-05-09 DIAGNOSIS — R9082 White matter disease, unspecified: Secondary | ICD-10-CM | POA: Diagnosis not present

## 2024-05-09 DIAGNOSIS — H9121 Sudden idiopathic hearing loss, right ear: Secondary | ICD-10-CM

## 2024-05-09 MED ORDER — GADOPICLENOL 0.5 MMOL/ML IV SOLN
10.0000 mL | Freq: Once | INTRAVENOUS | Status: AC | PRN
  Administered 2024-05-09: 10 mL via INTRAVENOUS

## 2024-05-31 DIAGNOSIS — H903 Sensorineural hearing loss, bilateral: Secondary | ICD-10-CM | POA: Diagnosis not present

## 2024-06-05 NOTE — Progress Notes (Signed)
 MRN : [id number]  Hector Garcia is a 68 y.o. (09-11-1956) male who presents with chief complaint of check circulation.  History of Present Illness:   The patient returns to the office for followup and review status post angiogram with intervention on 11/02/2022.    Procedure: Procedure(s) Performed:             1.  Percutaneous transluminal angioplasty and stent placement right superficial femoral artery               The patient notes improvement in the lower extremity symptoms. No interval shortening of the patient's claudication distance or rest pain symptoms.  He notes that he has been playing golf without issue.  No new ulcers or wounds have occurred since the last visit.   There have been no significant changes to the patient's overall health care.   No documented history of amaurosis fugax or recent TIA symptoms. There are no recent neurological changes noted. No documented history of DVT, PE or superficial thrombophlebitis. The patient denies recent episodes of angina or shortness of breath.    ABI's Rt=1.07 and Lt=1.01  (previous ABI's Rt=1.11 and Lt=1.12) Previous duplex US  of the right lower extremity shows biphasic/triphasic waveforms with triphasic tibial artery waveforms on the left  No outpatient medications have been marked as taking for the 06/10/24 encounter (Appointment) with Jama, Cordella MATSU, MD.    Past Medical History:  Diagnosis Date   CAD (coronary artery disease)    Diabetes mellitus without complication (HCC)    Hx of dysplastic nevus 11/28/2007   R lat abdomen ant side   Hx of dysplastic nevus 04/05/2016   R of midline sacral, mild   Hx of dysplastic nevus 07/02/2019   R sacral post waistline, severe atypia   Hx of dysplastic nevus 05/08/2019   L sacral post waistline, moderate atypia   Hx of melanoma in situ 04/09/2020   Left cheek, txted with [facility]OH[facility] Dr. Gregorio    Hyperlipidemia    Hypertension    Pemphigus     Past Surgical History:  Procedure Laterality Date   CORONARY STENT INTERVENTION N/A 08/26/2019   Procedure: CORONARY STENT INTERVENTION;  Surgeon: Ammon Blunt, MD;  Location: [facility];  Service: Cardiovascular;  Laterality: N/A;   fisture surgery  1979   LEFT HEART CATH AND CORONARY ANGIOGRAPHY Left 08/26/2019   Procedure: LEFT HEART CATH AND CORONARY ANGIOGRAPHY;  Surgeon: Ammon Blunt, MD;  Location: [facility];  Service: Cardiovascular;  Laterality: Left;   LOWER EXTREMITY ANGIOGRAPHY Left 08/27/2021   Procedure: LOWER EXTREMITY ANGIOGRAPHY;  Surgeon: Jama Cordella MATSU, MD;  Location: [facility];  Service: Cardiovascular;  Laterality: Left;   LOWER EXTREMITY ANGIOGRAPHY Left 07/06/2022   Procedure: Lower Extremity Angiography;  Surgeon: Jama Cordella MATSU, MD;  Location: [facility];  Service: Cardiovascular;  Laterality: Left;   LOWER EXTREMITY ANGIOGRAPHY Right 11/02/2022   Procedure: Lower Extremity Angiography;  Surgeon: Jama Cordella MATSU, MD;  Location: [facility];  Service: Cardiovascular;  Laterality: Right;   ROTATOR CUFF REPAIR  TRIGGER FINGER RELEASE     x several    Social History Social History   Tobacco Use   Smoking status: Every Day    Current packs/day: 0.00    Types: Cigarettes    Last attempt to quit: 11/2016    Years since quitting: 7.5   Smokeless tobacco: Never   Tobacco comments:    may smoke 1 pack a week  Vaping Use   Vaping status: Never Used  Substance Use Topics   Alcohol use: Yes    Alcohol/week: 1.0 standard drink of alcohol    Types: 1 Cans of beer per week    Comment: 1 beer weekly   Drug use: No    Family History Family History  Problem Relation Age of Onset   Benign prostatic hyperplasia Brother    Prostate cancer Neg Hx    Bladder Cancer Neg Hx    Kidney cancer Neg Hx     No Known Allergies   REVIEW OF  SYSTEMS (Negative unless checked)  Constitutional: [] Weight loss  [] Fever  [] Chills Cardiac: [] Chest pain   [] Chest pressure   [] Palpitations   [] Shortness of breath when laying flat   [] Shortness of breath with exertion. Vascular:  [x] Pain in legs with walking   [] Pain in legs at rest  [] History of DVT   [] Phlebitis   [] Swelling in legs   [] Varicose veins   [] Non-healing ulcers Pulmonary:   [] Uses home oxygen    [] Productive cough   [] Hemoptysis   [] Wheeze  [] COPD   [] Asthma Neurologic:  [] Dizziness   [] Seizures   [] History of stroke   [] History of TIA  [] Aphasia   [] Vissual changes   [] Weakness or numbness in arm   [] Weakness or numbness in leg Musculoskeletal:   [] Joint swelling   [] Joint pain   [] Low back pain Hematologic:  [] Easy bruising  [] Easy bleeding   [] Hypercoagulable state   [] Anemic Gastrointestinal:  [] Diarrhea   [] Vomiting  [] Gastroesophageal reflux/heartburn   [] Difficulty swallowing. Genitourinary:  [] Chronic kidney disease   [] Difficult urination  [] Frequent urination   [] Blood in urine Skin:  [] Rashes   [] Ulcers  Psychological:  [] History of anxiety   []  History of major depression.  Physical Examination  There were no vitals filed for this visit. There is no height or weight on file to calculate BMI. Gen: WD/WN, NAD Head: [place]/AT, No temporalis wasting.  Ear/Nose/Throat: Hearing grossly intact, nares w/o erythema or drainage Eyes: PER, EOMI, sclera nonicteric.  Neck: Supple, no masses.  No bruit or JVD.  Pulmonary:  Good air movement, no audible wheezing, no use of accessory muscles.  Cardiac: RRR, normal S1, S2, no Murmurs. Vascular:  mild trophic changes, no open wounds Vessel Right Left  Radial Palpable Palpable  PT Palpable PPalpable  DP Not Palpable Not Palpable  Gastrointestinal: soft, non-distended. No guarding/no peritoneal signs.  Musculoskeletal: M/S 5/5 throughout.  No visible deformity.  Neurologic: CN 2-12 intact. Pain and light touch intact in  extremities.  Symmetrical.  Speech is fluent. Motor exam as listed above. Psychiatric: Judgment intact, Mood & affect appropriate for pt's clinical situation. Dermatologic: No rashes or ulcers noted.  No changes consistent with cellulitis.   CBC Lab Results  Component Value Date   WBC 13.5 (H) 08/27/2019   HGB 13.0 08/27/2019   HCT 39.8 08/27/2019   MCV 88.6 08/27/2019   PLT 247 08/27/2019    BMET    Component Value Date/Time   NA 137 08/27/2019 0412   K 4.1 08/27/2019 0412  CL 105 08/27/2019 0412   CO2 23 08/27/2019 0412   GLUCOSE 179 (H) 08/27/2019 0412   BUN 22 11/02/2022 1228   CREATININE 0.92 11/02/2022 1228   CALCIUM  8.9 08/27/2019 0412   GFRNONAA >60 11/02/2022 1228   GFRAA >60 08/27/2019 0412   CrCl cannot be calculated (Patient's most recent lab result is older than the maximum 21 days allowed.).  COAG No results found for: INR, PROTIME  Radiology MR [facility] ORN WO CONTRAST Result Date: 05/09/2024 CLINICAL DATA:  Provided history: Sudden right hearing loss. Additional history provided by the scanning technologist: Dizziness. EXAM: MRI HEAD WITHOUT AND WITH CONTRAST TECHNIQUE: Multiplanar, multiecho pulse sequences of the brain and surrounding structures were obtained without and with intravenous contrast. CONTRAST:  10 mL Vueway  intravenous contrast. COMPARISON:  None. FINDINGS: Brain: No age-advanced or lobar predominant cerebral atrophy. Multifocal T2 FLAIR hyperintense signal abnormality within the cerebral white matter, nonspecific but compatible with mild chronic small vessel ischemic disease. No cortical encephalomalacia is identified. No evidence of an intracranial mass. Specifically, no cerebellopontine angle or internal auditory canal mass is demonstrated. Unremarkable appearance of the 7th and 8th cranial nerves bilaterally. There is no acute infarct. No chronic intracranial blood products. No extra-axial fluid collection. No midline shift. No pathologic  intracranial enhancement identified. Vascular: Maintained flow voids within the proximal large arterial vessels. Skull and upper cervical spine: No focal worrisome marrow lesion. Sinuses/Orbits: No mass or acute finding within the imaged orbits. No significant paranasal sinus disease. IMPRESSION: 1. No cerebellopontine angle or internal auditory canal mass. 2. No etiology of hearing loss or dizziness identified. 3. Mild chronic small vessel ischemic changes within the cerebral white matter. Electronically Signed   By: Rockey Childs D.O.   On: 05/09/2024 19:44     Assessment/Plan 1. Atherosclerosis of native artery of both lower extremities with intermittent claudication (HCC) (Primary)  Recommend:  The patient has evidence of atherosclerosis of the lower extremities with claudication.  The patient does not voice lifestyle limiting changes at this point in time.  Noninvasive studies do not suggest clinically significant change.  No invasive studies, angiography or surgery at this time The patient should continue walking and begin a more formal exercise program.  The patient should continue antiplatelet therapy and aggressive treatment of the lipid abnormalities  No changes in the patient's medications at this time  Continued surveillance is indicated as atherosclerosis is likely to progress with time.    The patient will continue follow up with noninvasive studies as ordered.  - VAS US  ABI WITH/WO TBI; Future  2. Benign essential hypertension Continue antihypertensive medications as already ordered, these medications have been reviewed and there are no changes at this time.  3. Coronary artery disease involving native coronary artery of native heart with angina pectoris (HCC) Continue cardiac and antihypertensive medications as already ordered and reviewed, no changes at this time.  Continue statin as ordered and reviewed, no changes at this time  Nitrates PRN for chest pain  4.  Controlled type 2 diabetes mellitus without complication, without long-term current use of insulin  (HCC) Continue hypoglycemic medications as already ordered, these medications have been reviewed and there are no changes at this time.  Hgb A1C to be monitored as already arranged by primary service  5. Hyperlipidemia, mixed Continue statin as ordered and reviewed, no changes at this time    Cordella Shawl, MD  06/05/2024 7:40 AM

## 2024-06-10 ENCOUNTER — Ambulatory Visit (INDEPENDENT_AMBULATORY_CARE_PROVIDER_SITE_OTHER): Admitting: Vascular Surgery

## 2024-06-10 ENCOUNTER — Encounter (INDEPENDENT_AMBULATORY_CARE_PROVIDER_SITE_OTHER): Payer: Self-pay | Admitting: Vascular Surgery

## 2024-06-10 ENCOUNTER — Ambulatory Visit (INDEPENDENT_AMBULATORY_CARE_PROVIDER_SITE_OTHER)

## 2024-06-10 VITALS — BP 129/71 | HR 60 | Resp 18 | Wt 203.4 lb

## 2024-06-10 DIAGNOSIS — I70213 Atherosclerosis of native arteries of extremities with intermittent claudication, bilateral legs: Secondary | ICD-10-CM

## 2024-06-10 DIAGNOSIS — E782 Mixed hyperlipidemia: Secondary | ICD-10-CM

## 2024-06-10 DIAGNOSIS — I739 Peripheral vascular disease, unspecified: Secondary | ICD-10-CM | POA: Diagnosis not present

## 2024-06-10 DIAGNOSIS — Z9889 Other specified postprocedural states: Secondary | ICD-10-CM | POA: Diagnosis not present

## 2024-06-10 DIAGNOSIS — E119 Type 2 diabetes mellitus without complications: Secondary | ICD-10-CM | POA: Diagnosis not present

## 2024-06-10 DIAGNOSIS — I10 Essential (primary) hypertension: Secondary | ICD-10-CM | POA: Diagnosis not present

## 2024-06-10 DIAGNOSIS — I25119 Atherosclerotic heart disease of native coronary artery with unspecified angina pectoris: Secondary | ICD-10-CM | POA: Diagnosis not present

## 2024-06-10 LAB — VAS US ABI WITH/WO TBI
Left ABI: 1.01
Right ABI: 1.07

## 2024-06-19 DIAGNOSIS — R001 Bradycardia, unspecified: Secondary | ICD-10-CM | POA: Diagnosis not present

## 2024-06-19 DIAGNOSIS — R079 Chest pain, unspecified: Secondary | ICD-10-CM | POA: Diagnosis not present

## 2024-06-19 DIAGNOSIS — I25118 Atherosclerotic heart disease of native coronary artery with other forms of angina pectoris: Secondary | ICD-10-CM | POA: Diagnosis not present

## 2024-06-19 DIAGNOSIS — E782 Mixed hyperlipidemia: Secondary | ICD-10-CM | POA: Diagnosis not present

## 2024-06-19 DIAGNOSIS — I10 Essential (primary) hypertension: Secondary | ICD-10-CM | POA: Diagnosis not present

## 2024-06-19 DIAGNOSIS — Z955 Presence of coronary angioplasty implant and graft: Secondary | ICD-10-CM | POA: Diagnosis not present

## 2024-06-19 DIAGNOSIS — Z720 Tobacco use: Secondary | ICD-10-CM | POA: Diagnosis not present

## 2024-06-25 DIAGNOSIS — Z955 Presence of coronary angioplasty implant and graft: Secondary | ICD-10-CM | POA: Diagnosis not present

## 2024-06-25 DIAGNOSIS — I25118 Atherosclerotic heart disease of native coronary artery with other forms of angina pectoris: Secondary | ICD-10-CM | POA: Diagnosis not present

## 2024-06-25 DIAGNOSIS — R0789 Other chest pain: Secondary | ICD-10-CM | POA: Diagnosis not present

## 2024-07-02 DIAGNOSIS — I739 Peripheral vascular disease, unspecified: Secondary | ICD-10-CM | POA: Diagnosis not present

## 2024-07-02 DIAGNOSIS — Z955 Presence of coronary angioplasty implant and graft: Secondary | ICD-10-CM | POA: Diagnosis not present

## 2024-07-02 DIAGNOSIS — R001 Bradycardia, unspecified: Secondary | ICD-10-CM | POA: Diagnosis not present

## 2024-07-02 DIAGNOSIS — I10 Essential (primary) hypertension: Secondary | ICD-10-CM | POA: Diagnosis not present

## 2024-07-02 DIAGNOSIS — E119 Type 2 diabetes mellitus without complications: Secondary | ICD-10-CM | POA: Diagnosis not present

## 2024-07-02 DIAGNOSIS — Z720 Tobacco use: Secondary | ICD-10-CM | POA: Diagnosis not present

## 2024-07-02 DIAGNOSIS — E782 Mixed hyperlipidemia: Secondary | ICD-10-CM | POA: Diagnosis not present

## 2024-07-02 DIAGNOSIS — I25118 Atherosclerotic heart disease of native coronary artery with other forms of angina pectoris: Secondary | ICD-10-CM | POA: Diagnosis not present

## 2024-09-26 DIAGNOSIS — M5416 Radiculopathy, lumbar region: Secondary | ICD-10-CM | POA: Diagnosis not present

## 2024-09-26 DIAGNOSIS — M48062 Spinal stenosis, lumbar region with neurogenic claudication: Secondary | ICD-10-CM | POA: Diagnosis not present

## 2024-09-26 DIAGNOSIS — M5126 Other intervertebral disc displacement, lumbar region: Secondary | ICD-10-CM | POA: Diagnosis not present

## 2024-10-03 DIAGNOSIS — H903 Sensorineural hearing loss, bilateral: Secondary | ICD-10-CM | POA: Diagnosis not present

## 2024-10-03 DIAGNOSIS — H6123 Impacted cerumen, bilateral: Secondary | ICD-10-CM | POA: Diagnosis not present

## 2024-10-17 ENCOUNTER — Ambulatory Visit: Admitting: Dermatology

## 2024-10-17 ENCOUNTER — Encounter: Payer: Self-pay | Admitting: Dermatology

## 2024-10-17 DIAGNOSIS — Q828 Other specified congenital malformations of skin: Secondary | ICD-10-CM | POA: Diagnosis not present

## 2024-10-17 DIAGNOSIS — L570 Actinic keratosis: Secondary | ICD-10-CM

## 2024-10-17 DIAGNOSIS — L821 Other seborrheic keratosis: Secondary | ICD-10-CM

## 2024-10-17 DIAGNOSIS — L989 Disorder of the skin and subcutaneous tissue, unspecified: Secondary | ICD-10-CM | POA: Diagnosis not present

## 2024-10-17 DIAGNOSIS — L578 Other skin changes due to chronic exposure to nonionizing radiation: Secondary | ICD-10-CM

## 2024-10-17 DIAGNOSIS — Z1283 Encounter for screening for malignant neoplasm of skin: Secondary | ICD-10-CM | POA: Diagnosis not present

## 2024-10-17 DIAGNOSIS — Z86018 Personal history of other benign neoplasm: Secondary | ICD-10-CM

## 2024-10-17 DIAGNOSIS — D1801 Hemangioma of skin and subcutaneous tissue: Secondary | ICD-10-CM | POA: Diagnosis not present

## 2024-10-17 DIAGNOSIS — S60512A Abrasion of left hand, initial encounter: Secondary | ICD-10-CM | POA: Diagnosis not present

## 2024-10-17 DIAGNOSIS — W908XXA Exposure to other nonionizing radiation, initial encounter: Secondary | ICD-10-CM

## 2024-10-17 DIAGNOSIS — T148XXA Other injury of unspecified body region, initial encounter: Secondary | ICD-10-CM

## 2024-10-17 DIAGNOSIS — D229 Melanocytic nevi, unspecified: Secondary | ICD-10-CM

## 2024-10-17 DIAGNOSIS — L814 Other melanin hyperpigmentation: Secondary | ICD-10-CM

## 2024-10-17 DIAGNOSIS — Z86006 Personal history of melanoma in-situ: Secondary | ICD-10-CM | POA: Diagnosis not present

## 2024-10-17 MED ORDER — FLUOROURACIL 5 % EX CREA: TOPICAL_CREAM | CUTANEOUS | 2 refills | Status: AC

## 2024-10-17 NOTE — Patient Instructions (Addendum)
 After Holidays: - Start 5-fluorouracil cream twice a day until reaction occurs to affected areas including temples, sideburns, ears, nose, arms, backs of hands. Treat one area at a time.    Reviewed course of treatment and expected reaction.  Patient advised to expect inflammation and crusting and advised that erosions are possible.  Patient advised to be diligent with sun protection during and after treatment. Handout with details of how to apply medication and what to expect provided. Counseled to keep medication out of reach of children and pets.       Recommend daily broad spectrum sunscreen SPF 30+ to sun-exposed areas, reapply every 2 hours as needed. Call for new or changing lesions.  Staying in the shade or wearing long sleeves, sun glasses (UVA+UVB protection) and wide brim hats (4-inch brim around the entire circumference of the hat) are also recommended for sun protection.      Melanoma ABCDEs  Melanoma is the most dangerous type of skin cancer, and is the leading cause of death from skin disease.  You are more likely to develop melanoma if you: Have light-colored skin, light-colored eyes, or red or blond hair Spend a lot of time in the sun Tan regularly, either outdoors or in a tanning bed Have had blistering sunburns, especially during childhood Have a close family member who has had a melanoma Have atypical moles or large birthmarks  Early detection of melanoma is key since treatment is typically straightforward and cure rates are extremely high if we catch it early.   The first sign of melanoma is often a change in a mole or a new dark spot.  The ABCDE system is a way of remembering the signs of melanoma.  A for asymmetry:  The two halves do not match. B for border:  The edges of the growth are irregular. C for color:  A mixture of colors are present instead of an even brown color. D for diameter:  Melanomas are usually (but not always) greater than 6mm - the size of a  pencil eraser. E for evolution:  The spot keeps changing in size, shape, and color.  Please check your skin once per month between visits. You can use a small mirror in front and a large mirror behind you to keep an eye on the back side or your body.   If you see any new or changing lesions before your next follow-up, please call to schedule a visit.  Please continue daily skin protection including broad spectrum sunscreen SPF 30+ to sun-exposed areas, reapplying every 2 hours as needed when you're outdoors.   Staying in the shade or wearing long sleeves, sun glasses (UVA+UVB protection) and wide brim hats (4-inch brim around the entire circumference of the hat) are also recommended for sun protection.      Due to recent changes in healthcare laws, you may see results of your pathology and/or laboratory studies on MyChart before the doctors have had a chance to review them. We understand that in some cases there may be results that are confusing or concerning to you. Please understand that not all results are received at the same time and often the doctors may need to interpret multiple results in order to provide you with the best plan of care or course of treatment. Therefore, we ask that you please give us  2 business days to thoroughly review all your results before contacting the office for clarification. Should we see a critical lab result, you will be contacted sooner.  If You Need Anything After Your Visit  If you have any questions or concerns for your doctor, please call our main line at [phone] and press option 4 to reach your doctor's medical assistant. If no one answers, please leave a voicemail as directed and we will return your call as soon as possible. Messages left after 4 pm will be answered the following business day.   You may also send us  a message via MyChart. We typically respond to MyChart messages within 1-2 business days.  For prescription refills, please ask  your pharmacy to contact our office. Our fax number is [phone].  If you have an urgent issue when the clinic is closed that cannot wait until the next business day, you can page your doctor at the number below.    Please note that while we do our best to be available for urgent issues outside of office hours, we are not available 24/7.   If you have an urgent issue and are unable to reach us , you may choose to seek medical care at your doctor's office, retail clinic, urgent care center, or emergency room.  If you have a medical emergency, please immediately call 911 or go to the emergency department.  Pager Numbers  - Dr. Hester: [phone]  - Dr. Jackquline: [phone]  - Dr. Claudene: [phone]   - Dr. Raymund: [phone]  In the event of inclement weather, please call our main line at [phone] for an update on the status of any delays or closures.  Dermatology Medication Tips: Please keep the boxes that topical medications come in in order to help keep track of the instructions about where and how to use these. Pharmacies typically print the medication instructions only on the boxes and not directly on the medication tubes.   If your medication is too expensive, please contact our office at [phone] option 4 or send us  a message through MyChart.   We are unable to tell what your co-pay for medications will be in advance as this is different depending on your insurance coverage. However, we may be able to find a substitute medication at lower cost or fill out paperwork to get insurance to cover a needed medication.   If a prior authorization is required to get your medication covered by your insurance company, please allow us  1-2 business days to complete this process.  Drug prices often vary depending on where the prescription is filled and some pharmacies may offer cheaper prices.  The website www.goodrx.com contains coupons for medications through different  pharmacies. The prices here do not account for what the cost may be with help from insurance (it may be cheaper with your insurance), but the website can give you the price if you did not use any insurance.  - You can print the associated coupon and take it with your prescription to the pharmacy.  - You may also stop by our office during regular business hours and pick up a GoodRx coupon card.  - If you need your prescription sent electronically to a different pharmacy, notify our office through [facility] or by phone at [phone] option 4.     Si Usted Necesita Algo Despus de Su Visita  Tambin puede enviarnos un mensaje a travs de Clinical Cytogeneticist. Por lo general respondemos a los mensajes de MyChart en el transcurso de 1 a 2 das hbiles.  Para renovar recetas, por favor pida a su farmacia que se ponga en contacto con nuestra oficina. Nuestro nmero de  fax es el [phone].  Si tiene un asunto urgente cuando la clnica est cerrada y que no puede esperar hasta el siguiente da hbil, puede llamar/localizar a su doctor(a) al nmero que aparece a continuacin.   Por favor, tenga en cuenta que aunque hacemos todo lo posible para estar disponibles para asuntos urgentes fuera del horario de [place], no estamos disponibles las 24 horas del da, los 7 [address]  [address] de la [place].   Si tiene un problema urgente y no puede comunicarse con nosotros, puede optar por buscar atencin mdica  en el consultorio de su doctor(a), en una clnica privada, en un centro de atencin urgente o en una sala de emergencias.  Si tiene engineer, drilling, por favor llame inmediatamente al 911 o vaya a la sala de emergencias.  Nmeros de bper  - Dr. Hester: [phone]  - Dra. Jackquline: [phone]  - Dr. Claudene: [phone]  - Dra. Kitts: [phone]  En caso de inclemencias del [place], por favor llame a nuestra lnea principal al [phone] para una actualizacin sobre el estado de cualquier retraso o  cierre.  Consejos para la medicacin en dermatologa: Por favor, guarde las cajas en las que vienen los medicamentos de uso tpico para ayudarle a seguir las instrucciones sobre dnde y cmo usarlos. Las farmacias generalmente imprimen las instrucciones del medicamento slo en las cajas y no directamente en los tubos del [place].   Si su medicamento es muy caro, por favor, pngase en contacto con landry rieger llamando al [phone] y presione la opcin 4 o envenos un mensaje a travs de Clinical Cytogeneticist.   No podemos decirle cul ser su copago por los medicamentos por adelantado ya que esto es diferente dependiendo de la cobertura de su seguro. Sin embargo, es posible que podamos encontrar un medicamento sustituto a audiological scientist un formulario para que el seguro cubra el medicamento que se considera necesario.   Si se requiere una autorizacin previa para que su compaa de seguros cubra su medicamento, por favor permtanos de 1 a 2 das hbiles para completar este proceso.  Los precios de los medicamentos varan con frecuencia dependiendo del environmental consultant de dnde se surte la receta y alguna farmacias pueden ofrecer precios ms baratos.  El sitio web www.goodrx.com tiene cupones para medicamentos de health and safety inspector. Los precios aqu no tienen en cuenta lo que podra costar con la ayuda del seguro (puede ser ms barato con su seguro), pero el sitio web puede darle el precio si no utiliz tourist information centre manager.  - Puede imprimir el cupn correspondiente y llevarlo con su receta a la farmacia.  - Tambin puede pasar por nuestra oficina durante el horario de atencin regular y education officer, museum una tarjeta de cupones de GoodRx.  - Si necesita que su receta se enve electrnicamente a una farmacia diferente, informe a nuestra oficina a travs de MyChart de [place] o por telfono llamando al [phone] y presione la opcin 4.

## 2024-10-17 NOTE — Progress Notes (Signed)
 Follow-Up Visit   Subjective  ECHO ALLSBROOK is a 69 y.o. male who presents for the following: Skin Cancer Screening and Full Body Skin Exam. Hx of MIS, HxDN, HxAKs.  The patient presents for Total-Body Skin Exam (TBSE) for skin cancer screening and mole check. The patient has spots, moles and lesions to be evaluated, some may be new or changing and the patient may have concern these could be cancer.   The following portions of the chart were reviewed this encounter and updated as appropriate: medications, allergies, medical history  Review of Systems:  No other skin or systemic complaints except as noted in HPI or Assessment and Plan.  Objective  Well appearing patient in no apparent distress; mood and affect are within normal limits.  A full examination was performed including scalp, head, eyes, ears, nose, lips, neck, chest, axillae, abdomen, back, buttocks, bilateral upper extremities, bilateral lower extremities, hands, feet, fingers, toes, fingernails, and toenails. All findings within normal limits unless otherwise noted below.   Relevant physical exam findings are noted in the Assessment and Plan.    Assessment & Plan   SKIN CANCER SCREENING PERFORMED TODAY.  ACTINIC DAMAGE WITH PRECANCEROUS ACTINIC KERATOSES Counseling for Topical Chemotherapy Management: Patient exhibits: - Severe, confluent actinic changes with pre-cancerous actinic keratoses that is secondary to cumulative UV radiation exposure over time - Condition that is severe; chronic, not at goal. - diffuse scaly erythematous macules and papules with underlying dyspigmentation - Discussed Prescription Field Treatment topical Chemotherapy for Severe, Chronic Confluent Actinic Changes with Pre-Cancerous Actinic Keratoses Field treatment involves treatment of an entire area of skin that has confluent Actinic Changes (Sun/ Ultraviolet light damage) and PreCancerous Actinic Keratoses by method of PhotoDynamic  Therapy (PDT) and/or prescription Topical Chemotherapy agents such as 5-fluorouracil, 5-fluorouracil/calcipotriene, and/or imiquimod.  The purpose is to decrease the number of clinically evident and subclinical PreCancerous lesions to prevent progression to development of skin cancer by chemically destroying early precancer changes that may or may not be visible.  It has been shown to reduce the risk of developing skin cancer in the treated area. As a result of treatment, redness, scaling, crusting, and open sores may occur during treatment course. One or more than one of these methods may be used and may have to be used several times to control, suppress and eliminate the PreCancerous changes. Discussed treatment course, expected reaction, and possible side effects. - Recommend daily broad spectrum sunscreen SPF 30+ to sun-exposed areas, reapply every 2 hours as needed.  - Staying in the shade or wearing long sleeves, sun glasses (UVA+UVB protection) and wide brim hats (4-inch brim around the entire circumference of the hat) are also recommended. - Call for new or changing lesions.   After Holidays: - Start 5-fluorouracil cream twice a day until reaction occurs to affected areas including temples, sideburns, ears, nose, arms, backs of hands. Treat one area at a time.    Reviewed course of treatment and expected reaction.  Patient advised to expect inflammation and crusting and advised that erosions are possible.  Patient advised to be diligent with sun protection during and after treatment. Handout with details of how to apply medication and what to expect provided. Counseled to keep medication out of reach of children and pets.   LENTIGINES, SEBORRHEIC KERATOSES, HEMANGIOMAS - Benign normal skin lesions - Benign-appearing - Call for any changes  MELANOCYTIC NEVI - Tan-brown and/or pink-flesh-colored symmetric macules and papules - Benign appearing on exam today - Observation - Call  clinic for new  or changing moles - Recommend daily use of broad spectrum spf 30+ sunscreen to sun-exposed areas.   HISTORY OF MELANOMA IN SITU. Left cheek. Mohs 04/09/2020. - No evidence of recurrence today - No lymphadenopathy - Recommend regular full body skin exams - Recommend daily broad spectrum sunscreen SPF 30+ to sun-exposed areas, reapply every 2 hours as needed.  - Call if any new or changing lesions are noted between office visits    HISTORY OF DYSPLASTIC NEVI. Multiple, see history.  No evidence of recurrence today Recommend regular full body skin exams Recommend daily broad spectrum sunscreen SPF 30+ to sun-exposed areas, reapply every 2 hours as needed.  Call if any new or changing lesions are noted between office visits    Porokeratosis  Exam: annular scaly pink plaque with erosions from trauma on left lower leg Treatment:  Benign-appearing.  Observation.  Call clinic for new or changing lesions.  Recommend daily use of broad spectrum spf 30+ sunscreen to sun-exposed areas.    Lentigo vs other   Exam: medium brown macule at right chest.  Patient denies changes. States has been here at least 1 year.    Treatment: The patient will observe these symptoms, and report promptly any changes.    EXCORIATIONS, secondary to fence work trauma Exam: Excoriations at left hand  Treatment Plan: Recommend vaseline. Call if not resolving.   MULTIPLE BENIGN NEVI   LENTIGINES   ACTINIC ELASTOSIS   ACTINIC KERATOSES   Related Medications fluorouracil (EFUDEX) 5 % cream Apply twice a day until reaction occurs to affected areas including face, ears, nose, arms, backs of hands. Treat one area at a time SEBORRHEIC KERATOSES   CHERRY ANGIOMA   EXCORIATION     Return in about 6 months (around 04/17/2025) for TBSE, HxMIS, HxDN.  I, Jill Parcell, CMA, am acting as scribe for Boneta Sharps, MD.    Documentation: I have reviewed the above documentation for accuracy and  completeness, and I agree with the above.  Boneta Sharps, MD

## 2024-10-18 NOTE — Progress Notes (Signed)
 DEERIC CRUISE                                          MRN: [id number]   10/18/2024   The [facility] Quality Team Specialist reviewed this patient medical record for the purposes of chart review for care gap closure. The following were reviewed: chart review for care gap closure-glycemic status assessment.    [facility] Quality Team

## 2025-02-05 ENCOUNTER — Ambulatory Visit: Admitting: Urology

## 2025-04-17 ENCOUNTER — Ambulatory Visit: Admitting: Dermatology

## 2025-06-12 ENCOUNTER — Encounter (INDEPENDENT_AMBULATORY_CARE_PROVIDER_SITE_OTHER)

## 2025-06-12 ENCOUNTER — Ambulatory Visit (INDEPENDENT_AMBULATORY_CARE_PROVIDER_SITE_OTHER): Admitting: Vascular Surgery
